# Patient Record
Sex: Female | Born: 1940
Health system: Southern US, Community
[De-identification: ages and names within clinical notes are randomized; demographics above are authoritative.]

## PROBLEM LIST (undated history)

## (undated) DIAGNOSIS — E7800 Pure hypercholesterolemia, unspecified: Secondary | ICD-10-CM

## (undated) DIAGNOSIS — H9190 Unspecified hearing loss, unspecified ear: Secondary | ICD-10-CM

## (undated) DIAGNOSIS — J449 Chronic obstructive pulmonary disease, unspecified: Secondary | ICD-10-CM

## (undated) DIAGNOSIS — J302 Other seasonal allergic rhinitis: Secondary | ICD-10-CM

## (undated) DIAGNOSIS — K219 Gastro-esophageal reflux disease without esophagitis: Secondary | ICD-10-CM

## (undated) DIAGNOSIS — M1990 Unspecified osteoarthritis, unspecified site: Secondary | ICD-10-CM

## (undated) DIAGNOSIS — R0600 Dyspnea, unspecified: Secondary | ICD-10-CM

## (undated) HISTORY — PX: MINOR HEMORRHOIDECTOMY: SHX6238

---

## 2007-06-02 ENCOUNTER — Ambulatory Visit: Payer: Self-pay

## 2007-06-15 ENCOUNTER — Ambulatory Visit: Payer: Self-pay

## 2008-08-23 ENCOUNTER — Ambulatory Visit: Payer: Self-pay

## 2008-09-07 LAB — HM PAP SMEAR: HM Pap smear: NORMAL

## 2009-08-26 ENCOUNTER — Ambulatory Visit: Payer: Self-pay | Admitting: Unknown Physician Specialty

## 2010-09-26 ENCOUNTER — Ambulatory Visit: Payer: Self-pay | Admitting: Unknown Physician Specialty

## 2011-12-16 ENCOUNTER — Ambulatory Visit: Payer: Self-pay | Admitting: Family Medicine

## 2012-07-20 LAB — HM COLONOSCOPY

## 2013-01-19 ENCOUNTER — Ambulatory Visit: Payer: Self-pay | Admitting: Family Medicine

## 2014-02-06 ENCOUNTER — Ambulatory Visit: Payer: Self-pay | Admitting: Family Medicine

## 2014-02-06 LAB — HM MAMMOGRAPHY: HM Mammogram: NORMAL

## 2014-07-15 DIAGNOSIS — H9193 Unspecified hearing loss, bilateral: Secondary | ICD-10-CM | POA: Insufficient documentation

## 2014-07-23 ENCOUNTER — Ambulatory Visit: Payer: Self-pay | Admitting: Family Medicine

## 2014-10-11 DIAGNOSIS — J449 Chronic obstructive pulmonary disease, unspecified: Secondary | ICD-10-CM | POA: Insufficient documentation

## 2014-12-10 DIAGNOSIS — K219 Gastro-esophageal reflux disease without esophagitis: Secondary | ICD-10-CM | POA: Diagnosis not present

## 2014-12-10 DIAGNOSIS — J439 Emphysema, unspecified: Secondary | ICD-10-CM | POA: Diagnosis not present

## 2014-12-10 DIAGNOSIS — J302 Other seasonal allergic rhinitis: Secondary | ICD-10-CM | POA: Diagnosis not present

## 2014-12-10 DIAGNOSIS — E785 Hyperlipidemia, unspecified: Secondary | ICD-10-CM | POA: Diagnosis not present

## 2014-12-10 DIAGNOSIS — Z1239 Encounter for other screening for malignant neoplasm of breast: Secondary | ICD-10-CM | POA: Diagnosis not present

## 2014-12-10 DIAGNOSIS — M1611 Unilateral primary osteoarthritis, right hip: Secondary | ICD-10-CM | POA: Diagnosis not present

## 2014-12-12 ENCOUNTER — Encounter: Payer: Self-pay | Admitting: Internal Medicine

## 2014-12-12 DIAGNOSIS — E782 Mixed hyperlipidemia: Secondary | ICD-10-CM | POA: Insufficient documentation

## 2014-12-12 DIAGNOSIS — M169 Osteoarthritis of hip, unspecified: Secondary | ICD-10-CM | POA: Insufficient documentation

## 2014-12-12 DIAGNOSIS — K219 Gastro-esophageal reflux disease without esophagitis: Secondary | ICD-10-CM | POA: Insufficient documentation

## 2014-12-12 DIAGNOSIS — J302 Other seasonal allergic rhinitis: Secondary | ICD-10-CM | POA: Insufficient documentation

## 2014-12-12 DIAGNOSIS — Z1239 Encounter for other screening for malignant neoplasm of breast: Secondary | ICD-10-CM | POA: Insufficient documentation

## 2015-01-25 ENCOUNTER — Other Ambulatory Visit: Payer: Self-pay | Admitting: Internal Medicine

## 2015-01-25 DIAGNOSIS — Z1231 Encounter for screening mammogram for malignant neoplasm of breast: Secondary | ICD-10-CM

## 2015-01-29 DIAGNOSIS — S39012A Strain of muscle, fascia and tendon of lower back, initial encounter: Secondary | ICD-10-CM | POA: Diagnosis not present

## 2015-01-29 DIAGNOSIS — M7071 Other bursitis of hip, right hip: Secondary | ICD-10-CM | POA: Diagnosis not present

## 2015-02-13 ENCOUNTER — Ambulatory Visit
Admission: RE | Admit: 2015-02-13 | Discharge: 2015-02-13 | Disposition: A | Discharge status: Home / Self Care | Payer: Commercial Managed Care - HMO | Source: Ambulatory Visit | Attending: Internal Medicine | Admitting: Internal Medicine

## 2015-02-13 DIAGNOSIS — Z1231 Encounter for screening mammogram for malignant neoplasm of breast: Secondary | ICD-10-CM | POA: Diagnosis not present

## 2015-03-06 ENCOUNTER — Other Ambulatory Visit: Payer: Self-pay | Admitting: Internal Medicine

## 2015-03-12 ENCOUNTER — Ambulatory Visit (INDEPENDENT_AMBULATORY_CARE_PROVIDER_SITE_OTHER): Payer: Commercial Managed Care - HMO | Admitting: Internal Medicine

## 2015-03-12 ENCOUNTER — Encounter: Payer: Self-pay | Admitting: Internal Medicine

## 2015-03-12 VITALS — BP 150/70 | HR 68 | Ht 62.0 in | Wt 182.4 lb

## 2015-03-12 DIAGNOSIS — J309 Allergic rhinitis, unspecified: Secondary | ICD-10-CM | POA: Diagnosis not present

## 2015-03-12 DIAGNOSIS — J439 Emphysema, unspecified: Secondary | ICD-10-CM | POA: Diagnosis not present

## 2015-03-12 DIAGNOSIS — Z0000 Encounter for general adult medical examination without abnormal findings: Secondary | ICD-10-CM

## 2015-03-12 DIAGNOSIS — Z1239 Encounter for other screening for malignant neoplasm of breast: Secondary | ICD-10-CM | POA: Diagnosis not present

## 2015-03-12 DIAGNOSIS — E785 Hyperlipidemia, unspecified: Secondary | ICD-10-CM

## 2015-03-12 DIAGNOSIS — K219 Gastro-esophageal reflux disease without esophagitis: Secondary | ICD-10-CM | POA: Diagnosis not present

## 2015-03-12 DIAGNOSIS — M1611 Unilateral primary osteoarthritis, right hip: Secondary | ICD-10-CM

## 2015-03-12 LAB — POCT URINALYSIS DIPSTICK
BILIRUBIN UA: NEGATIVE
Blood, UA: NEGATIVE
GLUCOSE UA: NEGATIVE
KETONES UA: NEGATIVE
Leukocytes, UA: NEGATIVE
NITRITE UA: NEGATIVE
Protein, UA: NEGATIVE
Spec Grav, UA: 1.01
Urobilinogen, UA: 0.2
pH, UA: 5

## 2015-03-12 MED ORDER — ALBUTEROL SULFATE HFA 108 (90 BASE) MCG/ACT IN AERS: 2.0000 | INHALATION_SPRAY | Freq: Four times a day (QID) | RESPIRATORY_TRACT | Status: DC | PRN

## 2015-03-12 MED ORDER — FLUTICASONE PROPIONATE 50 MCG/ACT NA SUSP: 2.0000 | Freq: Every day | NASAL | Status: DC

## 2015-03-12 MED ORDER — OMEPRAZOLE 20 MG PO CPDR: 20.0000 mg | DELAYED_RELEASE_CAPSULE | Freq: Every day | ORAL | Status: DC

## 2015-03-12 MED ORDER — FLUTICASONE-SALMETEROL 250-50 MCG/DOSE IN AEPB: 1.0000 | INHALATION_SPRAY | Freq: Two times a day (BID) | RESPIRATORY_TRACT | Status: DC

## 2015-03-12 NOTE — Progress Notes (Signed)
Patient: Monica Campbell, Female    DOB: March 04, 1941, 74 y.o.   MRN: [id number] Visit Date: 03/12/2015  Today's Provider: Halina Maidens, MD   Chief Complaint  Patient presents with  . Medicare Wellness   Subjective:    Annual wellness visit Monica Campbell is a 74 y.o. female who presents today for her Subsequent Annual Wellness Visit. She feels well. She reports exercising by walking every day and sometimes going to the gym. She reports she is sleeping well.   ----------------------------------------------------------- Hip Pain  The incident occurred more than 1 week ago. There was no injury mechanism. The pain is present in the right hip. The quality of the pain is described as aching. The pain is moderate. The pain has been intermittent since onset. Associated symptoms include a loss of motion. The symptoms are aggravated by weight bearing. She has tried NSAIDs for the symptoms.  Shortness of Breath This is a chronic problem. The current episode started more than 1 year ago. The problem occurs daily. The problem has been unchanged. Pertinent negatives include no abdominal pain, chest pain, ear pain, fever, headaches, leg swelling, rash, sore throat or swollen glands. She has tried beta agonist inhalers and steroid inhalers (using advair once a day and albuterol mdi 2-3 times per day) for the symptoms. The treatment provided moderate relief. Her past medical history is significant for COPD.  Gastrophageal Reflux She complains of belching and heartburn. She reports no abdominal pain, no chest pain, no choking, no coughing, no nausea or no sore throat. This is a recurrent problem. The problem occurs occasionally. The problem has been unchanged. The heartburn duration is several minutes. The heartburn is located in the substernum. The heartburn is of moderate intensity. The heartburn does not wake her from sleep. The heartburn does not limit her activity. The heartburn doesn't change with position.  The symptoms are aggravated by certain foods. Pertinent negatives include no fatigue or weight loss. There are no known risk factors. She has tried an antacid for the symptoms. The treatment provided mild relief.  Hyperlipidemia This is a chronic problem. The problem is uncontrolled. She has no history of diabetes. There are no known factors aggravating her hyperlipidemia. Associated symptoms include myalgias (lateral right calf after prolonged walking) and shortness of breath. Pertinent negatives include no chest pain. She is currently on no antihyperlipidemic treatment (myalgia on lovestatin so meds stopped last visit). Compliance problems include medication side effects.     Review of Systems  Constitutional: Negative for fever, weight loss, appetite change, fatigue and unexpected weight change.  HENT: Negative for ear pain, sore throat, trouble swallowing and voice change.   Eyes: Negative for visual disturbance.  Respiratory: Positive for shortness of breath. Negative for cough, choking and chest tightness.   Cardiovascular: Negative for chest pain, palpitations and leg swelling.  Gastrointestinal: Positive for heartburn. Negative for nausea, abdominal pain, diarrhea and blood in stool.  Endocrine: Negative for polydipsia and polyuria.  Genitourinary: Negative for dysuria and hematuria.  Musculoskeletal: Positive for myalgias (lateral right calf after prolonged walking) and arthralgias (right hip pain).  Skin: Negative for color change and rash.  Neurological: Negative for weakness, light-headedness and headaches.  Hematological: Negative for adenopathy.  Psychiatric/Behavioral: Negative for confusion, sleep disturbance and dysphoric mood.    History   Social History  . Marital Status: Divorced    Spouse Name: N/A  . Number of Children: N/A  . Years of Education: N/A   Occupational History  . Not on  file.   Social History Main Topics  . Smoking status: Former Smoker    Types:  Cigarettes    Quit date: 09/07/2000  . Smokeless tobacco: Not on file  . Alcohol Use: 0.6 oz/week    1 Glasses of wine per week  . Drug Use: Not on file  . Sexual Activity: Not on file   Other Topics Concern  . Not on file   Social History Narrative    Patient Active Problem List   Diagnosis Date Noted  . Allergic rhinitis, seasonal 12/12/2014  . Breast screening 12/12/2014  . Gastro-esophageal reflux disease without esophagitis 12/12/2014  . HLD (hyperlipidemia) 12/12/2014  . Degenerative arthritis of hip 12/12/2014  . Chronic obstructive pulmonary emphysema 12/12/2014  . Bilateral hearing loss 07/15/2014    Past Surgical History  Procedure Laterality Date  . Minor hemorrhoidectomy      Her family history includes Heart disease in her brother, mother, and sister.    Previous Medications   ALBUTEROL (PROVENTIL HFA;VENTOLIN HFA) 108 (90 BASE) MCG/ACT INHALER    Inhale 2 puffs into the lungs.   ALBUTEROL (PROVENTIL) (2.5 MG/3ML) 0.083% NEBULIZER SOLUTION    Inhale 3 mLs into the lungs 4 (four) times a week.   ASPIRIN 81 MG CHEWABLE TABLET    Chew 1 tablet by mouth daily.   CALCIUM CARBONATE-VIT D-MIN (CALTRATE 600+D PLUS MINERALS) 600-800 MG-UNIT CHEW    Chew 1 tablet by mouth daily.   DOCUSATE SODIUM (COLACE) 100 MG CAPSULE    Take 1 capsule by mouth daily.   FLUTICASONE (FLONASE) 50 MCG/ACT NASAL SPRAY    Place 2 sprays into the nose daily.   FLUTICASONE-SALMETEROL (ADVAIR) 250-50 MCG/DOSE AEPB    Inhale 1 puff into the lungs 2 (two) times daily.   LOVASTATIN (MEVACOR) 20 MG TABLET    Take 1 tablet by mouth at bedtime.   MULTIPLE VITAMINS PO    Take 1 tablet by mouth daily.   OMEPRAZOLE (PRILOSEC) 20 MG CAPSULE    Take 1 capsule by mouth daily.   TIOTROPIUM (SPIRIVA HANDIHALER) 18 MCG INHALATION CAPSULE    Place 1 capsule into inhaler and inhale daily.    Patient Care Team: Glean Hess, MD as PCP - General (Family Medicine)     Objective:   Vitals: BP 150/70  mmHg  Pulse 68  Ht 5\' 2"  (1.575 m)  Wt 182 lb 6.4 oz (82.736 kg)  BMI 33.35 kg/m2  Physical Exam  Constitutional: She is oriented to person, place, and time. She appears well-developed and well-nourished.  HENT:  Head: Normocephalic.  Right Ear: Tympanic membrane and ear canal normal.  Left Ear: Tympanic membrane and ear canal normal.  Nose: Nose normal. Right sinus exhibits no maxillary sinus tenderness and no frontal sinus tenderness. Left sinus exhibits no maxillary sinus tenderness and no frontal sinus tenderness.  Mouth/Throat: Uvula is midline.  Eyes: Conjunctivae and lids are normal.  Neck: Normal range of motion. Neck supple. No tracheal tenderness and no muscular tenderness present. Carotid bruit is not present. No thyromegaly present.  Cardiovascular: Normal rate, regular rhythm, normal heart sounds and intact distal pulses.   Pulmonary/Chest: Breath sounds normal. She has no decreased breath sounds. She has no wheezes.  Abdominal: Soft. Normal appearance and bowel sounds are normal. There is no hepatosplenomegaly. There is no tenderness.  Genitourinary: No breast swelling, tenderness, discharge or bleeding.  Musculoskeletal:       Right hip: She exhibits tenderness. She exhibits normal range of motion.  Legs: Lymphadenopathy:    She has no cervical adenopathy.    She has no axillary adenopathy.  Neurological: She is alert and oriented to person, place, and time. She has normal strength and normal reflexes. No cranial nerve deficit or sensory deficit.  Skin: Skin is warm, dry and intact.  Psychiatric: She has a normal mood and affect. Her speech is normal. Cognition and memory are normal.    Activities of Daily Living In your present state of health, do you have any difficulty performing the following activities: 03/12/2015  Hearing? Y  Vision? N  Difficulty concentrating or making decisions? N  Walking or climbing stairs? Y  Dressing or bathing? N  Doing errands,  shopping? N    Fall Risk Assessment Fall Risk  03/12/2015  Falls in the past year? No     Patient reports there are not safety devices in place in shower at home. She has smoke detectors at home.  Depression Screen PHQ 2/9 Scores 03/12/2015  PHQ - 2 Score 0    Cognitive Testing - 6-CIT   Correct? Score   What year is it? yes 0 Yes = 0    No = 4  What month is it? yes 0 Yes = 0    No = 3  Remember:     [place], [place], [place]     What time is it? yes 0 Yes = 0    No = 3  Count backwards from 20 to 1 yes 0 Correct = 0    1 error = 2   More than 1 error = 4  Say the months of the year in reverse. yes 0 Correct = 0    1 error = 2   More than 1 error = 4  What address did I ask you to remember? yes 2 Correct = 0  1 error = 2    2 error = 4    3 error = 6    4 error = 8    All wrong = 10       TOTAL SCORE  2/28   Interpretation:  Normal  Normal (0-7) Abnormal (8-28)        Assessment & Plan:     Annual Wellness Visit  Reviewed patient's Family Medical History Reviewed and updated list of patient's medical providers Assessment of cognitive impairment was done Assessed patient's functional ability Established a written schedule for health screening [place] Completed and Reviewed  Exercise Activities and Dietary recommendations Goals    None       There is no immunization history on file for this patient.  Health Maintenance  Topic Date Due  . ZOSTAVAX  05/30/2001  . DEXA SCAN  05/30/2006  . PNA vac Low Risk Adult (1 of 2 - PCV13) 05/30/2006  . INFLUENZA VACCINE  04/08/2015  . MAMMOGRAM  02/12/2017  . COLONOSCOPY  07/20/2022  . TETANUS/TDAP  05/17/2024     Discussed health benefits of physical activity, and encouraged her to engage in regular exercise appropriate for her age and condition.    ------------------------------------------------------------------------------------------------------------ 1. Annual physical exam -  TSH - POCT urinalysis dipstick  2. Pulmonary emphysema, unspecified emphysema type Stable but needing MDI 2-3 times per day Pt instructed to increase Advair to BID - albuterol (PROVENTIL HFA;VENTOLIN HFA) 108 (90 BASE) MCG/ACT inhaler; Inhale 2 puffs into the lungs every 6 (six) hours as needed for wheezing or shortness of breath.  Dispense:  3 each; Refill: 3 - Fluticasone-Salmeterol (ADVAIR) 250-50 MCG/DOSE AEPB; Inhale 1 puff into the lungs 2 (two) times daily.  Dispense: 3 each; Refill: 3  3. Gastro-esophageal reflux disease without esophagitis - omeprazole (PRILOSEC) 20 MG capsule; Take 1 capsule (20 mg total) by mouth daily.  Dispense: 90 capsule; Refill: 3 - Comprehensive metabolic panel - CBC with Differential/Platelet  4. Primary osteoarthritis of right hip Continue advil as needed - this will help calf pain from inflamed varicose veins as well Consider Ortho referral for hip pain if worsening  5. Breast screening Continue annual mammograms  6. HLD (hyperlipidemia) Remain off medication - will advise after labs - Lipid panel  7. Allergic rhinitis, unspecified allergic rhinitis type - fluticasone (FLONASE) 50 MCG/ACT nasal spray; Place 2 sprays into both nostrils daily.  Dispense: 48 g; Refill: [place], MD [facility]  03/12/2015

## 2015-03-12 NOTE — Patient Instructions (Addendum)
Health Maintenance  Topic Date Due  . ZOSTAVAX  Completed  . DEXA SCAN  05/30/2006  . PNA vac Low Risk Adult (1 of 2 - PCV13) Completed  . INFLUENZA VACCINE  04/08/2015  . MAMMOGRAM  02/12/2017  . COLONOSCOPY  07/20/2022  . TETANUS/TDAP  05/17/2024   ** USE ADVAIR TWICE A DAY INSTEAD OF ONCE PER DAY**  Breast Self-Awareness Practicing breast self-awareness may pick up problems early, prevent significant medical complications, and possibly save your life. By practicing breast self-awareness, you can become familiar with how your breasts look and feel and if your breasts are changing. This allows you to notice changes early. It can also offer you some reassurance that your breast health is good. One way to learn what is normal for your breasts and whether your breasts are changing is to do a breast self-exam. If you find a lump or something that was not present in the past, it is best to contact your caregiver right away. Other findings that should be evaluated by your caregiver include nipple discharge, especially if it is bloody; skin changes or reddening; areas where the skin seems to be pulled in (retracted); or new lumps and bumps. Breast pain is seldom associated with cancer (malignancy), but should also be evaluated by a caregiver. HOW TO PERFORM A BREAST SELF-EXAM The best time to examine your breasts is 5-7 days after your menstrual period is over. During menstruation, the breasts are lumpier, and it may be more difficult to pick up changes. If you do not menstruate, have reached menopause, or had your uterus removed (hysterectomy), you should examine your breasts at regular intervals, such as monthly. If you are breastfeeding, examine your breasts after a feeding or after using a breast pump. Breast implants do not decrease the risk for lumps or tumors, so continue to perform breast self-exams as recommended. Talk to your caregiver about how to determine the difference between the implant and  breast tissue. Also, talk about the amount of pressure you should use during the exam. Over time, you will become more familiar with the variations of your breasts and more comfortable with the exam. A breast self-exam requires you to remove all your clothes above the waist. 1. Look at your breasts and nipples. Stand in front of a mirror in a room with good lighting. With your hands on your hips, push your hands firmly downward. Look for a difference in shape, contour, and size from one breast to the other (asymmetry). Asymmetry includes puckers, dips, or bumps. Also, look for skin changes, such as reddened or scaly areas on the breasts. Look for nipple changes, such as discharge, dimpling, repositioning, or redness. 2. Carefully feel your breasts. This is best done either in the shower or tub while using soapy water or when flat on your back. Place the arm (on the side of the breast you are examining) above your head. Use the pads (not the fingertips) of your three middle fingers on your opposite hand to feel your breasts. Start in the underarm area and use  inch (2 cm) overlapping circles to feel your breast. Use 3 different levels of pressure (light, medium, and firm pressure) at each circle before moving to the next circle. The light pressure is needed to feel the tissue closest to the skin. The medium pressure will help to feel breast tissue a little deeper, while the firm pressure is needed to feel the tissue close to the ribs. Continue the overlapping circles, moving downward  over the breast until you feel your ribs below your breast. Then, move one finger-width towards the center of the body. Continue to use the  inch (2 cm) overlapping circles to feel your breast as you move slowly up toward the collar bone (clavicle) near the base of the neck. Continue the up and down exam using all 3 pressures until you reach the middle of the chest. Do this with each breast, carefully feeling for lumps or  changes. 3.  Keep a written record with breast changes or normal findings for each breast. By writing this information down, you do not need to depend only on memory for size, tenderness, or location. Write down where you are in your menstrual cycle, if you are still menstruating. Breast tissue can have some lumps or thick tissue. However, see your caregiver if you find anything that concerns you.  SEEK MEDICAL CARE IF:  You see a change in shape, contour, or size of your breasts or nipples.   You see skin changes, such as reddened or scaly areas on the breasts or nipples.   You have an unusual discharge from your nipples.   You feel a new lump or unusually thick areas.  Document Released: 08/24/2005 Document Revised: 08/10/2012 Document Reviewed: 12/09/2011 [facility] Patient Information 2015 [place], [place]. This information is not intended to replace advice given to you by your health care provider. Make sure you discuss any questions you have with your health care provider.

## 2015-03-13 ENCOUNTER — Other Ambulatory Visit: Payer: Self-pay | Admitting: Internal Medicine

## 2015-03-13 LAB — COMPREHENSIVE METABOLIC PANEL
A/G RATIO: 1.4 (ref 1.1–2.5)
ALT: 10 IU/L (ref 0–32)
AST: 17 IU/L (ref 0–40)
Albumin: 4 g/dL (ref 3.5–4.8)
Alkaline Phosphatase: 63 IU/L (ref 39–117)
BUN/Creatinine Ratio: 15 (ref 11–26)
BUN: 11 mg/dL (ref 8–27)
Bilirubin Total: 0.5 mg/dL (ref 0.0–1.2)
CO2: 27 mmol/L (ref 18–29)
CREATININE: 0.71 mg/dL (ref 0.57–1.00)
Calcium: 9 mg/dL (ref 8.7–10.3)
Chloride: 100 mmol/L (ref 97–108)
GFR calc non Af Amer: 85 mL/min/{1.73_m2} (ref >59)
GFR, EST AFRICAN AMERICAN: 98 mL/min/{1.73_m2} (ref >59)
GLOBULIN, TOTAL: 2.8 g/dL (ref 1.5–4.5)
Glucose: 89 mg/dL (ref 65–99)
Potassium: 4.3 mmol/L (ref 3.5–5.2)
Sodium: 141 mmol/L (ref 134–144)
Total Protein: 6.8 g/dL (ref 6.0–8.5)

## 2015-03-13 LAB — CBC WITH DIFFERENTIAL/PLATELET
BASOS ABS: 0 10*3/uL (ref 0.0–0.2)
Basos: 0 %
EOS (ABSOLUTE): 0.2 10*3/uL (ref 0.0–0.4)
Eos: 2 %
Hematocrit: 40.6 % (ref 34.0–46.6)
Hemoglobin: 13.9 g/dL (ref 11.1–15.9)
IMMATURE GRANULOCYTES: 0 %
Immature Grans (Abs): 0 10*3/uL (ref 0.0–0.1)
Lymphocytes Absolute: 3.9 10*3/uL — ABNORMAL HIGH (ref 0.7–3.1)
Lymphs: 36 %
MCH: 29.6 pg (ref 26.6–33.0)
MCHC: 34.2 g/dL (ref 31.5–35.7)
MCV: 87 fL (ref 79–97)
MONOCYTES: 9 %
Monocytes Absolute: 0.9 10*3/uL (ref 0.1–0.9)
NEUTROS ABS: 5.7 10*3/uL (ref 1.4–7.0)
Neutrophils: 53 %
PLATELETS: 289 10*3/uL (ref 150–379)
RBC: 4.69 x10E6/uL (ref 3.77–5.28)
RDW: 13.5 % (ref 12.3–15.4)
WBC: 10.8 10*3/uL (ref 3.4–10.8)

## 2015-03-13 LAB — LIPID PANEL
CHOLESTEROL TOTAL: 271 mg/dL — AB (ref 100–199)
Chol/HDL Ratio: 4.2 ratio units (ref 0.0–4.4)
HDL: 64 mg/dL (ref >39)
LDL Calculated: 181 mg/dL — ABNORMAL HIGH (ref 0–99)
Triglycerides: 128 mg/dL (ref 0–149)
VLDL CHOLESTEROL CAL: 26 mg/dL (ref 5–40)

## 2015-03-13 LAB — TSH: TSH: 4.06 u[IU]/mL (ref 0.450–4.500)

## 2015-03-13 MED ORDER — ALBUTEROL SULFATE HFA 108 (90 BASE) MCG/ACT IN AERS: 2.0000 | INHALATION_SPRAY | Freq: Four times a day (QID) | RESPIRATORY_TRACT | Status: DC | PRN

## 2015-03-13 MED ORDER — SIMVASTATIN 10 MG PO TABS: 10.0000 mg | ORAL_TABLET | Freq: Every day | ORAL | Status: DC

## 2015-05-29 ENCOUNTER — Other Ambulatory Visit: Payer: Self-pay | Admitting: Internal Medicine

## 2015-05-29 DIAGNOSIS — M1611 Unilateral primary osteoarthritis, right hip: Secondary | ICD-10-CM

## 2015-06-04 DIAGNOSIS — M5431 Sciatica, right side: Secondary | ICD-10-CM | POA: Diagnosis not present

## 2015-06-04 DIAGNOSIS — M545 Low back pain: Secondary | ICD-10-CM | POA: Diagnosis not present

## 2015-06-04 DIAGNOSIS — M7071 Other bursitis of hip, right hip: Secondary | ICD-10-CM | POA: Diagnosis not present

## 2015-07-09 ENCOUNTER — Encounter: Payer: Self-pay | Admitting: Internal Medicine

## 2015-07-09 ENCOUNTER — Ambulatory Visit (INDEPENDENT_AMBULATORY_CARE_PROVIDER_SITE_OTHER): Payer: Commercial Managed Care - HMO | Admitting: Internal Medicine

## 2015-07-09 VITALS — BP 110/60 | HR 88 | Temp 98.2°F | Ht 62.0 in | Wt 185.4 lb

## 2015-07-09 DIAGNOSIS — J439 Emphysema, unspecified: Secondary | ICD-10-CM

## 2015-07-09 DIAGNOSIS — J40 Bronchitis, not specified as acute or chronic: Secondary | ICD-10-CM | POA: Diagnosis not present

## 2015-07-09 DIAGNOSIS — L309 Dermatitis, unspecified: Secondary | ICD-10-CM | POA: Diagnosis not present

## 2015-07-09 MED ORDER — CLOBETASOL PROPIONATE 0.05 % EX CREA: 1.0000 "application " | TOPICAL_CREAM | Freq: Two times a day (BID) | CUTANEOUS | Status: DC

## 2015-07-09 MED ORDER — LEVOFLOXACIN 500 MG PO TABS: 500.0000 mg | ORAL_TABLET | Freq: Every day | ORAL | Status: DC

## 2015-07-09 MED ORDER — BENZONATATE 200 MG PO CAPS: 200.0000 mg | ORAL_CAPSULE | Freq: Three times a day (TID) | ORAL | Status: DC | PRN

## 2015-07-09 NOTE — Progress Notes (Signed)
Date:  07/09/2015   Name:  Monica Campbell   DOB:  06-24-1941   MRN:  [id number]   Chief Complaint: Bronchitis  Patient complains of onset of cough with productive phlegm. Symptoms began about 2 weeks ago and her progressive. She has sore throat and chest discomfort from coughing. She may have had some scattered wheezes as well. No fever or chills but she does have fatigue. She continues to use her Advair inhaler as well as Flonase nasal spray. Rash - patient complains of a chronic rash scattered over her back chest and arms. She seemed to separate dermatologist and had a biopsy with no definitive diagnosis. She's been using clobetasol cream for flareups. Currently flared up especially on arms and neck and requests a refill.   Review of Systems  Constitutional: Positive for chills. Negative for fever and fatigue.  HENT: Positive for sore throat. Negative for ear pain, nosebleeds and postnasal drip.   Respiratory: Positive for cough, chest tightness, shortness of breath and wheezing.   Cardiovascular: Negative for chest pain and palpitations.  Gastrointestinal: Positive for abdominal pain. Negative for vomiting and diarrhea.  Skin: Positive for rash. Negative for wound.  Psychiatric/Behavioral: Negative for confusion, sleep disturbance and dysphoric mood.    Patient Active Problem List   Diagnosis Date Noted  . Allergic rhinitis, seasonal 12/12/2014  . Breast screening 12/12/2014  . Gastro-esophageal reflux disease without esophagitis 12/12/2014  . HLD (hyperlipidemia) 12/12/2014  . Degenerative arthritis of hip 12/12/2014  . Chronic obstructive pulmonary emphysema ([facility]) 12/12/2014  . Bilateral hearing loss 07/15/2014    Prior to Admission medications   Medication Sig Start Date End Date Taking? Authorizing Provider  albuterol (PROVENTIL HFA;VENTOLIN HFA) 108 (90 BASE) MCG/ACT inhaler Inhale 2 puffs into the lungs every 6 (six) hours as needed for wheezing or shortness of breath. 03/13/15   Yes Glean Hess, MD  albuterol (PROVENTIL) (2.5 MG/3ML) 0.083% nebulizer solution Inhale 3 mLs into the lungs 4 (four) times a week.   Yes Historical Provider, MD  aspirin 81 MG chewable tablet Chew 1 tablet by mouth daily.   Yes Historical Provider, MD  Calcium Carbonate-Vit D-Min (CALTRATE 600+D PLUS MINERALS) 600-800 MG-UNIT CHEW Chew 1 tablet by mouth daily.   Yes Historical Provider, MD  docusate sodium (COLACE) 100 MG capsule Take 1 capsule by mouth daily.   Yes Historical Provider, MD  fluticasone (FLONASE) 50 MCG/ACT nasal spray Place 2 sprays into both nostrils daily. 03/12/15  Yes Glean Hess, MD  Fluticasone-Salmeterol (ADVAIR) 250-50 MCG/DOSE AEPB Inhale 1 puff into the lungs 2 (two) times daily. 03/12/15  Yes Glean Hess, MD  MULTIPLE VITAMINS PO Take 1 tablet by mouth daily.   Yes Historical Provider, MD  simvastatin (ZOCOR) 10 MG tablet Take 1 tablet (10 mg total) by mouth at bedtime. 03/13/15  Yes Glean Hess, MD  omeprazole (PRILOSEC) 20 MG capsule Take 1 capsule (20 mg total) by mouth daily. Patient not taking: Reported on 07/09/2015 03/12/15   Glean Hess, MD  tiotropium (SPIRIVA HANDIHALER) 18 MCG inhalation capsule Place 1 capsule into inhaler and inhale daily. 11/09/14 11/09/15  Historical Provider, MD    Allergies  Allergen Reactions  . Codeine Rash    Past Surgical History  Procedure Laterality Date  . Minor hemorrhoidectomy      Social History  Substance Use Topics  . Smoking status: Former Smoker    Types: Cigarettes    Quit date: 09/07/2000  . Smokeless tobacco: None  .  Alcohol Use: 0.6 oz/week    1 Glasses of wine per week     Medication list has been reviewed and updated.   Physical Exam  Constitutional: She appears well-developed and well-nourished.  HENT:  Right Ear: Tympanic membrane and ear canal normal.  Left Ear: Tympanic membrane and ear canal normal.  Nose: Nose normal. Right sinus exhibits no maxillary sinus tenderness and  no frontal sinus tenderness. Left sinus exhibits no maxillary sinus tenderness and no frontal sinus tenderness.  Mouth/Throat: Posterior oropharyngeal erythema present.  Neck: Normal range of motion. Neck supple.  Cardiovascular: Normal rate, regular rhythm and normal heart sounds.   Pulmonary/Chest: [place]ort normal. She has decreased breath sounds. She has no wheezes. She has no rhonchi.  Skin:  Scattered erythematous macules over her arms and upper back and neck  Psychiatric: She has a normal mood and affect. Her speech is normal.  Nursing note and vitals reviewed.   BP 110/60 mmHg  Pulse 88  Temp(Src) 98.2 F (36.8 C)  Ht 5\' 2"  (1.575 m)  Wt 185 lb 6.4 oz (84.097 kg)  BMI 33.90 kg/m2  SpO2 95%  Assessment and Plan: 1. Bronchitis - levofloxacin (LEVAQUIN) 500 MG tablet; Take 1 tablet (500 mg total) by mouth daily.  Dispense: 10 tablet; Refill: 0 - benzonatate (TESSALON) 200 MG capsule; Take 1 capsule (200 mg total) by mouth 3 (three) times daily as needed for cough.  Dispense: 30 capsule; Refill: 0  2. Pulmonary emphysema, unspecified emphysema type (HCC) Continue current inhalers - levofloxacin (LEVAQUIN) 500 MG tablet; Take 1 tablet (500 mg total) by mouth daily.  Dispense: 10 tablet; Refill: 0  3. Chronic dermatitis - clobetasol cream (TEMOVATE) 0.05 %; Apply 1 application topically 2 (two) times daily.  Dispense: 30 g; Refill: 0   Halina Maidens, MD [facility]  07/09/2015

## 2015-07-22 ENCOUNTER — Telehealth: Payer: Self-pay

## 2015-07-22 ENCOUNTER — Other Ambulatory Visit: Payer: Self-pay | Admitting: Internal Medicine

## 2015-07-22 DIAGNOSIS — R21 Rash and other nonspecific skin eruption: Secondary | ICD-10-CM

## 2015-07-22 NOTE — Telephone Encounter (Signed)
Patient needs referral to dermatology for rash on back of hands.dr

## 2015-07-24 DIAGNOSIS — L309 Dermatitis, unspecified: Secondary | ICD-10-CM | POA: Diagnosis not present

## 2015-08-05 ENCOUNTER — Encounter: Payer: Self-pay | Admitting: Internal Medicine

## 2015-08-05 ENCOUNTER — Ambulatory Visit (INDEPENDENT_AMBULATORY_CARE_PROVIDER_SITE_OTHER): Payer: Commercial Managed Care - HMO | Admitting: Internal Medicine

## 2015-08-05 VITALS — BP 128/64 | HR 74 | Temp 97.9°F | Ht 62.0 in | Wt 186.4 lb

## 2015-08-05 DIAGNOSIS — J439 Emphysema, unspecified: Secondary | ICD-10-CM

## 2015-08-05 DIAGNOSIS — J40 Bronchitis, not specified as acute or chronic: Secondary | ICD-10-CM | POA: Diagnosis not present

## 2015-08-05 MED ORDER — HYDROCODONE-HOMATROPINE 5-1.5 MG/5ML PO SYRP: 5.0000 mL | ORAL_SOLUTION | Freq: Four times a day (QID) | ORAL | Status: DC | PRN

## 2015-08-05 MED ORDER — AMOXICILLIN-POT CLAVULANATE 875-125 MG PO TABS: 1.0000 | ORAL_TABLET | Freq: Two times a day (BID) | ORAL | Status: DC

## 2015-08-05 NOTE — Progress Notes (Signed)
Date:  08/05/2015   Name:  Monica Campbell   DOB:  1941/02/08   MRN:  [id number]   Chief Complaint: Cough Patient was seen 3 weeks ago with a flare of COPD and bronchitis. She was treated with 10 days of levofloxacin. She continues to complain of cough but did not get Tessalon Perles due to cost. She recently felt better when she finished the 10 days of antibiotic but then gradually began to have worsening symptoms with more fatigue more cough and more sputum production. The sputum and nasal drainage is a cloudy white, not yellow or green.  Review of Systems  Constitutional: Positive for fatigue. Negative for fever, chills and diaphoresis.  HENT: Positive for postnasal drip, rhinorrhea and sore throat. Negative for sinus pressure, trouble swallowing and voice change.   Respiratory: Positive for cough, chest tightness and shortness of breath. Negative for wheezing.   Cardiovascular: Negative for chest pain and palpitations.  Gastrointestinal: Negative for vomiting and diarrhea.  Musculoskeletal: Negative for neck pain and neck stiffness.  Skin: Positive for rash (improving).  Neurological: Negative for dizziness and headaches.    Patient Active Problem List   Diagnosis Date Noted  . Chronic dermatitis 07/09/2015  . Allergic rhinitis, seasonal 12/12/2014  . Breast screening 12/12/2014  . Gastro-esophageal reflux disease without esophagitis 12/12/2014  . HLD (hyperlipidemia) 12/12/2014  . Degenerative arthritis of hip 12/12/2014  . Chronic obstructive pulmonary emphysema ([place]) 12/12/2014  . Bilateral hearing loss 07/15/2014    Prior to Admission medications   Medication Sig Start Date End Date Taking? Authorizing Provider  albuterol (PROVENTIL HFA;VENTOLIN HFA) 108 (90 BASE) MCG/ACT inhaler Inhale 2 puffs into the lungs every 6 (six) hours as needed for wheezing or shortness of breath. 03/13/15  Yes Glean Hess, MD  albuterol (PROVENTIL) (2.5 MG/3ML) 0.083% nebulizer solution Inhale  3 mLs into the lungs 4 (four) times a week.   Yes Historical Provider, MD  aspirin 81 MG chewable tablet Chew 1 tablet by mouth daily.   Yes Historical Provider, MD  Calcium Carbonate-Vit D-Min (CALTRATE 600+D PLUS MINERALS) 600-800 MG-UNIT CHEW Chew 1 tablet by mouth daily.   Yes Historical Provider, MD  clobetasol cream (TEMOVATE) [id number] % Apply 1 application topically 2 (two) times daily. 07/09/15  Yes Glean Hess, MD  docusate sodium (COLACE) 100 MG capsule Take 1 capsule by mouth daily.   Yes Historical Provider, MD  fluticasone (FLONASE) 50 MCG/ACT nasal spray Place 2 sprays into both nostrils daily. 03/12/15  Yes Glean Hess, MD  Fluticasone-Salmeterol (ADVAIR) 250-50 MCG/DOSE AEPB Inhale 1 puff into the lungs 2 (two) times daily. 03/12/15  Yes Glean Hess, MD  MULTIPLE VITAMINS PO Take 1 tablet by mouth daily.   Yes Historical Provider, MD  omeprazole (PRILOSEC) 20 MG capsule Take 1 capsule (20 mg total) by mouth daily. 03/12/15  Yes Glean Hess, MD  simvastatin (ZOCOR) 10 MG tablet Take 1 tablet (10 mg total) by mouth at bedtime. 03/13/15  Yes Glean Hess, MD    Allergies  Allergen Reactions  . Codeine Rash    Past Surgical History  Procedure Laterality Date  . Minor hemorrhoidectomy      Social History  Substance Use Topics  . Smoking status: Former Smoker    Types: Cigarettes    Quit date: 09/07/2000  . Smokeless tobacco: None  . Alcohol Use: 0.6 oz/week    1 Glasses of wine per week    Medication list has been reviewed and updated.  Physical Exam  Constitutional: She is oriented to person, place, and time. She appears well-developed and well-nourished.  Neck: Normal range of motion. Neck supple. No edema present.  Cardiovascular: Normal rate, regular rhythm, normal heart sounds and normal pulses.   Pulmonary/Chest: Effort normal. She has decreased breath sounds. She has no wheezes. She has rhonchi in the right upper field.  Neurological: She is alert  and oriented to person, place, and time.  Skin: Skin is warm and dry. Rash noted.  Psychiatric: She has a normal mood and affect. Her speech is normal.  Nursing note and vitals reviewed.   BP 128/64 mmHg  Pulse 74  Temp(Src) 97.9 F (36.6 C)  Ht 5\' 2"  (1.575 m)  Wt 186 lb 6.4 oz (84.55 kg)  BMI 34.08 kg/m2  SpO2 96%  Assessment and Plan: 1. Bronchitis Incompletely treated by Levaquin - HYDROcodone-homatropine (HYCODAN) 5-1.5 MG/5ML syrup; Take 5 mLs by mouth every 6 (six) hours as needed for cough.  Dispense: 120 mL; Refill: 0 - amoxicillin-clavulanate (AUGMENTIN) 875-125 MG tablet; Take 1 tablet by mouth 2 (two) times daily.  Dispense: 20 tablet; Refill: 0  2. Pulmonary emphysema, unspecified emphysema type ([place]) Continue inhalers and nebulizers Follow-up with Dr. Raul Del if no improvement   Halina Maidens, MD [facility]  08/05/2015

## 2015-08-18 ENCOUNTER — Encounter: Payer: Self-pay | Admitting: Emergency Medicine

## 2015-08-18 ENCOUNTER — Emergency Department: Payer: Commercial Managed Care - HMO

## 2015-08-18 ENCOUNTER — Other Ambulatory Visit: Payer: Self-pay

## 2015-08-18 ENCOUNTER — Emergency Department
Admission: EM | Admit: 2015-08-18 | Discharge: 2015-08-18 | Disposition: A | Discharge status: Home / Self Care | Payer: Commercial Managed Care - HMO | Attending: Emergency Medicine | Admitting: Emergency Medicine

## 2015-08-18 DIAGNOSIS — Z87891 Personal history of nicotine dependence: Secondary | ICD-10-CM | POA: Insufficient documentation

## 2015-08-18 DIAGNOSIS — Z7952 Long term (current) use of systemic steroids: Secondary | ICD-10-CM | POA: Insufficient documentation

## 2015-08-18 DIAGNOSIS — R0789 Other chest pain: Secondary | ICD-10-CM | POA: Diagnosis not present

## 2015-08-18 DIAGNOSIS — R1011 Right upper quadrant pain: Secondary | ICD-10-CM | POA: Insufficient documentation

## 2015-08-18 DIAGNOSIS — Z7982 Long term (current) use of aspirin: Secondary | ICD-10-CM | POA: Insufficient documentation

## 2015-08-18 DIAGNOSIS — Z7951 Long term (current) use of inhaled steroids: Secondary | ICD-10-CM | POA: Insufficient documentation

## 2015-08-18 DIAGNOSIS — R110 Nausea: Secondary | ICD-10-CM | POA: Insufficient documentation

## 2015-08-18 DIAGNOSIS — R109 Unspecified abdominal pain: Secondary | ICD-10-CM

## 2015-08-18 DIAGNOSIS — R1010 Upper abdominal pain, unspecified: Secondary | ICD-10-CM | POA: Diagnosis not present

## 2015-08-18 DIAGNOSIS — Z79899 Other long term (current) drug therapy: Secondary | ICD-10-CM | POA: Insufficient documentation

## 2015-08-18 DIAGNOSIS — Z792 Long term (current) use of antibiotics: Secondary | ICD-10-CM | POA: Diagnosis not present

## 2015-08-18 DIAGNOSIS — R079 Chest pain, unspecified: Secondary | ICD-10-CM | POA: Diagnosis not present

## 2015-08-18 HISTORY — DX: Chronic obstructive pulmonary disease, unspecified: J44.9

## 2015-08-18 LAB — BASIC METABOLIC PANEL
Anion gap: 6 (ref 5–15)
BUN: 12 mg/dL (ref 6–20)
CALCIUM: 8.8 mg/dL — AB (ref 8.9–10.3)
CO2: 27 mmol/L (ref 22–32)
CREATININE: 0.73 mg/dL (ref 0.44–1.00)
Chloride: 104 mmol/L (ref 101–111)
GFR calc Af Amer: >60 mL/min (ref >60)
GFR calc non Af Amer: >60 mL/min (ref >60)
GLUCOSE: 98 mg/dL (ref 65–99)
Potassium: 3.8 mmol/L (ref 3.5–5.1)
Sodium: 137 mmol/L (ref 135–145)

## 2015-08-18 LAB — CBC
HCT: 41.7 % (ref 35.0–47.0)
Hemoglobin: 14.2 g/dL (ref 12.0–16.0)
MCH: 29.6 pg (ref 26.0–34.0)
MCHC: 34.1 g/dL (ref 32.0–36.0)
MCV: 87 fL (ref 80.0–100.0)
PLATELETS: 247 10*3/uL (ref 150–440)
RBC: 4.79 MIL/uL (ref 3.80–5.20)
RDW: 13.2 % (ref 11.5–14.5)
WBC: 10.9 10*3/uL (ref 3.6–11.0)

## 2015-08-18 LAB — HEPATIC FUNCTION PANEL
ALT: 12 U/L — ABNORMAL LOW (ref 14–54)
AST: 17 U/L (ref 15–41)
Albumin: 4 g/dL (ref 3.5–5.0)
Alkaline Phosphatase: 54 U/L (ref 38–126)
BILIRUBIN TOTAL: 0.8 mg/dL (ref 0.3–1.2)
Total Protein: 7.8 g/dL (ref 6.5–8.1)

## 2015-08-18 LAB — TROPONIN I: Troponin I: <0.03 ng/mL (ref <0.031)

## 2015-08-18 LAB — LIPASE, BLOOD: LIPASE: 19 U/L (ref 11–51)

## 2015-08-18 MED ORDER — FAMOTIDINE 20 MG PO TABS
ORAL_TABLET | ORAL | Status: AC
  Administered 2015-08-18: 40 mg via ORAL
  Filled 2015-08-18: qty 2

## 2015-08-18 MED ORDER — ASPIRIN 81 MG PO CHEW
324.0000 mg | CHEWABLE_TABLET | Freq: Once | ORAL | Status: AC
  Administered 2015-08-18: 324 mg via ORAL
  Filled 2015-08-18: qty 4

## 2015-08-18 MED ORDER — ALUM & MAG HYDROXIDE-SIMETH 200-200-20 MG/5ML PO SUSP
ORAL | Status: AC
  Administered 2015-08-18: 30 mL via ORAL
  Filled 2015-08-18: qty 30

## 2015-08-18 MED ORDER — FAMOTIDINE 20 MG PO TABS
40.0000 mg | ORAL_TABLET | Freq: Once | ORAL | Status: AC
  Administered 2015-08-18: 40 mg via ORAL

## 2015-08-18 MED ORDER — ALUM & MAG HYDROXIDE-SIMETH 200-200-20 MG/5ML PO SUSP
30.0000 mL | Freq: Once | ORAL | Status: AC
  Administered 2015-08-18: 30 mL via ORAL

## 2015-08-18 NOTE — ED Provider Notes (Signed)
[facility] Emergency Department Provider Note REMINDER - THIS NOTE IS NOT A FINAL MEDICAL RECORD UNTIL IT IS SIGNED. UNTIL THEN, THE CONTENT BELOW MAY REFLECT INFORMATION FROM A DOCUMENTATION TEMPLATE, NOT THE ACTUAL PATIENT VISIT. ____________________________________________  Time seen: Approximately 2:40 PM  I have reviewed the triage vital signs and the nursing notes.   HISTORY  Chief Complaint Chest Pain    HPI Monica Campbell is a 74 y.o. female previous history of COPD. Also former smoker quit about 15 years ago.  She denies any personal history of cardiac disease.  Patient resents today says for about the last year she had episodes she uses intermittent and severe sharp pain that will come and go, usually lasting no more than 20-30 minutes. It is often seated just below the rib cage, and occasionally feels like a wrapping discomfort or tightness around the chest and into the upper stomach and back.  This morning she awoke from bed at about 2 AM with recurrence of this pain. She presently denies any discomfort, and all of her symptoms resolved about 3 AM. She does reports she's had a previous stress test with Dr. Ubaldo Glassing that she was told was normal a few years ago.  No shortness of breath  Past Medical History  Diagnosis Date  . COPD (chronic obstructive pulmonary disease) [facility])     Patient Active Problem List   Diagnosis Date Noted  . Pulmonary emphysema ([place]) 08/05/2015  . Chronic dermatitis 07/09/2015  . Allergic rhinitis, seasonal 12/12/2014  . Breast screening 12/12/2014  . Gastro-esophageal reflux disease without esophagitis 12/12/2014  . HLD (hyperlipidemia) 12/12/2014  . Degenerative arthritis of hip 12/12/2014  . Bilateral hearing loss 07/15/2014    Past Surgical History  Procedure Laterality Date  . Minor hemorrhoidectomy      Current Outpatient Rx  Name  Route  Sig  Dispense  Refill  . albuterol (PROVENTIL HFA;VENTOLIN HFA)  108 (90 BASE) MCG/ACT inhaler   Inhalation   Inhale 2 puffs into the lungs every 6 (six) hours as needed for wheezing or shortness of breath.   3 Inhaler   3   . albuterol (PROVENTIL) (2.5 MG/3ML) 0.083% nebulizer solution   Inhalation   Inhale 3 mLs into the lungs 4 (four) times a week.         Marland Kitchen amoxicillin-clavulanate (AUGMENTIN) 875-125 MG tablet   Oral   Take 1 tablet by mouth 2 (two) times daily.   20 tablet   0   . aspirin 81 MG chewable tablet   Oral   Chew 1 tablet by mouth daily.         . Calcium Carbonate-Vit D-Min (CALTRATE 600+D PLUS MINERALS) 600-800 MG-UNIT CHEW   Oral   Chew 1 tablet by mouth daily.         . clobetasol cream (TEMOVATE) 0.05 %   Topical   Apply 1 application topically 2 (two) times daily.   30 g   0   . docusate sodium (COLACE) 100 MG capsule   Oral   Take 1 capsule by mouth daily.         . fluticasone (FLONASE) 50 MCG/ACT nasal spray   Each Nare   Place 2 sprays into both nostrils daily.   48 g   3   . Fluticasone-Salmeterol (ADVAIR) 250-50 MCG/DOSE AEPB   Inhalation   Inhale 1 puff into the lungs 2 (two) times daily.   3 each   3   . HYDROcodone-homatropine (HYCODAN)  5-1.5 MG/5ML syrup   Oral   Take 5 mLs by mouth every 6 (six) hours as needed for cough.   120 mL   0   . MULTIPLE VITAMINS PO   Oral   Take 1 tablet by mouth daily.         Marland Kitchen omeprazole (PRILOSEC) 20 MG capsule   Oral   Take 1 capsule (20 mg total) by mouth daily.   90 capsule   3   . simvastatin (ZOCOR) 10 MG tablet   Oral   Take 1 tablet (10 mg total) by mouth at bedtime.   90 tablet   3     Allergies Codeine  Family History  Problem Relation Age of Onset  . Heart disease Mother   . Heart disease Sister   . Heart disease Brother     Social History Social History  Substance Use Topics  . Smoking status: Former Smoker    Types: Cigarettes    Quit date: 09/07/2000  . Smokeless tobacco: Not on file  . Alcohol Use: 0.6  oz/week    1 Glasses of wine per week    Review of Systems Constitutional: No fever/chills Eyes: No visual changes. ENT: No sore throat. Cardiovascular: See history of present illness Respiratory: Denies shortness of breath. Gastrointestinal: No abdominal pain, though occasionally this pain does seem like it's in the right upper abdomen tends to start right around the lower edge of the breast bone. She did have some nausea this morning.  No diarrhea.  No constipation. Genitourinary: Negative for dysuria. Musculoskeletal: Negative for back pain. Skin: Negative for rash. Neurological: Negative for headaches, focal weakness or numbness.  No leg swelling.  10-point ROS otherwise negative.  ____________________________________________   PHYSICAL EXAM:  VITAL SIGNS: ED Triage Vitals  Enc Vitals Group     BP 08/18/15 1150 124/53 mmHg     Pulse Rate 08/18/15 1150 76     Resp 08/18/15 1150 18     Temp 08/18/15 1150 98.2 F (36.8 C)     Temp Source 08/18/15 1150 Oral     SpO2 08/18/15 1150 95 %     Weight 08/18/15 1150 178 lb (80.74 kg)     Height --      Head Cir --      Peak Flow --      Pain Score --      Pain Loc --      Pain Edu? --      Excl. in [place]? --    Constitutional: Alert and oriented. Well appearing and in no acute distress. Eyes: Conjunctivae are normal. PERRL. EOMI. Head: Atraumatic. Nose: No congestion/rhinnorhea. Mouth/Throat: Mucous membranes are moist.  Oropharynx non-erythematous. Neck: No stridor.   Cardiovascular: Normal rate, regular rhythm. Grossly normal heart sounds.  Good peripheral circulation. Respiratory: Normal respiratory effort.  No retractions. Lungs CTAB. Gastrointestinal: Soft and nontender. Negative Murphy. No distention. No abdominal bruits. No CVA tenderness. Musculoskeletal: No lower extremity tenderness nor edema.  No joint effusions. Neurologic:  Normal speech and language. No gross focal neurologic deficits are appreciated. No gait  instability. Skin:  Skin is warm, dry and intact. No rash noted. Psychiatric: Mood and affect are normal. Speech and behavior are normal.  ____________________________________________   LABS (all labs ordered are listed, but only abnormal results are displayed)  Labs Reviewed  BASIC METABOLIC PANEL - Abnormal; Notable for the following:    Calcium 8.8 (*)    All other components within normal limits  HEPATIC FUNCTION PANEL - Abnormal; Notable for the following:    ALT 12 (*)    Bilirubin, Direct <0.1 (*)    All other components within normal limits  CBC  TROPONIN I  TROPONIN I  LIPASE, BLOOD   ____________________________________________  EKG  Reviewed and interpreted by me at noon Ventricular rate 75 QRS 82 QTc 4:30 Normal sinus rhythm, single PVC. Completely normal T waves without any ischemic abnormality. ____________________________________________  [id number]  DG Chest 2 View (Final result) Result time: 08/18/15 13:51:45   Final result by Rad Results In Interface (08/18/15 13:51:45)   Narrative:   CLINICAL DATA: Intermittent chest pain for 1 year  EXAM: CHEST 2 VIEW  COMPARISON: None.  FINDINGS: The heart size and mediastinal contours are within normal limits. Both lungs are clear. The visualized skeletal structures are unremarkable.  IMPRESSION: No active cardiopulmonary disease.   US Abdomen Limited RUQ (Final result) Result time: 08/18/15 15:54:48   Final result by Rad Results In Interface (08/18/15 15:54:48)   Narrative:   CLINICAL DATA: Acute upper abdominal pain.  EXAM: US ABDOMEN LIMITED - RIGHT UPPER QUADRANT  COMPARISON: None.  FINDINGS: Gallbladder:  No gallstones or wall thickening visualized. No sonographic Murphy sign noted.  Common bile duct:  Diameter: 5 mm which is within normal limits.  Liver:  No focal lesion identified. Increased echogenicity of hepatic parenchyma is noted suggesting fatty  infiltration.  IMPRESSION: Fatty infiltration of the liver. No other abnormality seen in the right upper quadrant of the abdomen.   Electronically Signed By: Marijo Conception, M.D. On: 08/18/2015 15:54       ____________________________________________   PROCEDURES  Procedure(s) performed: None  Critical Care performed: No  ____________________________________________   INITIAL IMPRESSION / ASSESSMENT AND PLAN / ED COURSE  Pertinent labs & imaging results that were available during my care of the patient were reviewed by me and considered in my medical decision making (see chart for details).  Patient presents for evaluation of a sharp chest pain which she is expressing off and on for over a year. Today she had an episode that was severe last about 30 minutes early this morning. Her symptoms are very atypical of cardiac disease, her EKG is normal and troponin is negative. Her heart score is 3, she does have 2 risk factors and her age screen and 25. Low suspicion based on the clinical history given.  Patient does also report a component of this pain as starting in the upper abdomen radiating up into the chest at times, will obtain ultrasound to evaluate for the possibility of cholelithiasis or biliary colic.  ----------------------------------------- 3:15 PM on 08/18/2015 -----------------------------------------  Repeat EKG obtained for chest pain observation, normal sinus rhythm Heart rate 70 QRS 90 QTc 440 Normal T waves. Normal sinus rhythm, normal EKG  ----------------------------------------- 4:35 PM on 08/18/2015 -----------------------------------------  Patient remains stable, no distress. Ultrasound no evidence of cholelithiasis. Patient asymptomatic, discussed case and care with Dr. Josefa Half of cardiology who recommends outpatient follow-up. Patient has had symptoms off and on for a year, nothing to indicate an acute coronary syndrome at this time. Her  symptoms very atypical, sharp, and very intermittent in nature. Plan to discharge patient home, careful return precautions and close follow-up early this week with cardiology and advised. Patient agreeable. ____________________________________________   FINAL CLINICAL IMPRESSION(S) / ED DIAGNOSES  Final diagnoses:  Abdominal pain  Atypical chest pain      Delman Kitten, MD 08/18/15 [phone]

## 2015-08-18 NOTE — ED Notes (Signed)
Patient transported to Ultrasound 

## 2015-08-18 NOTE — Discharge Instructions (Signed)
You have been seen in the Emergency Department (ED) today for chest pain.  As we have discussed today’s test results are normal, but you may require further testing. ° °Please follow up with the recommended doctor as instructed above in these documents regarding today’s emergent visit and your recent symptoms to discuss further management.  Continue to take your regular medications. If you are not doing so already, please also take a daily baby aspirin (81 mg), at least until you follow up with your doctor. ° °Return to the Emergency Department (ED) if you experience any further chest pain/pressure/tightness, difficulty breathing, or sudden sweating, or other symptoms that concern you. ° ° °Chest Pain Observation °It is often hard to give a specific diagnosis for the cause of chest pain. Among other possibilities your symptoms might be caused by inadequate oxygen delivery to your heart (angina). Angina that is not treated or evaluated can lead to a heart attack (myocardial infarction) or death. °Blood tests, electrocardiograms, and X-rays may have been done to help determine a possible cause of your chest pain. After evaluation and observation, your health care provider has determined that it is unlikely your pain was caused by an unstable condition that requires hospitalization. However, a full evaluation of your pain may need to be completed, with additional diagnostic testing as directed. It is very important to keep your follow-up appointments. Not keeping your follow-up appointments could result in permanent heart damage, disability, or death. If there is any problem keeping your follow-up appointments, you must call your health care provider. °HOME CARE INSTRUCTIONS  °Due to the slight chance that your pain could be angina, it is important to follow your health care provider's treatment plan and also maintain a healthy lifestyle: °· Maintain or work toward achieving a healthy weight. °· Stay physically active  and exercise regularly. °· Decrease your salt intake. °· Eat a balanced, healthy diet. Talk to a dietitian to learn about heart-healthy foods. °· Increase your fiber intake by including whole grains, vegetables, fruits, and nuts in your diet. °· Avoid situations that cause stress, anger, or depression. °· Take medicines as advised by your health care provider. Report any side effects to your health care provider. Do not stop medicines or adjust the dosages on your own. °· Quit smoking. Do not use nicotine patches or gum until you check with your health care provider. °· Keep your blood pressure, blood sugar, and cholesterol levels within normal limits. °· Limit alcohol intake to no more than 1 drink per day for women who are not pregnant and 2 drinks per day for men. °· Do not abuse drugs. °SEEK IMMEDIATE MEDICAL CARE IF: °You have severe chest pain or pressure which may include symptoms such as: °· You feel pain or pressure in your arms, neck, jaw, or back. °· You have severe back or abdominal pain, feel sick to your stomach (nauseous), or throw up (vomit). °· You are sweating profusely. °· You are having a fast or irregular heartbeat. °· You feel short of breath while at rest. °· You notice increasing shortness of breath during rest, sleep, or with activity. °· You have chest pain that does not get better after rest or after taking your usual medicine. °· You wake from sleep with chest pain. °· You are unable to sleep because you cannot breathe. °· You develop a frequent cough or you are coughing up blood. °· You feel dizzy, faint, or experience extreme fatigue. °· You develop severe weakness, dizziness, fainting,   or chills. °Any of these symptoms may represent a serious problem that is an emergency. Do not wait to see if the symptoms will go away. Call your local emergency services (911 in the [place] Do not drive yourself to the hospital. °MAKE SURE YOU: °· Understand these instructions. °· Will watch your  condition. °· Will get help right away if you are not doing well or get worse. °  °This information is not intended to replace advice given to you by your health care provider. Make sure you discuss any questions you have with your health care provider. °  °Document Released: 09/26/2010 Document Revised: 08/29/2013 Document Reviewed: 02/23/2013 °Elsevier Interactive Patient Education ©2016 [employer] ° °

## 2015-08-18 NOTE — ED Notes (Signed)
Pt c/o sharp intermittent epigastric pains.  Dr Jacqualine Code notified.

## 2015-08-18 NOTE — ED Notes (Signed)
Pt reports intermittent chest pain for over a year.  Here today because it felt worse around 2:30 am.  Pain is mostly gone now.

## 2015-08-19 ENCOUNTER — Other Ambulatory Visit: Payer: Self-pay | Admitting: Internal Medicine

## 2015-08-19 DIAGNOSIS — R079 Chest pain, unspecified: Secondary | ICD-10-CM

## 2015-08-21 DIAGNOSIS — R0602 Shortness of breath: Secondary | ICD-10-CM | POA: Diagnosis not present

## 2015-08-21 DIAGNOSIS — J439 Emphysema, unspecified: Secondary | ICD-10-CM | POA: Diagnosis not present

## 2015-08-21 DIAGNOSIS — R0789 Other chest pain: Secondary | ICD-10-CM | POA: Diagnosis not present

## 2015-08-21 DIAGNOSIS — E782 Mixed hyperlipidemia: Secondary | ICD-10-CM | POA: Diagnosis not present

## 2015-08-21 DIAGNOSIS — J42 Unspecified chronic bronchitis: Secondary | ICD-10-CM | POA: Diagnosis not present

## 2015-08-26 ENCOUNTER — Telehealth: Payer: Self-pay

## 2015-08-26 NOTE — Telephone Encounter (Signed)
Done/dr 

## 2015-09-06 DIAGNOSIS — R0602 Shortness of breath: Secondary | ICD-10-CM | POA: Diagnosis not present

## 2015-09-06 DIAGNOSIS — R0789 Other chest pain: Secondary | ICD-10-CM | POA: Diagnosis not present

## 2015-09-12 ENCOUNTER — Ambulatory Visit: Payer: Commercial Managed Care - HMO | Admitting: Internal Medicine

## 2015-09-12 DIAGNOSIS — J42 Unspecified chronic bronchitis: Secondary | ICD-10-CM | POA: Diagnosis not present

## 2015-09-12 DIAGNOSIS — E782 Mixed hyperlipidemia: Secondary | ICD-10-CM | POA: Diagnosis not present

## 2015-09-12 DIAGNOSIS — R0789 Other chest pain: Secondary | ICD-10-CM | POA: Diagnosis not present

## 2015-09-12 DIAGNOSIS — J439 Emphysema, unspecified: Secondary | ICD-10-CM | POA: Diagnosis not present

## 2015-10-14 ENCOUNTER — Telehealth: Payer: Self-pay

## 2015-10-14 ENCOUNTER — Other Ambulatory Visit: Payer: Self-pay | Admitting: Internal Medicine

## 2015-10-14 DIAGNOSIS — H9190 Unspecified hearing loss, unspecified ear: Secondary | ICD-10-CM

## 2015-10-14 NOTE — Telephone Encounter (Signed)
Patient called in stating that she would like a referral to [facility]NT with [facility] for a hearing test. She states that you and her have discussed this before.

## 2015-10-22 DIAGNOSIS — R69 Illness, unspecified: Secondary | ICD-10-CM | POA: Diagnosis not present

## 2016-03-05 ENCOUNTER — Other Ambulatory Visit: Payer: Self-pay | Admitting: Internal Medicine

## 2016-03-05 DIAGNOSIS — Z1231 Encounter for screening mammogram for malignant neoplasm of breast: Secondary | ICD-10-CM

## 2016-03-12 ENCOUNTER — Ambulatory Visit (INDEPENDENT_AMBULATORY_CARE_PROVIDER_SITE_OTHER): Payer: Commercial Managed Care - HMO | Admitting: Internal Medicine

## 2016-03-12 ENCOUNTER — Encounter: Payer: Self-pay | Admitting: Internal Medicine

## 2016-03-12 VITALS — BP 118/80 | HR 79 | Resp 16 | Ht 62.0 in | Wt 178.0 lb

## 2016-03-12 DIAGNOSIS — K219 Gastro-esophageal reflux disease without esophagitis: Secondary | ICD-10-CM

## 2016-03-12 DIAGNOSIS — Z0000 Encounter for general adult medical examination without abnormal findings: Secondary | ICD-10-CM

## 2016-03-12 DIAGNOSIS — M8580 Other specified disorders of bone density and structure, unspecified site: Secondary | ICD-10-CM | POA: Diagnosis not present

## 2016-03-12 DIAGNOSIS — E785 Hyperlipidemia, unspecified: Secondary | ICD-10-CM | POA: Diagnosis not present

## 2016-03-12 DIAGNOSIS — J439 Emphysema, unspecified: Secondary | ICD-10-CM

## 2016-03-12 DIAGNOSIS — J309 Allergic rhinitis, unspecified: Secondary | ICD-10-CM

## 2016-03-12 LAB — POCT URINALYSIS DIPSTICK
BILIRUBIN UA: NEGATIVE
GLUCOSE UA: NEGATIVE
Ketones, UA: NEGATIVE
Leukocytes, UA: NEGATIVE
Nitrite, UA: NEGATIVE
Protein, UA: NEGATIVE
RBC UA: NEGATIVE
SPEC GRAV UA: 1.01
pH, UA: 5

## 2016-03-12 MED ORDER — FLUTICASONE-SALMETEROL 250-50 MCG/DOSE IN AEPB: 1.0000 | INHALATION_SPRAY | Freq: Two times a day (BID) | RESPIRATORY_TRACT | Status: DC

## 2016-03-12 MED ORDER — ALBUTEROL SULFATE (2.5 MG/3ML) 0.083% IN NEBU: 3.0000 mL | INHALATION_SOLUTION | RESPIRATORY_TRACT | Status: DC

## 2016-03-12 MED ORDER — OMEPRAZOLE 20 MG PO CPDR
20.0000 mg | DELAYED_RELEASE_CAPSULE | Freq: Every day | ORAL | Status: DC
Start: 2016-03-12 — End: 2018-03-22

## 2016-03-12 MED ORDER — FLUTICASONE PROPIONATE 50 MCG/ACT NA SUSP: 2.0000 | Freq: Every day | NASAL | Status: DC

## 2016-03-12 MED ORDER — SIMVASTATIN 10 MG PO TABS: 10.0000 mg | ORAL_TABLET | Freq: Every day | ORAL | Status: DC

## 2016-03-12 MED ORDER — ALBUTEROL SULFATE HFA 108 (90 BASE) MCG/ACT IN AERS: 2.0000 | INHALATION_SPRAY | Freq: Four times a day (QID) | RESPIRATORY_TRACT | Status: DC | PRN

## 2016-03-12 NOTE — Patient Instructions (Addendum)
Health Maintenance  Topic Date Due  . DEXA SCAN  05/30/2006  . MAMMOGRAM  02/13/2016  . INFLUENZA VACCINE  04/07/2016  . COLONOSCOPY  07/20/2022  . TETANUS/TDAP  05/17/2024  . ZOSTAVAX  Completed  . PNA vac Low Risk Adult  Completed   Breast Self-Awareness Practicing breast self-awareness may pick up problems early, prevent significant medical complications, and possibly save your life. By practicing breast self-awareness, you can become familiar with how your breasts look and feel and if your breasts are changing. This allows you to notice changes early. It can also offer you some reassurance that your breast health is good. One way to learn what is normal for your breasts and whether your breasts are changing is to do a breast self-exam. If you find a lump or something that was not present in the past, it is best to contact your caregiver right away. Other findings that should be evaluated by your caregiver include nipple discharge, especially if it is bloody; skin changes or reddening; areas where the skin seems to be pulled in (retracted); or new lumps and bumps. Breast pain is seldom associated with cancer (malignancy), but should also be evaluated by a caregiver. HOW TO PERFORM A BREAST SELF-EXAM The best time to examine your breasts is 5-7 days after your menstrual period is over. During menstruation, the breasts are lumpier, and it may be more difficult to pick up changes. If you do not menstruate, have reached menopause, or had your uterus removed (hysterectomy), you should examine your breasts at regular intervals, such as monthly. If you are breastfeeding, examine your breasts after a feeding or after using a breast pump. Breast implants do not decrease the risk for lumps or tumors, so continue to perform breast self-exams as recommended. Talk to your caregiver about how to determine the difference between the implant and breast tissue. Also, talk about the amount of pressure you should use  during the exam. Over time, you will become more familiar with the variations of your breasts and more comfortable with the exam. A breast self-exam requires you to remove all your clothes above the waist. 1. Look at your breasts and nipples. Stand in front of a mirror in a room with good lighting. With your hands on your hips, push your hands firmly downward. Look for a difference in shape, contour, and size from one breast to the other (asymmetry). Asymmetry includes puckers, dips, or bumps. Also, look for skin changes, such as reddened or scaly areas on the breasts. Look for nipple changes, such as discharge, dimpling, repositioning, or redness. 2. Carefully feel your breasts. This is best done either in the shower or tub while using soapy water or when flat on your back. Place the arm (on the side of the breast you are examining) above your head. Use the pads (not the fingertips) of your three middle fingers on your opposite hand to feel your breasts. Start in the underarm area and use  inch (2 cm) overlapping circles to feel your breast. Use 3 different levels of pressure (light, medium, and firm pressure) at each circle before moving to the next circle. The light pressure is needed to feel the tissue closest to the skin. The medium pressure will help to feel breast tissue a little deeper, while the firm pressure is needed to feel the tissue close to the ribs. Continue the overlapping circles, moving downward over the breast until you feel your ribs below your breast. Then, move one finger-width towards  the center of the body. Continue to use the  inch (2 cm) overlapping circles to feel your breast as you move slowly up toward the collar bone (clavicle) near the base of the neck. Continue the up and down exam using all 3 pressures until you reach the middle of the chest. Do this with each breast, carefully feeling for lumps or changes. 3.  Keep a written record with breast changes or normal findings for  each breast. By writing this information down, you do not need to depend only on memory for size, tenderness, or location. Write down where you are in your menstrual cycle, if you are still menstruating. Breast tissue can have some lumps or thick tissue. However, see your caregiver if you find anything that concerns you.  SEEK MEDICAL CARE IF:  You see a change in shape, contour, or size of your breasts or nipples.   You see skin changes, such as reddened or scaly areas on the breasts or nipples.   You have an unusual discharge from your nipples.   You feel a new lump or unusually thick areas.    This information is not intended to replace advice given to you by your health care provider. Make sure you discuss any questions you have with your health care provider.   Document Released: 08/24/2005 Document Revised: 08/10/2012 Document Reviewed: 12/09/2011 Elsevier Interactive Patient Education [employer].

## 2016-03-12 NOTE — Progress Notes (Signed)
Patient: Monica Campbell, Female    DOB: [birth year], 75 y.o.   MRN: [id number] Visit Date: 03/12/2016  Today's Provider: Halina Maidens, MD   Chief Complaint  Patient presents with  . Medicare Wellness  . Hyperlipidemia  . Gastroesophageal Reflux   Subjective:    Annual wellness visit Monica Campbell is a 75 y.o. female who presents today for her Subsequent Annual Wellness Visit. She feels well. She reports exercising walking. She reports she is sleeping well.   ----------------------------------------------------------- Hyperlipidemia This is a chronic problem. The current episode started more than 1 year ago. The problem is controlled. Recent lipid tests were reviewed and are normal. Pertinent negatives include no chest pain or shortness of breath. Current antihyperlipidemic treatment includes statins.  Gastroesophageal Reflux She reports no abdominal pain, no chest pain, no coughing or no wheezing. This is a chronic problem. The current episode started more than 1 year ago. The problem occurs rarely. The problem has been unchanged. Pertinent negatives include no fatigue. She has tried a PPI for the symptoms. The treatment provided significant relief.  COPD - stable; no recent exacerbations.  Using Advair daily and albuterol as needed.  Review of Systems  Constitutional: Negative for fever, chills and fatigue.  HENT: Positive for hearing loss. Negative for congestion, tinnitus, trouble swallowing and voice change.   Eyes: Negative for visual disturbance.  Respiratory: Negative for cough, chest tightness, shortness of breath and wheezing.   Cardiovascular: Negative for chest pain, palpitations and leg swelling.  Gastrointestinal: Negative for vomiting, abdominal pain, diarrhea and constipation.  Endocrine: Negative for polydipsia and polyuria.  Genitourinary: Negative for dysuria, frequency, vaginal bleeding, vaginal discharge and genital sores.  Musculoskeletal: Negative for  joint swelling, arthralgias and gait problem.  Skin: Negative for color change and rash.  Neurological: Negative for dizziness, tremors, light-headedness and headaches.  Hematological: Negative for adenopathy. Does not bruise/bleed easily.  Psychiatric/Behavioral: Negative for sleep disturbance and dysphoric mood. The patient is not nervous/anxious.     Social History   Social History  . Marital Status: Divorced    Spouse Name: N/A  . Number of Children: N/A  . Years of Education: N/A   Occupational History  . Not on file.   Social History Main Topics  . Smoking status: Former Smoker    Types: Cigarettes    Quit date: 09/07/2000  . Smokeless tobacco: Not on file  . Alcohol Use: 0.6 oz/week    1 Glasses of wine per week  . Drug Use: Not on file  . Sexual Activity: Not on file   Other Topics Concern  . Not on file   Social History Narrative    Patient Active Problem List   Diagnosis Date Noted  . Pulmonary emphysema ([place]) 08/05/2015  . Chronic dermatitis 07/09/2015  . Allergic rhinitis, seasonal 12/12/2014  . Breast screening 12/12/2014  . Gastro-esophageal reflux disease without esophagitis 12/12/2014  . HLD (hyperlipidemia) 12/12/2014  . Degenerative arthritis of hip 12/12/2014  . Bilateral hearing loss 07/15/2014    Past Surgical History  Procedure Laterality Date  . Minor hemorrhoidectomy      Her family history includes Heart disease in her brother, mother, and sister.    Previous Medications   ALBUTEROL (PROVENTIL HFA;VENTOLIN HFA) 108 (90 BASE) MCG/ACT INHALER    Inhale 2 puffs into the lungs every 6 (six) hours as needed for wheezing or shortness of breath.   ALBUTEROL (PROVENTIL) (2.5 MG/3ML) 0.083% NEBULIZER SOLUTION    Inhale 3 mLs into the  lungs 4 (four) times a week.   ASPIRIN 81 MG CHEWABLE TABLET    Chew 1 tablet by mouth daily.   CALCIUM CARBONATE-VIT D-MIN (CALTRATE 600+D PLUS MINERALS) 600-800 MG-UNIT CHEW    Chew 1 tablet by mouth daily.    CLOBETASOL CREAM (TEMOVATE) 0.05 %    Apply 1 application topically 2 (two) times daily.   DOCUSATE SODIUM (COLACE) 100 MG CAPSULE    Take 1 capsule by mouth daily.   FLUTICASONE (FLONASE) 50 MCG/ACT NASAL SPRAY    Place 2 sprays into both nostrils daily.   FLUTICASONE-SALMETEROL (ADVAIR) 250-50 MCG/DOSE AEPB    Inhale 1 puff into the lungs 2 (two) times daily.   MULTIPLE VITAMINS PO    Take 1 tablet by mouth daily.   OMEPRAZOLE (PRILOSEC) 20 MG CAPSULE    Take 1 capsule (20 mg total) by mouth daily.   SIMVASTATIN (ZOCOR) 10 MG TABLET    Take 1 tablet (10 mg total) by mouth at bedtime.    Patient Care Team: Glean Hess, MD as PCP - General (Family Medicine) Erby Pian, MD as Referring Physician (Pulmonary Disease)     Objective:   Vitals: BP 118/80 mmHg  Pulse 79  Resp 16  Ht 5\' 2"  (1.575 m)  Wt 178 lb (80.74 kg)  BMI 32.55 kg/m2  SpO2 96%  Physical Exam  Constitutional: She is oriented to person, place, and time. She appears well-developed and well-nourished. No distress.  HENT:  Head: Normocephalic and atraumatic.  Right Ear: Tympanic membrane and ear canal normal.  Left Ear: Tympanic membrane and ear canal normal.  Nose: Right sinus exhibits no maxillary sinus tenderness. Left sinus exhibits no maxillary sinus tenderness.  Mouth/Throat: Uvula is midline and oropharynx is clear and moist.  Eyes: Conjunctivae and EOM are normal. Right eye exhibits no discharge. Left eye exhibits no discharge. No scleral icterus.  Neck: Normal range of motion. Carotid bruit is not present. No erythema present. No thyromegaly present.  Cardiovascular: Normal rate, regular rhythm, normal heart sounds and normal pulses.   Pulmonary/Chest: Effort normal. No respiratory distress. She has no wheezes. Right breast exhibits no mass, no nipple discharge, no skin change and no tenderness. Left breast exhibits no mass, no nipple discharge, no skin change and no tenderness.  Abdominal: Soft. Bowel  sounds are normal. There is no hepatosplenomegaly. There is no tenderness. There is no CVA tenderness.  Musculoskeletal: Normal range of motion.  Lymphadenopathy:    She has no cervical adenopathy.    She has no axillary adenopathy.  Neurological: She is alert and oriented to person, place, and time. She has normal reflexes. No cranial nerve deficit or sensory deficit.  Skin: Skin is warm, dry and intact. No rash noted.  Psychiatric: She has a normal mood and affect. Her speech is normal and behavior is normal. Thought content normal.  Nursing note and vitals reviewed.   Activities of Daily Living In your present state of health, do you have any difficulty performing the following activities: 03/12/2016  Hearing? N  Vision? N  Difficulty concentrating or making decisions? N  Walking or climbing stairs? N  Dressing or bathing? N  Doing errands, shopping? N  Preparing Food and eating ? N  Using the Toilet? N  In the past six months, have you accidently leaked urine? N  Do you have problems with loss of bowel control? N  Managing your Medications? N  Managing your Finances? N  Housekeeping or managing your Housekeeping? N  Fall Risk Assessment Fall Risk  03/12/2016 03/12/2015  Falls in the past year? No No      Depression Screen PHQ 2/9 Scores 03/12/2016 03/12/2015  PHQ - 2 Score 0 0    Cognitive Testing - 6-CIT   Correct? Score   What year is it? yes 0 Yes = 0    No = 4  What month is it? yes 0 Yes = 0    No = 3  Remember:     Pia [address], [place], [place]     What time is it? yes 0 Yes = 0    No = 3  Count backwards from 20 to 1 yes 0 Correct = 0    1 error = 2   More than 1 error = 4  Say the months of the year in reverse. yes 0 Correct = 0    1 error = 2   More than 1 error = 4  What address did I ask you to remember? yes 0 Correct = 0  1 error = 2    2 error = 4    3 error = 6    4 error = 8    All wrong = 10       TOTAL SCORE  0/28   Interpretation:  Normal   Normal (0-7) Abnormal (8-28)        Medicare Annual Wellness Visit Summary:  Reviewed patient's Family Medical History Reviewed and updated list of patient's medical providers Assessment of cognitive impairment was done Assessed patient's functional ability Established a written schedule for health screening [place] Completed and Reviewed  Exercise Activities and Dietary recommendations Goals    . Weight < 200 lb (90.719 kg)     Patient has lost 10-15 pounds being very active and would like to continue being active in her life as well as keep eating healthy.        Immunization History  Administered Date(s) Administered  . Influenza-Unspecified 07/24/2015  . Pneumococcal Conjugate-13 01/17/2014  . Pneumococcal Polysaccharide-23 02/20/2007  . Tdap 05/17/2014  . Zoster 02/04/2010    Health Maintenance  Topic Date Due  . MAMMOGRAM  02/13/2016  . INFLUENZA VACCINE  04/07/2016  . COLONOSCOPY  07/20/2022  . TETANUS/TDAP  05/17/2024  . DEXA SCAN  Addressed  . ZOSTAVAX  Completed  . PNA vac Low Risk Adult  Completed     Discussed health benefits of physical activity, and encouraged her to engage in regular exercise appropriate for her age and condition.    ------------------------------------------------------------------------------------------------------------   Assessment & Plan:  1. Medicare annual wellness visit, subsequent Measures satisfied - POCT urinalysis dipstick  2. HLD (hyperlipidemia) On statin therapy - Comprehensive metabolic panel - Lipid panel - TSH - simvastatin (ZOCOR) 10 MG tablet; Take 1 tablet (10 mg total) by mouth at bedtime.  Dispense: 90 tablet; Refill: 3  3. Gastro-esophageal reflux disease without esophagitis Controlled on PPI - CBC with Differential/Platelet - omeprazole (PRILOSEC) 20 MG capsule; Take 1 capsule (20 mg total) by mouth daily.  Dispense: 90 capsule; Refill: 3  4. Osteopenia determined by  x-ray Continue Calcium + Vit D Mammogram scheduled - DG Bone Density; Future  5. Pulmonary emphysema, unspecified emphysema type ([place]) stable - Fluticasone-Salmeterol (ADVAIR) 250-50 MCG/DOSE AEPB; Inhale 1 puff into the lungs 2 (two) times daily.  Dispense: 3 each; Refill: 3 - albuterol (PROVENTIL HFA;VENTOLIN HFA) 108 (90 Base) MCG/ACT inhaler; Inhale 2 puffs into the lungs  every 6 (six) hours as needed for wheezing or shortness of breath.  Dispense: 3 Inhaler; Refill: 3 - albuterol (PROVENTIL) (2.5 MG/3ML) 0.083% nebulizer solution; Inhale 3 mLs into the lungs 4 (four) times a week.  Dispense: 120 mL; Refill: 5  6. Allergic rhinitis, unspecified allergic rhinitis type - fluticasone (FLONASE) 50 MCG/ACT nasal spray; Place 2 sprays into both nostrils daily.  Dispense: 48 g; Refill: [place], MD [facility]  03/12/2016

## 2016-03-13 LAB — COMPREHENSIVE METABOLIC PANEL
ALT: 8 IU/L (ref 0–32)
AST: 17 IU/L (ref 0–40)
Albumin/Globulin Ratio: 1.4 (ref 1.2–2.2)
Albumin: 4.1 g/dL (ref 3.5–4.8)
Alkaline Phosphatase: 62 IU/L (ref 39–117)
BUN/Creatinine Ratio: 13 (ref 12–28)
BUN: 10 mg/dL (ref 8–27)
Bilirubin Total: 0.5 mg/dL (ref 0.0–1.2)
CALCIUM: 9.1 mg/dL (ref 8.7–10.3)
CHLORIDE: 101 mmol/L (ref 96–106)
CO2: 25 mmol/L (ref 18–29)
Creatinine, Ser: 0.76 mg/dL (ref 0.57–1.00)
GFR calc non Af Amer: 78 mL/min/{1.73_m2} (ref >59)
GFR, EST AFRICAN AMERICAN: 89 mL/min/{1.73_m2} (ref >59)
GLUCOSE: 84 mg/dL (ref 65–99)
Globulin, Total: 3 g/dL (ref 1.5–4.5)
POTASSIUM: 4.1 mmol/L (ref 3.5–5.2)
SODIUM: 141 mmol/L (ref 134–144)
TOTAL PROTEIN: 7.1 g/dL (ref 6.0–8.5)

## 2016-03-13 LAB — CBC WITH DIFFERENTIAL/PLATELET
BASOS ABS: 0 10*3/uL (ref 0.0–0.2)
Basos: 0 %
EOS (ABSOLUTE): 0.2 10*3/uL (ref 0.0–0.4)
Eos: 2 %
Hematocrit: 41.9 % (ref 34.0–46.6)
Hemoglobin: 13.8 g/dL (ref 11.1–15.9)
IMMATURE GRANS (ABS): 0 10*3/uL (ref 0.0–0.1)
IMMATURE GRANULOCYTES: 0 %
LYMPHS: 38 %
Lymphocytes Absolute: 3.4 10*3/uL — ABNORMAL HIGH (ref 0.7–3.1)
MCH: 28.9 pg (ref 26.6–33.0)
MCHC: 32.9 g/dL (ref 31.5–35.7)
MCV: 88 fL (ref 79–97)
Monocytes Absolute: 0.7 10*3/uL (ref 0.1–0.9)
Monocytes: 8 %
NEUTROS PCT: 52 %
Neutrophils Absolute: 4.7 10*3/uL (ref 1.4–7.0)
PLATELETS: 278 10*3/uL (ref 150–379)
RBC: 4.78 x10E6/uL (ref 3.77–5.28)
RDW: 13.8 % (ref 12.3–15.4)
WBC: 9.1 10*3/uL (ref 3.4–10.8)

## 2016-03-13 LAB — LIPID PANEL
Chol/HDL Ratio: 4.4 ratio units (ref 0.0–4.4)
Cholesterol, Total: 262 mg/dL — ABNORMAL HIGH (ref 100–199)
HDL: 59 mg/dL (ref >39)
LDL Calculated: 175 mg/dL — ABNORMAL HIGH (ref 0–99)
TRIGLYCERIDES: 139 mg/dL (ref 0–149)
VLDL Cholesterol Cal: 28 mg/dL (ref 5–40)

## 2016-03-13 LAB — TSH: TSH: 3.12 u[IU]/mL (ref 0.450–4.500)

## 2016-03-19 ENCOUNTER — Ambulatory Visit: Payer: Commercial Managed Care - HMO

## 2016-03-23 ENCOUNTER — Ambulatory Visit
Admission: RE | Admit: 2016-03-23 | Discharge: 2016-03-23 | Disposition: A | Discharge status: Home / Self Care | Payer: Commercial Managed Care - HMO | Source: Ambulatory Visit | Attending: Internal Medicine | Admitting: Internal Medicine

## 2016-03-23 DIAGNOSIS — Z780 Asymptomatic menopausal state: Secondary | ICD-10-CM | POA: Insufficient documentation

## 2016-03-23 DIAGNOSIS — Z1231 Encounter for screening mammogram for malignant neoplasm of breast: Secondary | ICD-10-CM | POA: Insufficient documentation

## 2016-03-23 DIAGNOSIS — M8588 Other specified disorders of bone density and structure, other site: Secondary | ICD-10-CM | POA: Diagnosis not present

## 2016-03-23 DIAGNOSIS — M85852 Other specified disorders of bone density and structure, left thigh: Secondary | ICD-10-CM | POA: Insufficient documentation

## 2016-03-23 DIAGNOSIS — M8580 Other specified disorders of bone density and structure, unspecified site: Secondary | ICD-10-CM | POA: Diagnosis present

## 2016-03-24 ENCOUNTER — Encounter: Payer: Self-pay | Admitting: Internal Medicine

## 2016-03-24 DIAGNOSIS — M810 Age-related osteoporosis without current pathological fracture: Secondary | ICD-10-CM | POA: Insufficient documentation

## 2016-11-12 ENCOUNTER — Encounter: Payer: Self-pay | Admitting: *Deleted

## 2016-11-12 ENCOUNTER — Ambulatory Visit
Admission: EM | Admit: 2016-11-12 | Discharge: 2016-11-12 | Disposition: A | Discharge status: Home / Self Care | Payer: Medicare HMO | Attending: Family Medicine | Admitting: Family Medicine

## 2016-11-12 DIAGNOSIS — J029 Acute pharyngitis, unspecified: Secondary | ICD-10-CM

## 2016-11-12 DIAGNOSIS — J449 Chronic obstructive pulmonary disease, unspecified: Secondary | ICD-10-CM

## 2016-11-12 LAB — RAPID STREP SCREEN (MED CTR MEBANE ONLY): STREPTOCOCCUS, GROUP A SCREEN (DIRECT): NEGATIVE

## 2016-11-12 MED ORDER — AMOXICILLIN-POT CLAVULANATE 875-125 MG PO TABS: 1.0000 | ORAL_TABLET | Freq: Two times a day (BID) | ORAL | 0 refills | Status: DC

## 2016-11-12 NOTE — ED Provider Notes (Signed)
CSN: [id number]     Arrival date & time 11/12/16  4010 History   First MD Initiated Contact with Patient 11/12/16 1005     Chief Complaint  Patient presents with  . Sore Throat   (Consider location/radiation/quality/duration/timing/severity/associated sxs/prior Treatment) HPI  This is a 76 year old female who awoke last night with a sore throat. Difficulty swallowing and tenderness on the left side of her neck. She became alarmed when she saw white spots on her tonsil on the left. She says in the past this is usually meant that she had strep throat. He has not had a fever or chills. He tried to see her primary care doctor, Army Melia, today but was unable. She has a history of COPD with an O2 sat today is 95% which is what she thinks is her normal level.        Past Medical History:  Diagnosis Date  . COPD (chronic obstructive pulmonary disease) ([place])    Past Surgical History:  Procedure Laterality Date  . MINOR HEMORRHOIDECTOMY     Family History  Problem Relation Age of Onset  . Heart disease Mother   . Heart disease Sister   . Heart disease Brother   . Breast cancer Neg Hx    Social History  Substance Use Topics  . Smoking status: Former Smoker    Types: Cigarettes    Quit date: 09/07/2000  . Smokeless tobacco: Never Used  . Alcohol use 0.6 oz/week    1 Glasses of wine per week   OB History    No data available     Review of Systems  Constitutional: Positive for appetite change. Negative for activity change, chills, fatigue and fever.  HENT: Positive for postnasal drip, sore throat and trouble swallowing.   Respiratory: Positive for cough, shortness of breath and wheezing. Negative for stridor.   All other systems reviewed and are negative.   Allergies  Codeine  Home Medications   Prior to Admission medications   Medication Sig Start Date End Date Taking? Authorizing Provider  albuterol (PROVENTIL HFA;VENTOLIN HFA) 108 (90 Base) MCG/ACT inhaler Inhale 2 puffs  into the lungs every 6 (six) hours as needed for wheezing or shortness of breath. 03/12/16  Yes Glean Hess, MD  albuterol (PROVENTIL) (2.5 MG/3ML) 0.083% nebulizer solution Inhale 3 mLs into the lungs 4 (four) times a week. 03/12/16  Yes Glean Hess, MD  aspirin 81 MG chewable tablet Chew 1 tablet by mouth daily.   Yes Historical Provider, MD  Calcium Carbonate-Vit D-Min (CALTRATE 600+D PLUS MINERALS) 600-800 MG-UNIT CHEW Chew 1 tablet by mouth daily.   Yes Historical Provider, MD  fluticasone (FLONASE) 50 MCG/ACT nasal spray Place 2 sprays into both nostrils daily. 03/12/16  Yes Glean Hess, MD  Fluticasone-Salmeterol (ADVAIR) 250-50 MCG/DOSE AEPB Inhale 1 puff into the lungs 2 (two) times daily. 03/12/16  Yes Glean Hess, MD  simvastatin (ZOCOR) 10 MG tablet Take 1 tablet (10 mg total) by mouth at bedtime. 03/12/16  Yes Glean Hess, MD  amoxicillin-clavulanate (AUGMENTIN) 875-125 MG tablet Take 1 tablet by mouth every 12 (twelve) hours. 11/12/16   Lorin Picket, PA-C  clobetasol cream (TEMOVATE) 2.72 % Apply 1 application topically 2 (two) times daily. 07/09/15   Glean Hess, MD  docusate sodium (COLACE) 100 MG capsule Take 1 capsule by mouth daily.    Historical Provider, MD  MULTIPLE VITAMINS PO Take 1 tablet by mouth daily.    Historical Provider, MD  omeprazole ([place])  20 MG capsule Take 1 capsule (20 mg total) by mouth daily. 03/12/16   Glean Hess, MD   Meds Ordered and Administered this Visit  Medications - No data to display  BP (!) 147/69 (BP Location: Left Arm)   Pulse 75   Temp 98.8 F (37.1 C) (Oral)   Resp 16   Ht 5\' 2"  (1.575 m)   Wt 179 lb (81.2 kg)   SpO2 95%   BMI 32.74 kg/m  No data found.   Physical Exam  Constitutional: She is oriented to person, place, and time. She appears well-developed and well-nourished. No distress.  HENT:  Head: Normocephalic and atraumatic.  Right Ear: External ear normal.  Left Ear: External ear normal.   Nose: Nose normal.  Mouth/Throat: Oropharyngeal exudate present.  Eyes: EOM are normal. Pupils are equal, round, and reactive to light. Right eye exhibits no discharge. Left eye exhibits no discharge.  Neck: Normal range of motion.  Pulmonary/Chest: Effort normal and breath sounds normal. No stridor. No respiratory distress. She has no wheezes. She has no rales.  Musculoskeletal: Normal range of motion.  Lymphadenopathy:    She has no cervical adenopathy.  Neurological: She is alert and oriented to person, place, and time.  Skin: Skin is warm and dry. She is not diaphoretic.  Psychiatric: She has a normal mood and affect. Her behavior is normal. Judgment and thought content normal.  Nursing note and vitals reviewed.   Urgent Care Course     Procedures (including critical care time)  Labs Review Labs Reviewed  RAPID STREP SCREEN (NOT AT [facility])  CULTURE, GROUP A STREP [facility])    Imaging Review No results found.   Visual Acuity Review  Right Eye Distance:   Left Eye Distance:   Bilateral Distance:    Right Eye Near:   Left Eye Near:    Bilateral Near:         MDM   1. Pharyngitis, unspecified etiology   2. Sore throat   3. Chronic obstructive pulmonary disease, unspecified COPD type [facility])    Discharge Medication List as of 11/12/2016 10:28 AM    START taking these medications   Details  amoxicillin-clavulanate (AUGMENTIN) 875-125 MG tablet Take 1 tablet by mouth every 12 (twelve) hours., Starting Thu 11/12/2016, Normal      Plan: 1. Test/x-ray results and diagnosis reviewed with patient 2. rx as per orders; risks, benefits, potential side effects reviewed with patient 3. Recommend supportive treatment with Rest and fluids. Continue with her Flonase nasal spray. Will empirically start her on Augmentin due to physical exam findings and her history of how she presents with strep. Pharyngeal swabs will be available in 48 hours for confirmation. He should follow-up with  Dr. Army Melia if she continues to have problems 4. F/u prn if symptoms worsen or don't improve     Lorin Picket, PA-C 11/12/16 1037

## 2016-11-12 NOTE — ED Triage Notes (Signed)
Awoke during night last night with sore throat. States difficulty swallowing and tenderness to left neck. Denies fever or other symptoms.

## 2016-11-15 LAB — CULTURE, GROUP A STREP (THRC)

## 2016-11-25 ENCOUNTER — Telehealth: Payer: Self-pay

## 2016-11-25 NOTE — Telephone Encounter (Signed)
Pt called stating she was given Augmentin- Clav from E.D. And it causes her to itch all over. Told per Dr. Army Melia to stop medication. Pt states feel much better.

## 2016-12-02 ENCOUNTER — Encounter: Payer: Self-pay | Admitting: Internal Medicine

## 2016-12-02 ENCOUNTER — Ambulatory Visit (INDEPENDENT_AMBULATORY_CARE_PROVIDER_SITE_OTHER): Payer: Medicare HMO | Admitting: Internal Medicine

## 2016-12-02 VITALS — BP 118/78 | HR 64 | Ht 62.0 in | Wt 182.0 lb

## 2016-12-02 DIAGNOSIS — T50905A Adverse effect of unspecified drugs, medicaments and biological substances, initial encounter: Secondary | ICD-10-CM

## 2016-12-02 DIAGNOSIS — L298 Other pruritus: Secondary | ICD-10-CM | POA: Diagnosis not present

## 2016-12-02 MED ORDER — PREDNISONE 10 MG PO TABS: ORAL_TABLET | ORAL | 0 refills | Status: DC

## 2016-12-02 NOTE — Progress Notes (Signed)
Date:  12/02/2016   Name:  Monica Campbell   DOB:  Oct 28, 1940   MRN:  [id number]   Chief Complaint: Pruritis (Pt started taking augmentin for strep throat. Began itching and stopped taking it after taking for 6 days. Last dose was on 11/18/2016. Pt is still itching. Spoke to pharmacy and was recommened diphenhydramine and its not helping.) HPI Started itching without rash after starting Augmentin.  She stopped medication 14 days ago but it still itiching.  She tried Benedryl with no change.  There is still no rash and the itching is mostly in her "hot spots" - elbows, axilla, under breasts. Her sore throat is resolved.  She showed me a picture of her left tonsil at the time - it appears to be several tonsil stones.   Review of Systems  Constitutional: Negative for chills, fatigue and fever.  HENT: Positive for congestion and postnasal drip. Negative for sore throat.   Respiratory: Positive for wheezing. Negative for chest tightness and shortness of breath.   Cardiovascular: Negative for chest pain.  Skin: Negative for color change, rash and wound.    Patient Active Problem List   Diagnosis Date Noted  . Osteopenia determined by x-ray 03/24/2016  . Pulmonary emphysema ([place]) 08/05/2015  . Chronic dermatitis 07/09/2015  . Allergic rhinitis, seasonal 12/12/2014  . Breast screening 12/12/2014  . Gastro-esophageal reflux disease without esophagitis 12/12/2014  . HLD (hyperlipidemia) 12/12/2014  . Degenerative arthritis of hip 12/12/2014  . Bilateral hearing loss 07/15/2014    Prior to Admission medications   Medication Sig Start Date End Date Taking? Authorizing Provider  albuterol (PROVENTIL HFA;VENTOLIN HFA) 108 (90 Base) MCG/ACT inhaler Inhale 2 puffs into the lungs every 6 (six) hours as needed for wheezing or shortness of breath. 03/12/16  Yes Glean Hess, MD  albuterol (PROVENTIL) (2.5 MG/3ML) 0.083% nebulizer solution Inhale 3 mLs into the lungs 4 (four) times a week.  03/12/16  Yes Glean Hess, MD  aspirin 81 MG chewable tablet Chew 1 tablet by mouth daily.   Yes Historical Provider, MD  Calcium Carbonate-Vit D-Min (CALTRATE 600+D PLUS MINERALS) 600-800 MG-UNIT CHEW Chew 1 tablet by mouth daily.   Yes Historical Provider, MD  clobetasol cream (TEMOVATE) 3.90 % Apply 1 application topically 2 (two) times daily. 07/09/15  Yes Glean Hess, MD  docusate sodium (COLACE) 100 MG capsule Take 1 capsule by mouth daily.   Yes Historical Provider, MD  fluticasone (FLONASE) 50 MCG/ACT nasal spray Place 2 sprays into both nostrils daily. 03/12/16  Yes Glean Hess, MD  Fluticasone-Salmeterol (ADVAIR) 250-50 MCG/DOSE AEPB Inhale 1 puff into the lungs 2 (two) times daily. 03/12/16  Yes Glean Hess, MD  MULTIPLE VITAMINS PO Take 1 tablet by mouth daily.   Yes Historical Provider, MD  omeprazole (PRILOSEC) 20 MG capsule Take 1 capsule (20 mg total) by mouth daily. 03/12/16  Yes Glean Hess, MD  simvastatin (ZOCOR) 10 MG tablet Take 1 tablet (10 mg total) by mouth at bedtime. 03/12/16  Yes Glean Hess, MD    Allergies  Allergen Reactions  . Augmentin [Amoxicillin-Pot Clavulanate] Itching    Itching all over.  . Codeine Rash    Past Surgical History:  Procedure Laterality Date  . MINOR HEMORRHOIDECTOMY      Social History  Substance Use Topics  . Smoking status: Former Smoker    Types: Cigarettes    Quit date: 09/07/2000  . Smokeless tobacco: Never Used  . Alcohol use  0.6 oz/week    1 Glasses of wine per week     Medication list has been reviewed and updated.   Physical Exam  Constitutional: She is oriented to person, place, and time. She appears well-developed. No distress.  HENT:  Head: Normocephalic and atraumatic.  Mouth/Throat: No oropharyngeal exudate, posterior oropharyngeal edema or posterior oropharyngeal erythema.  Cardiovascular: Normal rate, regular rhythm and normal heart sounds.   Pulmonary/Chest: Effort normal. No  respiratory distress. She has wheezes (few expiratory wheezes) in the right lower field.  Musculoskeletal: Normal range of motion.  Neurological: She is alert and oriented to person, place, and time.  Skin: Skin is warm, dry and intact. No rash noted.  Psychiatric: She has a normal mood and affect. Her behavior is normal. Thought content normal.  Nursing note and vitals reviewed.   BP 118/78 (BP Location: Right Arm, Patient Position: Sitting, Cuff Size: Normal)   Pulse 64   Ht 5\' 2"  (1.575 m)   Wt 182 lb (82.6 kg)   BMI 33.29 kg/m   Assessment and Plan: 1. Itching due to drug Prednisone taper No additional antibiotics needed at this time   Meds ordered this encounter  Medications  . predniSONE (DELTASONE) 10 MG tablet    Sig: Take 6 on day 1, 5 on day 2, 4 on day 3, 3 on day 4, 2 on day 5 and 1 on day 1 then stop.    Dispense:  21 tablet    Refill:  0    Halina Maidens, MD [facility]  12/02/2016

## 2016-12-23 DIAGNOSIS — J019 Acute sinusitis, unspecified: Secondary | ICD-10-CM | POA: Diagnosis not present

## 2016-12-23 DIAGNOSIS — J209 Acute bronchitis, unspecified: Secondary | ICD-10-CM | POA: Diagnosis not present

## 2017-02-03 ENCOUNTER — Encounter: Payer: Self-pay | Admitting: Physician Assistant

## 2017-02-03 ENCOUNTER — Ambulatory Visit (INDEPENDENT_AMBULATORY_CARE_PROVIDER_SITE_OTHER): Payer: Medicare HMO | Admitting: Physician Assistant

## 2017-02-03 VITALS — BP 108/64 | HR 74 | Ht 62.0 in | Wt 180.0 lb

## 2017-02-03 DIAGNOSIS — W57XXXA Bitten or stung by nonvenomous insect and other nonvenomous arthropods, initial encounter: Secondary | ICD-10-CM

## 2017-02-03 DIAGNOSIS — L03211 Cellulitis of face: Secondary | ICD-10-CM

## 2017-02-03 MED ORDER — DOXYCYCLINE MONOHYDRATE 100 MG PO CAPS: 100.0000 mg | ORAL_CAPSULE | Freq: Two times a day (BID) | ORAL | 0 refills | Status: AC

## 2017-02-03 NOTE — Progress Notes (Signed)
Subjective:    Patient ID: Monica Campbell, female    DOB: 1941-06-22, 76 y.o.   MRN: [id number]  Monica Campbell is a 76 y.o. female presenting on 02/03/2017 for Insect Bite (Burnt forehead with curling iron 2 days ago. Woke up yesterday and forehead was swollen down to eyebrows. Pt thinks she has been bitten by a spider in yard while doing yard work. She ran into spiderweb on porch.)   HPI   Monica Campbell is a 76 y/o woman presenting today with possible bug bite on her forehead for the two days. She says she was working in the yard where she has two bushes that spiders normally build webs between. She walked through these bushes and felt something crawling on her head. Never saw a spider but then had a rash on her forehead. She woke up yesterday and her forehead had begun to swell. No fevers, chills, pain, vision changes, headaches, problems breathing. The rash is itchy.  Social History  Substance Use Topics  . Smoking status: Former Smoker    Types: Cigarettes    Quit date: 09/07/2000  . Smokeless tobacco: Never Used  . Alcohol use 0.6 oz/week    1 Glasses of wine per week    Review of Systems Per HPI unless specifically indicated above     Objective:    BP 108/64 (BP Location: Right Arm, Patient Position: Sitting, Cuff Size: Normal)   Pulse 74   Ht 5\' 2"  (1.575 m)   Wt 180 lb (81.6 kg)   SpO2 96%   BMI 32.92 kg/m   Wt Readings from Last 3 Encounters:  02/03/17 180 lb (81.6 kg)  12/02/16 182 lb (82.6 kg)  11/12/16 179 lb (81.2 kg)    Physical Exam  Constitutional: She is oriented to person, place, and time. She appears well-developed and well-nourished.  HENT:  Head: Normocephalic and atraumatic.  Eyes: Conjunctivae are normal. Pupils are equal, round, and reactive to light.  Neck: Neck supple.  Cardiovascular: Normal rate.   Pulmonary/Chest: Effort normal.  Lymphadenopathy:    She has no cervical adenopathy.  Neurological: She is alert and oriented to person,  place, and time.  Skin: Skin is warm and dry. Rash noted. There is erythema.  Erythema on central area of forehead near hair line. Some erythematous papules and patches. Some visible and palpable swelling extending down towards brow bone. Not extending into eyes. No pus or weeping.   Psychiatric: She has a normal mood and affect. Her behavior is normal.   Results for orders placed or performed during the hospital encounter of 11/12/16  Rapid strep screen  Result Value Ref Range   Streptococcus, Group A Screen (Direct) NEGATIVE NEGATIVE  Culture, group A strep  Result Value Ref Range   Specimen Description THROAT    Special Requests NONE Reflexed from [id number]    Culture      NO GROUP A STREP (S.PYOGENES) ISOLATED Performed at [facility] [address], [place], [place] [address]    Report Status 11/15/2016 FINAL       Assessment & Plan:   1. Cellulitis of face  Treat as below, will cover for ticks as well. Counseled on signs/symptoms to seek emergency care.  - doxycycline (MONODOX) 100 MG capsule; Take 1 capsule (100 mg total) by mouth 2 (two) times daily.  Dispense: 14 capsule; Refill: 0  2. Insect bite, initial encounter  - doxycycline (MONODOX) 100 MG capsule; Take 1 capsule (100 mg total)  by mouth 2 (two) times daily.  Dispense: 14 capsule; Refill: 0  Return if symptoms worsen or fail to improve.   [facility] 02/03/2017, 8:51 AM

## 2017-02-03 NOTE — Patient Instructions (Signed)
Cellulitis, Adult Cellulitis is a skin infection. The infected area is usually red and sore. This condition occurs most often in the arms and lower legs. It is very important to get treated for this condition. Follow these instructions at home:  Take over-the-counter and prescription medicines only as told by your doctor.  If you were prescribed an antibiotic medicine, take it as told by your doctor. Do not stop taking the antibiotic even if you start to feel better.  Drink enough fluid to keep your pee (urine) clear or pale yellow.  Do not touch or rub the infected area.  Raise (elevate) the infected area above the level of your heart while you are sitting or lying down.  Place warm or cold wet cloths (warm or cold compresses) on the infected area. Do this as told by your doctor.  Keep all follow-up visits as told by your doctor. This is important. These visits let your doctor make sure your infection is not getting worse. Contact a doctor if:  You have a fever.  Your symptoms do not get better after 1-2 days of treatment.  Your bone or joint under the infected area starts to hurt after the skin has healed.  Your infection comes back. This can happen in the same area or another area.  You have a swollen bump in the infected area.  You have new symptoms.  You feel ill and also have muscle aches and pains. Get help right away if:  Your symptoms get worse.  You feel very sleepy.  You throw up (vomit) or have watery poop (diarrhea) for a long time.  There are red streaks coming from the infected area.  Your red area gets larger.  Your red area turns darker. This information is not intended to replace advice given to you by your health care provider. Make sure you discuss any questions you have with your health care provider. Document Released: 02/10/2008 Document Revised: 01/30/2016 Document Reviewed: 07/03/2015 Elsevier Interactive Patient Education  2017 Elsevier  Inc.  

## 2017-02-23 ENCOUNTER — Telehealth: Payer: Self-pay

## 2017-02-23 ENCOUNTER — Other Ambulatory Visit: Payer: Self-pay | Admitting: Internal Medicine

## 2017-02-23 MED ORDER — FLUCONAZOLE 100 MG PO TABS: 100.0000 mg | ORAL_TABLET | Freq: Every day | ORAL | 0 refills | Status: DC

## 2017-02-23 NOTE — Telephone Encounter (Signed)
Pt came in for bug bite and was given doxycycline. Antibiotic has gave pt yeast infection. Wants to know if we can call in diflucan to pharmacy to resolve. Please advise.

## 2017-02-23 NOTE — Telephone Encounter (Signed)
Patient informed of medication sent to pharmacy. Told if symptoms do not get better after taking medication she will need to be seen for OV.

## 2017-02-23 NOTE — Telephone Encounter (Signed)
Single dose of diflucan sent to pharmacy.

## 2017-02-24 ENCOUNTER — Other Ambulatory Visit: Payer: Self-pay | Admitting: Internal Medicine

## 2017-02-24 DIAGNOSIS — Z1231 Encounter for screening mammogram for malignant neoplasm of breast: Secondary | ICD-10-CM

## 2017-03-15 ENCOUNTER — Ambulatory Visit (INDEPENDENT_AMBULATORY_CARE_PROVIDER_SITE_OTHER): Payer: Medicare HMO | Admitting: Internal Medicine

## 2017-03-15 ENCOUNTER — Encounter: Payer: Self-pay | Admitting: Internal Medicine

## 2017-03-15 VITALS — BP 132/74 | HR 66 | Ht 62.0 in | Wt 181.0 lb

## 2017-03-15 DIAGNOSIS — K219 Gastro-esophageal reflux disease without esophagitis: Secondary | ICD-10-CM

## 2017-03-15 DIAGNOSIS — Z0000 Encounter for general adult medical examination without abnormal findings: Secondary | ICD-10-CM | POA: Diagnosis not present

## 2017-03-15 DIAGNOSIS — J439 Emphysema, unspecified: Secondary | ICD-10-CM

## 2017-03-15 DIAGNOSIS — R7989 Other specified abnormal findings of blood chemistry: Secondary | ICD-10-CM | POA: Diagnosis not present

## 2017-03-15 DIAGNOSIS — R946 Abnormal results of thyroid function studies: Secondary | ICD-10-CM | POA: Diagnosis not present

## 2017-03-15 DIAGNOSIS — E782 Mixed hyperlipidemia: Secondary | ICD-10-CM

## 2017-03-15 DIAGNOSIS — N3000 Acute cystitis without hematuria: Secondary | ICD-10-CM

## 2017-03-15 DIAGNOSIS — M1611 Unilateral primary osteoarthritis, right hip: Secondary | ICD-10-CM

## 2017-03-15 LAB — POCT URINALYSIS DIPSTICK
BILIRUBIN UA: NEGATIVE
GLUCOSE UA: NEGATIVE
Ketones, UA: NEGATIVE
NITRITE UA: NEGATIVE
Protein, UA: NEGATIVE
RBC UA: NEGATIVE
SPEC GRAV UA: 1.01 (ref 1.010–1.025)
Urobilinogen, UA: 0.2 E.U./dL
pH, UA: 5 (ref 5.0–8.0)

## 2017-03-15 MED ORDER — CIPROFLOXACIN HCL 250 MG PO TABS: 250.0000 mg | ORAL_TABLET | Freq: Two times a day (BID) | ORAL | 0 refills | Status: DC

## 2017-03-15 MED ORDER — ALBUTEROL SULFATE (2.5 MG/3ML) 0.083% IN NEBU: 3.0000 mL | INHALATION_SOLUTION | Freq: Four times a day (QID) | RESPIRATORY_TRACT | 5 refills | Status: DC | PRN

## 2017-03-15 MED ORDER — FLUTICASONE PROPIONATE 50 MCG/ACT NA SUSP: 2.0000 | Freq: Every day | NASAL | 3 refills | Status: DC

## 2017-03-15 MED ORDER — FLUTICASONE-SALMETEROL 250-50 MCG/DOSE IN AEPB: 1.0000 | INHALATION_SPRAY | Freq: Two times a day (BID) | RESPIRATORY_TRACT | 3 refills | Status: DC

## 2017-03-15 MED ORDER — SIMVASTATIN 10 MG PO TABS: 10.0000 mg | ORAL_TABLET | Freq: Every day | ORAL | 3 refills | Status: DC

## 2017-03-15 NOTE — Patient Instructions (Addendum)
Health Maintenance  Topic Date Due  . MAMMOGRAM  03/23/2017  . INFLUENZA VACCINE  04/07/2017  . COLONOSCOPY  07/20/2022  . TETANUS/TDAP  05/17/2024  . DEXA SCAN  Addressed  . PNA vac Low Risk Adult  Completed   Try "Skin, hair and nails" vitamin/mineral supplement

## 2017-03-15 NOTE — Progress Notes (Signed)
Patient: Monica Campbell, Female    DOB: November 17, 1940, 76 y.o.   MRN: [id number] Visit Date: 03/15/2017  Today's Provider: Halina Maidens, MD   Chief Complaint  Patient presents with  . Medicare Wellness    Breast Exam.- mammo coming up on the 18th  . Hyperlipidemia  . Gastroesophageal Reflux   Subjective:    Annual wellness visit Monica Campbell is a 76 y.o. female who presents today for her Subsequent Annual Wellness Visit. She feels well. She reports exercising walking every other day and gardening. She reports she is sleeping well. Mammogram is scheduled.  ----------------------------------------------------------- Hyperlipidemia  This is a chronic problem. Pertinent negatives include no chest pain or shortness of breath. Current antihyperlipidemic treatment includes statins. The current treatment provides significant improvement of lipids.  Gastroesophageal Reflux  She complains of wheezing. She reports no abdominal pain, no chest pain or no coughing. This is a chronic problem. The problem occurs rarely. Pertinent negatives include no fatigue. She has tried a PPI and an antacid (uses omeprazole as needed - mostly takes TUMS) for the symptoms. The treatment provided significant relief.  She had mild hip pain but it does not limit her ADLS.  She takes no regular medication for this problem.     COPD - stable mild shortness of breath.  She continues on Advair daily and uses albuterol as needed.  She has not seen her pulmonologist in some time.  Review of Systems  Constitutional: Negative for chills, fatigue and fever.  HENT: Negative for congestion, hearing loss, tinnitus, trouble swallowing and voice change.   Eyes: Negative for visual disturbance.  Respiratory: Positive for wheezing. Negative for cough, chest tightness and shortness of breath.   Cardiovascular: Negative for chest pain, palpitations and leg swelling.  Gastrointestinal: Negative for abdominal pain, constipation,  diarrhea and vomiting.  Endocrine: Negative for polydipsia and polyuria.  Genitourinary: Positive for urgency. Negative for dysuria, frequency, genital sores, vaginal bleeding and vaginal discharge.  Musculoskeletal: Negative for arthralgias, gait problem and joint swelling.  Skin: Negative for color change and rash.  Allergic/Immunologic: Positive for environmental allergies.  Neurological: Negative for dizziness, tremors, light-headedness and headaches.  Hematological: Negative for adenopathy. Does not bruise/bleed easily.  Psychiatric/Behavioral: Negative for dysphoric mood and sleep disturbance. The patient is not nervous/anxious.     Social History   Social History  . Marital status: Divorced    Spouse name: N/A  . Number of children: N/A  . Years of education: N/A   Occupational History  . Not on file.   Social History Main Topics  . Smoking status: Former Smoker    Types: Cigarettes    Quit date: 09/07/2000  . Smokeless tobacco: Never Used  . Alcohol use 0.6 oz/week    1 Glasses of wine per week  . Drug use: Unknown  . Sexual activity: Not on file   Other Topics Concern  . Not on file   Social History Narrative  . No narrative on file    Patient Active Problem List   Diagnosis Date Noted  . Osteopenia determined by x-ray 03/24/2016  . Pulmonary emphysema ([place]) 08/05/2015  . Chronic dermatitis 07/09/2015  . Allergic rhinitis, seasonal 12/12/2014  . Breast screening 12/12/2014  . Gastro-esophageal reflux disease without esophagitis 12/12/2014  . Mixed hyperlipidemia 12/12/2014  . Degenerative arthritis of hip 12/12/2014  . Bilateral hearing loss 07/15/2014    Past Surgical History:  Procedure Laterality Date  . MINOR HEMORRHOIDECTOMY      Her family  history includes Heart disease in her brother, mother, and sister.     Previous Medications   ALBUTEROL (PROVENTIL HFA;VENTOLIN HFA) 108 (90 BASE) MCG/ACT INHALER    Inhale 2 puffs into the lungs every 6  (six) hours as needed for wheezing or shortness of breath.   ALBUTEROL (PROVENTIL) (2.5 MG/3ML) 0.083% NEBULIZER SOLUTION    Inhale 3 mLs into the lungs 4 (four) times a week.   ASPIRIN 81 MG CHEWABLE TABLET    Chew 1 tablet by mouth daily.   CALCIUM CARBONATE-VIT D-MIN (CALTRATE 600+D PLUS MINERALS) 600-800 MG-UNIT CHEW    Chew 1 tablet by mouth daily.   DOCUSATE SODIUM (COLACE) 100 MG CAPSULE    Take 1 capsule by mouth daily.   FLUTICASONE (FLONASE) 50 MCG/ACT NASAL SPRAY    Place 2 sprays into both nostrils daily.   FLUTICASONE-SALMETEROL (ADVAIR) 250-50 MCG/DOSE AEPB    Inhale 1 puff into the lungs 2 (two) times daily.   MULTIPLE VITAMINS PO    Take 1 tablet by mouth daily.   OMEPRAZOLE (PRILOSEC) 20 MG CAPSULE    Take 1 capsule (20 mg total) by mouth daily.   SIMVASTATIN (ZOCOR) 10 MG TABLET    Take 1 tablet (10 mg total) by mouth at bedtime.    Patient Care Team: Glean Hess, MD as PCP - General (Family Medicine) Erby Pian, MD as Referring Physician (Pulmonary Disease)      Objective:   Vitals: BP 132/74   Pulse 66   Ht 5\' 2"  (1.575 m)   Wt 181 lb (82.1 kg)   SpO2 96%   BMI 33.11 kg/m   Physical Exam  Constitutional: She is oriented to person, place, and time. She appears well-developed and well-nourished. No distress.  HENT:  Head: Normocephalic and atraumatic.  Right Ear: Tympanic membrane and ear canal normal.  Left Ear: Tympanic membrane and ear canal normal.  Nose: Right sinus exhibits no maxillary sinus tenderness. Left sinus exhibits no maxillary sinus tenderness.  Mouth/Throat: Uvula is midline and oropharynx is clear and moist.  Eyes: Conjunctivae and EOM are normal. Right eye exhibits no discharge. Left eye exhibits no discharge. No scleral icterus.  Neck: Normal range of motion. Carotid bruit is not present. No erythema present. No thyromegaly present.  Cardiovascular: Normal rate, regular rhythm, normal heart sounds and normal pulses.     Pulmonary/Chest: Effort normal. No respiratory distress. She has no wheezes. Right breast exhibits no mass, no nipple discharge, no skin change and no tenderness. Left breast exhibits no mass, no nipple discharge, no skin change and no tenderness.  Abdominal: Soft. Bowel sounds are normal. There is no hepatosplenomegaly. There is no tenderness. There is no CVA tenderness.  Musculoskeletal: Normal range of motion.  Lymphadenopathy:    She has no cervical adenopathy.    She has no axillary adenopathy.  Neurological: She is alert and oriented to person, place, and time. She has normal reflexes. No cranial nerve deficit or sensory deficit.  Skin: Skin is warm, dry and intact. No rash noted.  Psychiatric: She has a normal mood and affect. Her speech is normal and behavior is normal. Thought content normal.  Nursing note and vitals reviewed.   Activities of Daily Living In your present state of health, do you have any difficulty performing the following activities: 03/15/2017  Hearing? N  Vision? N  Difficulty concentrating or making decisions? N  Walking or climbing stairs? N  Dressing or bathing? N  Doing errands, shopping? N  Preparing Food and eating ? N  Using the Toilet? N  In the past six months, have you accidently leaked urine? N  Do you have problems with loss of bowel control? N  Managing your Medications? N  Managing your Finances? N  Housekeeping or managing your Housekeeping? N  Some recent data might be hidden    Fall Risk Assessment Fall Risk  03/15/2017 03/12/2016 03/12/2015  Falls in the past year? No No No     Depression Screen PHQ 2/9 Scores 03/15/2017 03/12/2016 03/12/2015  PHQ - 2 Score 0 0 0   6CIT Screen 03/15/2017 03/15/2017  What Year? 0 points 0 points  What month? 0 points 0 points  What time? 0 points -  Count back from 20 0 points -  Months in reverse 0 points -  Repeat phrase 2 points -  Total Score 2 -   Urine dipstick shows positive for leukocytes.  Micro  exam: 2+ bacteria, WBCs and epithelial cells.   Medicare Annual Wellness Visit Summary:  Reviewed patient's Family Medical History Reviewed and updated list of patient's medical providers Assessment of cognitive impairment was done Assessed patient's functional ability Established a written schedule for health screening [place] Completed and Reviewed  Exercise Activities and Dietary recommendations Goals    . Weight < 200 lb (90.719 kg)          Patient has lost 10-15 pounds being very active and would like to continue being active in her life as well as keep eating healthy.        Immunization History  Administered Date(s) Administered  . Influenza-Unspecified 07/24/2015  . Pneumococcal Conjugate-13 01/17/2014  . Pneumococcal Polysaccharide-23 02/06/2004, 02/20/2007, 12/16/2011  . Tdap 05/11/2014  . Zoster 02/04/2010    Health Maintenance  Topic Date Due  . MAMMOGRAM  03/23/2017  . INFLUENZA VACCINE  04/07/2017  . COLONOSCOPY  07/20/2022  . TETANUS/TDAP  05/17/2024  . DEXA SCAN  Addressed  . PNA vac Low Risk Adult  Completed    Discussed health benefits of physical activity, and encouraged her to engage in regular exercise appropriate for her age and condition.    ------------------------------------------------------------------------------------------------------------  Assessment & Plan:   1. Medicare annual wellness visit, subsequent Measures satisfied - POCT urinalysis dipstick  2. Gastro-esophageal reflux disease without esophagitis Continue PRN tums - CBC with Differential/Platelet  3. Mixed hyperlipidemia - Comprehensive metabolic panel - Lipid panel - TSH - simvastatin (ZOCOR) 10 MG tablet; Take 1 tablet (10 mg total) by mouth at bedtime.  Dispense: 90 tablet; Refill: 3  4. Primary osteoarthritis of right hip Mild, intermittent sx  5. Pulmonary emphysema, unspecified emphysema type ([place]) Stable Immunizations UTD -  Fluticasone-Salmeterol (ADVAIR) 250-50 MCG/DOSE AEPB; Inhale 1 puff into the lungs 2 (two) times daily.  Dispense: 3 each; Refill: 3 - albuterol (PROVENTIL) (2.5 MG/3ML) 0.083% nebulizer solution; Take 3 mLs by nebulization every 6 (six) hours as needed for wheezing or shortness of breath.  Dispense: 120 mL; Refill: 5  6. Acute cystitis without hematuria - ciprofloxacin (CIPRO) 250 MG tablet; Take 1 tablet (250 mg total) by mouth 2 (two) times daily.  Dispense: 6 tablet; Refill: 0   Meds ordered this encounter  Medications  . simvastatin (ZOCOR) 10 MG tablet    Sig: Take 1 tablet (10 mg total) by mouth at bedtime.    Dispense:  90 tablet    Refill:  3  . Fluticasone-Salmeterol (ADVAIR) 250-50 MCG/DOSE AEPB    Sig: Inhale 1  puff into the lungs 2 (two) times daily.    Dispense:  3 each    Refill:  3  . fluticasone (FLONASE) 50 MCG/ACT nasal spray    Sig: Place 2 sprays into both nostrils daily.    Dispense:  48 g    Refill:  3  . albuterol (PROVENTIL) (2.5 MG/3ML) 0.083% nebulizer solution    Sig: Take 3 mLs by nebulization every 6 (six) hours as needed for wheezing or shortness of breath.    Dispense:  120 mL    Refill:  5  . ciprofloxacin (CIPRO) 250 MG tablet    Sig: Take 1 tablet (250 mg total) by mouth 2 (two) times daily.    Dispense:  6 tablet    Refill:  0    Halina Maidens, MD [facility]  03/15/2017

## 2017-03-16 LAB — COMPREHENSIVE METABOLIC PANEL
ALBUMIN: 4.3 g/dL (ref 3.5–4.8)
ALT: 9 IU/L (ref 0–32)
AST: 14 IU/L (ref 0–40)
Albumin/Globulin Ratio: 1.5 (ref 1.2–2.2)
Alkaline Phosphatase: 64 IU/L (ref 39–117)
BILIRUBIN TOTAL: 0.4 mg/dL (ref 0.0–1.2)
BUN / CREAT RATIO: 15 (ref 12–28)
BUN: 12 mg/dL (ref 8–27)
CHLORIDE: 100 mmol/L (ref 96–106)
CO2: 26 mmol/L (ref 20–29)
Calcium: 8.6 mg/dL — ABNORMAL LOW (ref 8.7–10.3)
Creatinine, Ser: 0.78 mg/dL (ref 0.57–1.00)
GFR calc non Af Amer: 75 mL/min/{1.73_m2} (ref >59)
GFR, EST AFRICAN AMERICAN: 86 mL/min/{1.73_m2} (ref >59)
Globulin, Total: 2.9 g/dL (ref 1.5–4.5)
Glucose: 71 mg/dL (ref 65–99)
Potassium: 4.1 mmol/L (ref 3.5–5.2)
SODIUM: 143 mmol/L (ref 134–144)
TOTAL PROTEIN: 7.2 g/dL (ref 6.0–8.5)

## 2017-03-16 LAB — LIPID PANEL
CHOL/HDL RATIO: 4.5 ratio — AB (ref 0.0–4.4)
Cholesterol, Total: 277 mg/dL — ABNORMAL HIGH (ref 100–199)
HDL: 61 mg/dL (ref >39)
LDL Calculated: 187 mg/dL — ABNORMAL HIGH (ref 0–99)
Triglycerides: 146 mg/dL (ref 0–149)
VLDL Cholesterol Cal: 29 mg/dL (ref 5–40)

## 2017-03-16 LAB — CBC WITH DIFFERENTIAL/PLATELET
BASOS: 1 %
Basophils Absolute: 0 10*3/uL (ref 0.0–0.2)
EOS (ABSOLUTE): 0.2 10*3/uL (ref 0.0–0.4)
Eos: 2 %
HEMATOCRIT: 41.8 % (ref 34.0–46.6)
Hemoglobin: 13.6 g/dL (ref 11.1–15.9)
IMMATURE GRANS (ABS): 0 10*3/uL (ref 0.0–0.1)
Immature Granulocytes: 0 %
LYMPHS: 35 %
Lymphocytes Absolute: 2.8 10*3/uL (ref 0.7–3.1)
MCH: 28.8 pg (ref 26.6–33.0)
MCHC: 32.5 g/dL (ref 31.5–35.7)
MCV: 89 fL (ref 79–97)
MONOS ABS: 0.6 10*3/uL (ref 0.1–0.9)
Monocytes: 7 %
Neutrophils Absolute: 4.4 10*3/uL (ref 1.4–7.0)
Neutrophils: 55 %
PLATELETS: 257 10*3/uL (ref 150–379)
RBC: 4.72 x10E6/uL (ref 3.77–5.28)
RDW: 13.9 % (ref 12.3–15.4)
WBC: 8.1 10*3/uL (ref 3.4–10.8)

## 2017-03-16 LAB — TSH: TSH: 5.22 u[IU]/mL — AB (ref 0.450–4.500)

## 2017-03-16 NOTE — Progress Notes (Signed)
Labs added- awaiting results to call and inform pt.

## 2017-03-19 LAB — T4: T4, Total: 6.8 ug/dL (ref 4.5–12.0)

## 2017-03-19 LAB — SPECIMEN STATUS REPORT

## 2017-03-19 LAB — T3: T3, Total: 116 ng/dL (ref 71–180)

## 2017-03-24 ENCOUNTER — Other Ambulatory Visit: Payer: Self-pay | Admitting: Internal Medicine

## 2017-03-24 ENCOUNTER — Ambulatory Visit
Admission: RE | Admit: 2017-03-24 | Discharge: 2017-03-24 | Disposition: A | Discharge status: Home / Self Care | Payer: Medicare HMO | Source: Ambulatory Visit | Attending: Internal Medicine | Admitting: Internal Medicine

## 2017-03-24 DIAGNOSIS — Z1231 Encounter for screening mammogram for malignant neoplasm of breast: Secondary | ICD-10-CM | POA: Diagnosis not present

## 2017-05-03 ENCOUNTER — Encounter: Payer: Self-pay | Admitting: Internal Medicine

## 2017-05-03 DIAGNOSIS — R7989 Other specified abnormal findings of blood chemistry: Secondary | ICD-10-CM | POA: Insufficient documentation

## 2017-06-15 ENCOUNTER — Other Ambulatory Visit: Payer: Self-pay | Admitting: Internal Medicine

## 2017-06-15 DIAGNOSIS — J439 Emphysema, unspecified: Secondary | ICD-10-CM

## 2017-07-05 ENCOUNTER — Ambulatory Visit
Admission: EM | Admit: 2017-07-05 | Discharge: 2017-07-05 | Disposition: A | Discharge status: Home / Self Care | Payer: Medicare HMO | Attending: Family Medicine | Admitting: Family Medicine

## 2017-07-05 ENCOUNTER — Encounter: Payer: Self-pay | Admitting: Emergency Medicine

## 2017-07-05 DIAGNOSIS — J441 Chronic obstructive pulmonary disease with (acute) exacerbation: Secondary | ICD-10-CM

## 2017-07-05 DIAGNOSIS — J029 Acute pharyngitis, unspecified: Secondary | ICD-10-CM | POA: Diagnosis not present

## 2017-07-05 DIAGNOSIS — R05 Cough: Secondary | ICD-10-CM | POA: Diagnosis not present

## 2017-07-05 MED ORDER — AZITHROMYCIN 250 MG PO TABS: ORAL_TABLET | ORAL | 0 refills | Status: DC

## 2017-07-05 MED ORDER — FLUCONAZOLE 150 MG PO TABS: ORAL_TABLET | ORAL | 0 refills | Status: DC

## 2017-07-05 MED ORDER — PREDNISONE 20 MG PO TABS: ORAL_TABLET | ORAL | 0 refills | Status: DC

## 2017-07-05 NOTE — ED Provider Notes (Signed)
[facility]    CSN: [id number] Arrival date & time: 07/05/17  1231     History   Chief Complaint Chief Complaint  Patient presents with  . flu like symptoms    HPI Monica Campbell is a 76 y.o. female.   HPI  76 year old female presents with cough congestion sore throat headaches or body aches and fever since 06/21/2017. She has a history of COPD and has been taking her routine medications of Advair albuterol nebulizer and inhaler. The patient had 2 pills of doxycycline left over from a spider bite that she had suffered in May. Taken nose but has not noticed much improvement. Temperature today is 98.9 blood pressure 132/64 pulse rate is 93 respirations 16 and O2 sats 99% on room air.        Past Medical History:  Diagnosis Date  . COPD (chronic obstructive pulmonary disease) [facility])     Patient Active Problem List   Diagnosis Date Noted  . Elevated TSH 05/03/2017  . Osteopenia determined by x-ray 03/24/2016  . Pulmonary emphysema ([place]) 08/05/2015  . Chronic dermatitis 07/09/2015  . Allergic rhinitis, seasonal 12/12/2014  . Breast screening 12/12/2014  . Gastro-esophageal reflux disease without esophagitis 12/12/2014  . Mixed hyperlipidemia 12/12/2014  . Degenerative arthritis of hip 12/12/2014  . Bilateral hearing loss 07/15/2014    Past Surgical History:  Procedure Laterality Date  . MINOR HEMORRHOIDECTOMY      OB History    No data available       Home Medications    Prior to Admission medications   Medication Sig Start Date End Date Taking? Authorizing Provider  albuterol (PROVENTIL) (2.5 MG/3ML) 0.083% nebulizer solution Take 3 mLs by nebulization every 6 (six) hours as needed for wheezing or shortness of breath. 03/15/17  Yes Glean Hess, MD  aspirin 81 MG chewable tablet Chew 1 tablet by mouth daily.   Yes [provider]  Calcium Carbonate-Vit D-Min (CALTRATE 600+D PLUS MINERALS) 600-800 MG-UNIT CHEW Chew 1 tablet by mouth  daily.   Yes [provider]  fluticasone (FLONASE) 50 MCG/ACT nasal spray Place 2 sprays into both nostrils daily. 03/15/17  Yes Glean Hess, MD  Fluticasone-Salmeterol (ADVAIR) 250-50 MCG/DOSE AEPB Inhale 1 puff into the lungs 2 (two) times daily. 03/15/17  Yes Glean Hess, MD  simvastatin (ZOCOR) 10 MG tablet Take 1 tablet (10 mg total) by mouth at bedtime. 03/15/17  Yes Glean Hess, MD  VENTOLIN HFA 108 [phone] Base) MCG/ACT inhaler INHALE TWO PUFFS BY MOUTH EVERY 6 HOURS AS NEEDED FOR WHEEZING OR  SHORTNESS  OF  BREATH 06/15/17  Yes Glean Hess, MD  azithromycin (ZITHROMAX Z-PAK) 250 MG tablet Use as per package instructions 07/05/17   Lorin Picket, PA-C  docusate sodium (COLACE) 100 MG capsule Take 1 capsule by mouth daily.    [provider]  fluconazole (DIFLUCAN) 150 MG tablet Take one tab for symptoms of yeast infection. Repeat x 1 in 72 hours. 07/05/17   Lorin Picket, PA-C  omeprazole (PRILOSEC) 20 MG capsule Take 1 capsule (20 mg total) by mouth daily. 03/12/16   Glean Hess, MD  predniSONE (DELTASONE) 20 MG tablet Take 2 tablets (40 mg) daily by mouth 07/05/17   Lorin Picket, PA-C    Family History Family History  Problem Relation Age of Onset  . Heart disease Mother   . Heart disease Sister   . Heart disease Brother   . Breast cancer Neg Hx  Social History Social History  Substance Use Topics  . Smoking status: Former Smoker    Types: Cigarettes    Quit date: 09/07/2000  . Smokeless tobacco: Never Used  . Alcohol use 0.6 oz/week    1 Glasses of wine per week     Allergies   Augmentin [amoxicillin-pot clavulanate] and Codeine   Review of Systems Review of Systems  Constitutional: Positive for activity change, chills, fatigue and fever.  HENT: Positive for congestion and sore throat.   Respiratory: Positive for cough, shortness of breath and wheezing.   All other systems reviewed and are negative.    Physical  Exam Triage Vital Signs ED Triage Vitals [07/05/17 1324]  Enc Vitals Group     BP 132/64     Pulse Rate 93     Resp 16     Temp 98.9 F (37.2 C)     Temp Source Oral     SpO2 99 %     Weight 174 lb (78.9 kg)     Height 5\' 2"  (1.575 m)     Head Circumference      Peak Flow      Pain Score 0     Pain Loc      Pain Edu?      Excl. in [place]?    No data found.   Updated Vital Signs BP 132/64 (BP Location: Left Arm)   Pulse 93   Temp 98.9 F (37.2 C) (Oral)   Resp 16   Ht 5\' 2"  (1.575 m)   Wt 174 lb (78.9 kg)   SpO2 99%   BMI 31.83 kg/m   Visual Acuity Right Eye Distance:   Left Eye Distance:   Bilateral Distance:    Right Eye Near:   Left Eye Near:    Bilateral Near:     Physical Exam  Constitutional: She is oriented to person, place, and time. She appears well-developed and well-nourished. No distress.  HENT:  Head: Normocephalic.  Right Ear: External ear normal.  Left Ear: External ear normal.  Nose: Nose normal.  Mouth/Throat: Oropharynx is clear and moist. No oropharyngeal exudate.  Eyes: Pupils are equal, round, and reactive to light. Right eye exhibits no discharge. Left eye exhibits no discharge.  Neck: Normal range of motion.  Pulmonary/Chest: Effort normal and breath sounds normal.  Musculoskeletal: Normal range of motion.  Neurological: She is alert and oriented to person, place, and time.  Skin: Skin is warm and dry. She is not diaphoretic.  Psychiatric: She has a normal mood and affect. Her behavior is normal. Judgment and thought content normal.  Nursing note and vitals reviewed.    UC Treatments / Results  Labs (all labs ordered are listed, but only abnormal results are displayed) Labs Reviewed - No data to display  EKG  EKG Interpretation None       Radiology No results found.  Procedures Procedures (including critical care time)  Medications Ordered in UC Medications - No data to display   Initial Impression / Assessment and  Plan / UC Course  I have reviewed the triage vital signs and the nursing notes.  Pertinent labs & imaging results that were available during my care of the patient were reviewed by me and considered in my medical decision making (see chart for details).     Plan: 1. Test/x-ray results and diagnosis reviewed with patient 2. rx as per orders; risks, benefits, potential side effects reviewed with patient 3. Recommend supportive treatment with continued  use of her inhalers. She did stop the doxycycline instead we'll place her on azithromycin. In addition I'll give her a short course of prednisone and Diflucan for yeast infection which she has had from the doxycycline and amoxicillin. If she worsens or is not improving she should follow-up with her primary care physician or go to the emergency room. 4. F/u prn if symptoms worsen or don't improve   Final Clinical Impressions(s) / UC Diagnoses   Final diagnoses:  COPD exacerbation ([place])    New Prescriptions Discharge Medication List as of 07/05/2017  2:20 PM    START taking these medications   Details  azithromycin (ZITHROMAX Z-PAK) 250 MG tablet Use as per package instructions, Normal    fluconazole (DIFLUCAN) 150 MG tablet Take one tab for symptoms of yeast infection. Repeat x 1 in 72 hours., Normal    predniSONE (DELTASONE) 20 MG tablet Take 2 tablets (40 mg) daily by mouth, Normal         Controlled Substance Prescriptions [place] Controlled Substance Registry consulted? Not Applicable   Lorin Picket, PA-C 07/05/17 1759

## 2017-07-05 NOTE — ED Triage Notes (Signed)
Patient c/o cough, congestion, sore throat, headaches, generalized body aches and fever since 06/21/17. Patient has started Mucinex yesterday. Patient has been using her routine medicine of Advair, Albuterol neb and inhaler. Patient states she had 2 pills of Doxycycline left over from a spider bite and she has taken those.

## 2017-09-01 ENCOUNTER — Telehealth: Payer: Self-pay

## 2017-09-01 NOTE — Telephone Encounter (Signed)
Patient called stating her insurance company needs her last labs including glucose, cholesterol LDL and HDL, last Bp reading and BMI. I faxed to her insurance the last office visit note with those vital signs included and her last labs.   FAX# given by patient [phone]

## 2017-10-07 DIAGNOSIS — H5213 Myopia, bilateral: Secondary | ICD-10-CM | POA: Diagnosis not present

## 2017-10-07 DIAGNOSIS — H524 Presbyopia: Secondary | ICD-10-CM | POA: Diagnosis not present

## 2017-10-07 DIAGNOSIS — H52209 Unspecified astigmatism, unspecified eye: Secondary | ICD-10-CM | POA: Diagnosis not present

## 2017-10-07 LAB — HM DIABETES EYE EXAM

## 2017-10-08 ENCOUNTER — Telehealth: Payer: Self-pay

## 2017-10-08 ENCOUNTER — Other Ambulatory Visit: Payer: Self-pay | Admitting: Internal Medicine

## 2017-10-08 DIAGNOSIS — H269 Unspecified cataract: Secondary | ICD-10-CM

## 2017-10-08 NOTE — Telephone Encounter (Signed)
Patient stated insurance stated she can use [facility] for this. Will get her eye doctor to send [place] report.

## 2017-10-08 NOTE — Telephone Encounter (Signed)
Patient called stating she seen the eye doctor and was told she needs to have cataracts removed. Called her insurance and they stated they require a referral for her to have this surgery from PCP.  Please Advise.

## 2017-10-08 NOTE — Telephone Encounter (Signed)
Find out who her eye doctor is and I will send a referral.  It would also be helpful if her eye doctor will send me the report saying that she needs the surgery.

## 2017-10-11 ENCOUNTER — Other Ambulatory Visit: Payer: Self-pay

## 2017-11-22 DIAGNOSIS — H2513 Age-related nuclear cataract, bilateral: Secondary | ICD-10-CM | POA: Diagnosis not present

## 2017-11-23 ENCOUNTER — Other Ambulatory Visit: Payer: Self-pay | Admitting: Otolaryngology

## 2017-11-23 ENCOUNTER — Ambulatory Visit
Admission: RE | Admit: 2017-11-23 | Discharge: 2017-11-23 | Disposition: A | Discharge status: Home / Self Care | Payer: Medicare HMO | Source: Ambulatory Visit | Attending: Otolaryngology | Admitting: Otolaryngology

## 2017-11-23 DIAGNOSIS — R0982 Postnasal drip: Secondary | ICD-10-CM | POA: Diagnosis not present

## 2017-11-23 DIAGNOSIS — J301 Allergic rhinitis due to pollen: Secondary | ICD-10-CM | POA: Diagnosis not present

## 2017-11-23 DIAGNOSIS — J342 Deviated nasal septum: Secondary | ICD-10-CM | POA: Insufficient documentation

## 2017-11-23 DIAGNOSIS — K219 Gastro-esophageal reflux disease without esophagitis: Secondary | ICD-10-CM | POA: Diagnosis not present

## 2017-11-23 DIAGNOSIS — J329 Chronic sinusitis, unspecified: Secondary | ICD-10-CM

## 2017-12-17 ENCOUNTER — Encounter: Payer: Self-pay | Admitting: Internal Medicine

## 2017-12-17 ENCOUNTER — Ambulatory Visit (INDEPENDENT_AMBULATORY_CARE_PROVIDER_SITE_OTHER): Payer: Medicare HMO | Admitting: Internal Medicine

## 2017-12-17 VITALS — BP 126/72 | HR 92 | Ht 62.0 in | Wt 180.0 lb

## 2017-12-17 DIAGNOSIS — M79672 Pain in left foot: Secondary | ICD-10-CM

## 2017-12-17 NOTE — Progress Notes (Signed)
Date:  12/17/2017   Name:  Monica Campbell   DOB:  Oct 08, 1940   MRN:  [id number]   Chief Complaint: Foot Pain (2 months ago. When getting on the floor to exercise, she crawls to couch to keep weight off hip. When she put elbow on the couch, it slipped off and foot bent with toes under foot. Had surgery 2010 or aroudn there on left foot previously. Seen [facility]. )  Foot Injury   Incident onset: 41months ago. The incident occurred at home. The injury mechanism was an inversion injury and a twisting injury. The pain is present in the left foot. The quality of the pain is described as aching and burning. The pain is moderate. The pain has been constant since onset. Associated symptoms include an inability to bear weight and tingling. She has tried immobilization, elevation and NSAIDs for the symptoms.     Review of Systems  Constitutional: Negative for chills, fatigue and fever.  Respiratory: Negative for choking, shortness of breath and wheezing.   Cardiovascular: Negative for chest pain and palpitations.  Musculoskeletal: Positive for arthralgias, gait problem and joint swelling.  Neurological: Positive for tingling.    Patient Active Problem List   Diagnosis Date Noted  . Elevated TSH 05/03/2017  . Osteopenia determined by x-ray 03/24/2016  . Pulmonary emphysema ([place]) 08/05/2015  . Chronic dermatitis 07/09/2015  . Allergic rhinitis, seasonal 12/12/2014  . Breast screening 12/12/2014  . Gastro-esophageal reflux disease without esophagitis 12/12/2014  . Mixed hyperlipidemia 12/12/2014  . Degenerative arthritis of hip 12/12/2014  . Bilateral hearing loss 07/15/2014    Prior to Admission medications   Medication Sig Start Date End Date Taking? Authorizing Provider  albuterol (PROVENTIL) (2.5 MG/3ML) 0.083% nebulizer solution Take 3 mLs by nebulization every 6 (six) hours as needed for wheezing or shortness of breath. 03/15/17  Yes Glean Hess, MD    aspirin 81 MG chewable tablet Chew 1 tablet by mouth daily.   Yes [provider]  Calcium Carbonate-Vit D-Min (CALTRATE 600+D PLUS MINERALS) 600-800 MG-UNIT CHEW Chew 1 tablet by mouth daily.   Yes [provider]  docusate sodium (COLACE) 100 MG capsule Take 1 capsule by mouth daily.   Yes [provider]  fluticasone (FLONASE) 50 MCG/ACT nasal spray Place 2 sprays into both nostrils daily. 03/15/17  Yes Glean Hess, MD  Fluticasone-Salmeterol (ADVAIR) 250-50 MCG/DOSE AEPB Inhale 1 puff into the lungs 2 (two) times daily. 03/15/17  Yes Glean Hess, MD  omeprazole (PRILOSEC) 20 MG capsule Take 1 capsule (20 mg total) by mouth daily. 03/12/16  Yes Glean Hess, MD  predniSONE (DELTASONE) 20 MG tablet Take 2 tablets (40 mg) daily by mouth 07/05/17  Yes Lorin Picket, PA-C  simvastatin (ZOCOR) 10 MG tablet Take 1 tablet (10 mg total) by mouth at bedtime. 03/15/17  Yes Glean Hess, MD  VENTOLIN HFA 108 (90 Base) MCG/ACT inhaler INHALE TWO PUFFS BY MOUTH EVERY 6 HOURS AS NEEDED FOR WHEEZING OR  SHORTNESS  OF  BREATH 06/15/17  Yes Glean Hess, MD  vitamin B-12 (CYANOCOBALAMIN) 250 MCG tablet Take by mouth.   Yes [provider]  fluconazole (DIFLUCAN) 150 MG tablet Take one tab for symptoms of yeast infection. Repeat x 1 in 72 hours. Patient not taking: Reported on 12/17/2017 07/05/17   Lorin Picket, PA-C    Allergies  Allergen Reactions  . Augmentin [Amoxicillin-Pot Clavulanate] Itching    Itching all  over.  . Codeine Rash    Past Surgical History:  Procedure Laterality Date  . MINOR HEMORRHOIDECTOMY      Social History   Tobacco Use  . Smoking status: Former Smoker    Types: Cigarettes    Last attempt to quit: 09/07/2000    Years since quitting: 17.2  . Smokeless tobacco: Never Used  Substance Use Topics  . Alcohol use: Yes    Alcohol/week: 0.6 oz    Types: 1 Glasses of wine per week  . Drug use: No     Medication  list has been reviewed and updated.  PHQ 2/9 Scores 03/15/2017 03/12/2016 03/12/2015  PHQ - 2 Score 0 0 0    Physical Exam  Constitutional: She is oriented to person, place, and time. No distress.  HENT:  Head: Normocephalic and atraumatic.  Right Ear: Hearing normal.  Left Ear: Hearing normal.  Nose: Nose normal.  Eyes: Conjunctivae and lids are normal. Right eye exhibits no discharge. Left eye exhibits no discharge. No scleral icterus.  Cardiovascular: Normal rate and normal heart sounds.  Pulses:      Dorsalis pedis pulses are 1+ on the right side, and 1+ on the left side.       Posterior tibial pulses are 1+ on the right side, and 1+ on the left side.  Pulmonary/Chest: Effort normal. No respiratory distress.  Musculoskeletal:       Left ankle: She exhibits decreased range of motion. She exhibits no deformity, no laceration and normal pulse. Tenderness.       Left foot: There is decreased range of motion.  Feet:  Left Foot:  Skin Integrity: Negative for ulcer, skin breakdown, erythema, warmth or dry skin.  Neurological: She is alert and oriented to person, place, and time.  Skin: Skin is warm, dry and intact. No lesion and no rash noted.  Psychiatric: Judgment normal.    BP 126/72   Pulse 92   Ht 5\' 2"  (1.575 m)   Wt 180 lb (81.6 kg)   SpO2 97%   BMI 32.92 kg/m   Assessment and Plan: 1. Foot pain, left S/p twisting injury Wear post surgical boot for support until seen by podiatry - Ambulatory referral to Podiatry   No orders of the defined types were placed in this encounter.   Partially dictated using Editor, commissioning. Any errors are unintentional.  Halina Maidens, MD [facility]  12/17/2017

## 2018-01-05 ENCOUNTER — Encounter: Payer: Self-pay | Admitting: Podiatry

## 2018-01-13 DIAGNOSIS — H2512 Age-related nuclear cataract, left eye: Secondary | ICD-10-CM | POA: Diagnosis not present

## 2018-01-14 ENCOUNTER — Encounter: Payer: Self-pay | Admitting: Podiatry

## 2018-01-14 ENCOUNTER — Ambulatory Visit (INDEPENDENT_AMBULATORY_CARE_PROVIDER_SITE_OTHER): Payer: Medicare HMO

## 2018-01-14 ENCOUNTER — Ambulatory Visit: Payer: Medicare HMO | Admitting: Podiatry

## 2018-01-14 DIAGNOSIS — M659 Synovitis and tenosynovitis, unspecified: Secondary | ICD-10-CM

## 2018-01-14 DIAGNOSIS — M779 Enthesopathy, unspecified: Secondary | ICD-10-CM | POA: Diagnosis not present

## 2018-01-14 MED ORDER — MELOXICAM 15 MG PO TABS: 15.0000 mg | ORAL_TABLET | Freq: Every day | ORAL | 1 refills | Status: AC

## 2018-01-17 NOTE — Progress Notes (Signed)
   Subjective:  77 year old female presenting today as a new patient with a chief complaint of pain to the arch and dorsum of the left foot that began about 5 weeks ago secondary to an injury. She reports associated swelling of the foot and pain to the lateral aspect of the foot as well. She states she rolled the foot at onset of the symptoms. She has not done anything for treatment. Bearing weight increases the pain. Patient is here for further evaluation and treatment.   Past Medical History:  Diagnosis Date  . COPD (chronic obstructive pulmonary disease) ([facility])      Objective / Physical Exam:  General:  The patient is alert and oriented x3 in no acute distress. Dermatology:  Skin is warm, dry and supple bilateral lower extremities. Negative for open lesions or macerations. Vascular:  Palpable pedal pulses bilaterally. No edema or erythema noted. Capillary refill within normal limits. Neurological:  Epicritic and protective threshold grossly intact bilaterally.  Musculoskeletal Exam:  Pain on palpation to the anterior lateral medial aspects of the patient's left ankle. Mild edema noted. Range of motion within normal limits to all pedal and ankle joints bilateral. Muscle strength 5/5 in all groups bilateral.   Radiographic Exam:  Normal osseous mineralization. Joint spaces preserved. No fracture/dislocation/boney destruction.    Assessment: 1. Ankle sprain/synovitis left   Plan of Care:  1. Patient was evaluated. X-Rays reviewed.  2. injection of 0.5 mL Celestone Soluspan injected in the patient's left ankle. 3. Compression anklet dispensed.  4. Weightbearing in CAM boot for 4 weeks. Patient has one at home.  5. Prescription for Meloxicam provided to patient.  6. Return to clinic in 4 weeks.    Edrick Kins, DPM [facility]  Dr. Edrick Kins, [address]                                        [place], [place] [address]                Office  [phone]  Fax [phone]

## 2018-01-24 NOTE — Progress Notes (Signed)
This encounter was created in error - please disregard.

## 2018-01-27 NOTE — Discharge Instructions (Signed)

## 2018-02-02 ENCOUNTER — Ambulatory Visit: Payer: Medicare HMO | Admitting: Anesthesiology

## 2018-02-02 ENCOUNTER — Ambulatory Visit
Admission: RE | Admit: 2018-02-02 | Discharge: 2018-02-02 | Disposition: A | Discharge status: Home / Self Care | Payer: Medicare HMO | Source: Ambulatory Visit | Attending: Ophthalmology | Admitting: Ophthalmology

## 2018-02-02 ENCOUNTER — Encounter
Admission: RE | Disposition: A | Discharge status: Home / Self Care | Payer: Self-pay | Source: Ambulatory Visit | Attending: Ophthalmology

## 2018-02-02 DIAGNOSIS — H2512 Age-related nuclear cataract, left eye: Secondary | ICD-10-CM | POA: Diagnosis not present

## 2018-02-02 DIAGNOSIS — Z7982 Long term (current) use of aspirin: Secondary | ICD-10-CM | POA: Diagnosis not present

## 2018-02-02 DIAGNOSIS — E7800 Pure hypercholesterolemia, unspecified: Secondary | ICD-10-CM | POA: Diagnosis not present

## 2018-02-02 DIAGNOSIS — Z87891 Personal history of nicotine dependence: Secondary | ICD-10-CM | POA: Diagnosis not present

## 2018-02-02 DIAGNOSIS — J449 Chronic obstructive pulmonary disease, unspecified: Secondary | ICD-10-CM | POA: Diagnosis not present

## 2018-02-02 DIAGNOSIS — Z79899 Other long term (current) drug therapy: Secondary | ICD-10-CM | POA: Insufficient documentation

## 2018-02-02 DIAGNOSIS — H25812 Combined forms of age-related cataract, left eye: Secondary | ICD-10-CM | POA: Diagnosis not present

## 2018-02-02 HISTORY — DX: Pure hypercholesterolemia, unspecified: E78.00

## 2018-02-02 HISTORY — DX: Unspecified hearing loss, unspecified ear: H91.90

## 2018-02-02 HISTORY — DX: Unspecified osteoarthritis, unspecified site: M19.90

## 2018-02-02 HISTORY — DX: Other seasonal allergic rhinitis: J30.2

## 2018-02-02 HISTORY — PX: CATARACT EXTRACTION W/PHACO: SHX586

## 2018-02-02 HISTORY — DX: Dyspnea, unspecified: R06.00

## 2018-02-02 HISTORY — DX: Gastro-esophageal reflux disease without esophagitis: K21.9

## 2018-02-02 SURGERY — PHACOEMULSIFICATION, CATARACT, WITH IOL INSERTION
Anesthesia: Monitor Anesthesia Care | Laterality: Left | Wound class: "Clean "

## 2018-02-02 MED ORDER — FENTANYL CITRATE (PF) 100 MCG/2ML IJ SOLN
INTRAMUSCULAR | Status: DC | PRN
  Administered 2018-02-02: 50 ug via INTRAVENOUS

## 2018-02-02 MED ORDER — LIDOCAINE HCL (PF) 2 % IJ SOLN
INTRAOCULAR | Status: DC | PRN
  Administered 2018-02-02: 1 mL via INTRAMUSCULAR

## 2018-02-02 MED ORDER — BSS IO SOLN
INTRAOCULAR | Status: DC | PRN
  Administered 2018-02-02: 76 mL via OPHTHALMIC

## 2018-02-02 MED ORDER — LACTATED RINGERS IV SOLN: INTRAVENOUS | Status: DC

## 2018-02-02 MED ORDER — MOXIFLOXACIN HCL 0.5 % OP SOLN
1.0000 [drp] | OPHTHALMIC | Status: DC | PRN
  Administered 2018-02-02 (×3): 1 [drp] via OPHTHALMIC

## 2018-02-02 MED ORDER — BRIMONIDINE TARTRATE-TIMOLOL 0.2-0.5 % OP SOLN
OPHTHALMIC | Status: DC | PRN
  Administered 2018-02-02: 1 [drp] via OPHTHALMIC

## 2018-02-02 MED ORDER — NA HYALUR & NA CHOND-NA HYALUR 0.4-0.35 ML IO KIT
PACK | INTRAOCULAR | Status: DC | PRN
  Administered 2018-02-02: 1 mL via INTRAOCULAR

## 2018-02-02 MED ORDER — ARMC OPHTHALMIC DILATING DROPS
1.0000 "application " | OPHTHALMIC | Status: DC | PRN
  Administered 2018-02-02 (×3): 1 via OPHTHALMIC

## 2018-02-02 MED ORDER — MIDAZOLAM HCL 2 MG/2ML IJ SOLN
INTRAMUSCULAR | Status: DC | PRN
  Administered 2018-02-02: 1 mg via INTRAVENOUS

## 2018-02-02 MED ORDER — MOXIFLOXACIN HCL 0.5 % OP SOLN
OPHTHALMIC | Status: DC | PRN
  Administered 2018-02-02: 0.2 mL via OPHTHALMIC

## 2018-02-02 SURGICAL SUPPLY — 27 items

## 2018-02-02 NOTE — Anesthesia Postprocedure Evaluation (Signed)
Anesthesia Post Note  Patient: Monica Campbell  Procedure(s) Performed: CATARACT EXTRACTION PHACO AND INTRAOCULAR LENS PLACEMENT ([place]) LEFT  MALYUGIN (Left )  Patient location during evaluation: PACU Anesthesia Type: MAC Level of consciousness: awake and alert Pain management: pain level controlled Vital Signs Assessment: post-procedure vital signs reviewed and stable Respiratory status: spontaneous breathing Cardiovascular status: blood pressure returned to baseline Postop Assessment: no headache Anesthetic complications: no    Jaci Standard, III,  Kinberly Perris D

## 2018-02-02 NOTE — Transfer of Care (Signed)
Immediate Anesthesia Transfer of Care Note  Patient: Monica Campbell  Procedure(s) Performed: CATARACT EXTRACTION PHACO AND INTRAOCULAR LENS PLACEMENT ([place]) LEFT  MALYUGIN (Left )  Patient Location: PACU  Anesthesia Type: MAC  Level of Consciousness: awake, alert  and patient cooperative  Airway and Oxygen Therapy: Patient Spontanous Breathing and Patient connected to supplemental oxygen  Post-op Assessment: Post-op Vital signs reviewed, Patient's Cardiovascular Status Stable, Respiratory Function Stable, Patent Airway and No signs of Nausea or vomiting  Post-op Vital Signs: Reviewed and stable  Complications: No apparent anesthesia complications

## 2018-02-02 NOTE — Op Note (Signed)
OPERATIVE NOTE  Monica Campbell [id number] 02/02/2018   PREOPERATIVE DIAGNOSIS:  Nuclear sclerotic cataract left eye. H25.12   POSTOPERATIVE DIAGNOSIS:    Nuclear sclerotic cataract left eye.     PROCEDURE:  Phacoemusification with posterior chamber intraocular lens placement of the left eye   LENS:   Implant Name Type Inv. Item Serial No. Manufacturer Lot No. LRB No. Used  LENS IOL DIOP 19.5 - [id number] Intraocular Lens LENS IOL DIOP 19.5 [id number]  Left 1        ULTRASOUND TIME: 21  % of 1 minutes 16 seconds, CDE 16.3  SURGEON:  Wyonia Hough, MD   ANESTHESIA:  Topical with tetracaine drops and 2% Xylocaine jelly, augmented with 1% preservative-free intracameral lidocaine.    COMPLICATIONS:  None.   DESCRIPTION OF PROCEDURE:  The patient was identified in the holding room and transported to the operating room and placed in the supine position under the operating microscope.  The left eye was identified as the operative eye and it was prepped and draped in the usual sterile ophthalmic fashion.   A 1 millimeter clear-corneal paracentesis was made at the 1:30 position.  0.5 ml of preservative-free 1% lidocaine was injected into the anterior chamber.  The anterior chamber was filled with Viscoat viscoelastic.  A 2.4 millimeter keratome was used to make a near-clear corneal incision at the 10:30 position.  .  A curvilinear capsulorrhexis was made with a cystotome and capsulorrhexis forceps.  Balanced salt solution was used to hydrodissect and hydrodelineate the nucleus.   Phacoemulsification was then used in stop and chop fashion to remove the lens nucleus and epinucleus.  The remaining cortex was then removed using the irrigation and aspiration handpiece. Provisc was then placed into the capsular bag to distend it for lens placement.  A lens was then injected into the capsular bag.  The remaining viscoelastic was aspirated.   Wounds were hydrated with balanced salt  solution.  The anterior chamber was inflated to a physiologic pressure with balanced salt solution.  No wound leaks were noted. Vigamox 0.2 ml of a 1mg  per ml solution was injected into the anterior chamber for a dose of 0.2 mg of intracameral antibiotic at the completion of the case.   Timolol and Brimonidine drops were applied to the eye.  The patient was taken to the recovery room in stable condition without complications of anesthesia or surgery.  [name] 02/02/2018, 8:34 AM

## 2018-02-02 NOTE — Anesthesia Preprocedure Evaluation (Addendum)
Anesthesia Evaluation  Patient identified by MRN, date of birth, ID band Patient awake    Reviewed: Allergy & Precautions, H&P , NPO status , Patient's Chart, lab work & pertinent test results  Airway Mallampati: II  TM Distance: >3 FB Neck ROM: full    Dental no notable dental hx.    Pulmonary COPD, former smoker,    Pulmonary exam normal        Cardiovascular negative cardio ROS Normal cardiovascular exam     Neuro/Psych negative neurological ROS     GI/Hepatic Neg liver ROS, Medicated,  Endo/Other  negative endocrine ROS  Renal/GU negative Renal ROS  negative genitourinary   Musculoskeletal   Abdominal   Peds  Hematology negative hematology ROS (+)   Anesthesia Other Findings   Reproductive/Obstetrics                            Anesthesia Physical Anesthesia Plan  ASA: II  Anesthesia Plan: MAC   Post-op Pain Management:    Induction:   PONV Risk Score and Plan:   Airway Management Planned:   Additional Equipment:   Intra-op Plan:   Post-operative Plan:   Informed Consent: I have reviewed the patients History and Physical, chart, labs and discussed the procedure including the risks, benefits and alternatives for the proposed anesthesia with the patient or authorized representative who has indicated his/her understanding and acceptance.     Plan Discussed with:   Anesthesia Plan Comments:         Anesthesia Quick Evaluation

## 2018-02-02 NOTE — Anesthesia Procedure Notes (Signed)
Procedure Name: MAC Date/Time: 02/02/2018 8:17 AM Performed by: Lind Guest, CRNA Pre-anesthesia Checklist: Patient identified, Emergency Drugs available, Suction available, Patient being monitored and Timeout performed Patient Re-evaluated:Patient Re-evaluated prior to induction Oxygen Delivery Method: Nasal cannula

## 2018-02-02 NOTE — H&P (Signed)
The History and Physical notes are on paper, have been signed, and are to be scanned. The patient remains stable and unchanged from the H&P.   Previous H&P reviewed, patient examined, and there are no changes.  Andres Escandon 02/02/2018 7:42 AM '

## 2018-02-03 ENCOUNTER — Encounter: Payer: Self-pay | Admitting: Ophthalmology

## 2018-02-11 ENCOUNTER — Ambulatory Visit: Payer: Medicare HMO | Admitting: Podiatry

## 2018-03-14 ENCOUNTER — Other Ambulatory Visit: Payer: Self-pay | Admitting: Internal Medicine

## 2018-03-14 DIAGNOSIS — Z1231 Encounter for screening mammogram for malignant neoplasm of breast: Secondary | ICD-10-CM

## 2018-03-15 ENCOUNTER — Encounter: Payer: Medicare HMO | Admitting: Internal Medicine

## 2018-03-21 ENCOUNTER — Ambulatory Visit: Payer: Medicare HMO

## 2018-03-22 ENCOUNTER — Encounter: Payer: Self-pay | Admitting: Internal Medicine

## 2018-03-22 ENCOUNTER — Ambulatory Visit (INDEPENDENT_AMBULATORY_CARE_PROVIDER_SITE_OTHER): Payer: 59 | Admitting: Internal Medicine

## 2018-03-22 VITALS — BP 124/80 | HR 75 | Ht 62.0 in | Wt 178.6 lb

## 2018-03-22 DIAGNOSIS — R7989 Other specified abnormal findings of blood chemistry: Secondary | ICD-10-CM | POA: Diagnosis not present

## 2018-03-22 DIAGNOSIS — E2839 Other primary ovarian failure: Secondary | ICD-10-CM

## 2018-03-22 DIAGNOSIS — Z0000 Encounter for general adult medical examination without abnormal findings: Secondary | ICD-10-CM

## 2018-03-22 DIAGNOSIS — Z0001 Encounter for general adult medical examination with abnormal findings: Secondary | ICD-10-CM | POA: Diagnosis not present

## 2018-03-22 DIAGNOSIS — K219 Gastro-esophageal reflux disease without esophagitis: Secondary | ICD-10-CM

## 2018-03-22 DIAGNOSIS — J439 Emphysema, unspecified: Secondary | ICD-10-CM

## 2018-03-22 DIAGNOSIS — E782 Mixed hyperlipidemia: Secondary | ICD-10-CM

## 2018-03-22 LAB — POC URINALYSIS WITH MICROSCOPIC (NON AUTO)MANUAL RESULT
Bilirubin, UA: NEGATIVE
Crystals: 0
Epithelial cells, urine per micros: 2
Glucose, UA: NEGATIVE
Ketones, UA: NEGATIVE
Mucus, UA: 0
Nitrite, UA: NEGATIVE
PH UA: 5 (ref 5.0–8.0)
Protein, UA: NEGATIVE
RBC UA: NEGATIVE
RBC: 0 M/uL — AB (ref 4.04–5.48)
Spec Grav, UA: 1.01 (ref 1.010–1.025)
Urobilinogen, UA: 0.2 E.U./dL
WBC CASTS UA: 2

## 2018-03-22 MED ORDER — ALBUTEROL SULFATE HFA 108 (90 BASE) MCG/ACT IN AERS: 2.0000 | INHALATION_SPRAY | Freq: Four times a day (QID) | RESPIRATORY_TRACT | 3 refills | Status: DC | PRN

## 2018-03-22 MED ORDER — FLUTICASONE PROPIONATE 50 MCG/ACT NA SUSP: 2.0000 | Freq: Every day | NASAL | 3 refills | Status: DC

## 2018-03-22 MED ORDER — SIMVASTATIN 10 MG PO TABS: 10.0000 mg | ORAL_TABLET | Freq: Every day | ORAL | 3 refills | Status: DC

## 2018-03-22 MED ORDER — FLUTICASONE-SALMETEROL 250-50 MCG/DOSE IN AEPB: 1.0000 | INHALATION_SPRAY | Freq: Two times a day (BID) | RESPIRATORY_TRACT | 3 refills | Status: DC

## 2018-03-22 NOTE — Progress Notes (Signed)
Date:  03/22/2018   Name:  MEREDETH Campbell   DOB:  04-25-1941   MRN:  [id number]   Chief Complaint: Annual Exam (Breast Exam. ) Monica Campbell is a 77 y.o. female who presents today for her Complete Annual Exam. She feels fairly well. She reports exercising walking. She reports she is sleeping well. Mammogram is scheduled for next week.  She is up to date on all vaccinations. She had last colonoscopy in 2013 and was due for a 5 yr follow up. Her MAW exam is scheduled for tomorrow.  Gastroesophageal Reflux  She complains of abdominal pain and coughing. She reports no chest pain, no heartburn, no sore throat or no wheezing. This is a recurrent problem. Pertinent negatives include no fatigue. She has tried a histamine-2 antagonist for the symptoms. The treatment provided significant relief.  Hyperlipidemia  This is a chronic problem. The problem is controlled. Associated symptoms include shortness of breath. Pertinent negatives include no chest pain. Current antihyperlipidemic treatment includes statins. The current treatment provides significant improvement of lipids.  Constipation  This is a chronic problem. The problem is unchanged. The stool is described as firm. The patient is not on a high fiber diet. She does not exercise regularly. There has not been adequate water intake. Associated symptoms include abdominal pain. Pertinent negatives include no diarrhea, fever or vomiting. She has tried stool softeners for the symptoms. The treatment provided mild relief.  COPD - stable, mild sx.  Not exercising regularly.  Using inhaler only as needed for shortness of breast related to exertion.    Review of Systems  Constitutional: Negative for chills, fatigue and fever.  HENT: Negative for congestion, hearing loss, sore throat, tinnitus, trouble swallowing and voice change.   Eyes: Negative for visual disturbance.  Respiratory: Positive for cough and shortness of breath. Negative for chest  tightness and wheezing.   Cardiovascular: Negative for chest pain, palpitations and leg swelling.  Gastrointestinal: Positive for abdominal pain and constipation. Negative for diarrhea, heartburn and vomiting.  Endocrine: Negative for polydipsia and polyuria.  Genitourinary: Negative for dysuria, frequency, genital sores, vaginal bleeding and vaginal discharge.  Musculoskeletal: Negative for arthralgias, gait problem and joint swelling.  Skin: Positive for rash (fever blisters). Negative for color change.  Neurological: Negative for dizziness, tremors, light-headedness and headaches.  Hematological: Negative for adenopathy. Does not bruise/bleed easily.  Psychiatric/Behavioral: Negative for dysphoric mood and sleep disturbance. The patient is not nervous/anxious.     Patient Active Problem List   Diagnosis Date Noted  . Foot pain, left 12/17/2017  . Elevated TSH 05/03/2017  . Osteopenia determined by x-ray 03/24/2016  . Pulmonary emphysema ([place]) 08/05/2015  . Chronic dermatitis 07/09/2015  . Allergic rhinitis, seasonal 12/12/2014  . Breast screening 12/12/2014  . Gastro-esophageal reflux disease without esophagitis 12/12/2014  . Mixed hyperlipidemia 12/12/2014  . Degenerative arthritis of hip 12/12/2014  . Bilateral hearing loss 07/15/2014    Prior to Admission medications   Medication Sig Start Date End Date Taking? Authorizing Provider  albuterol (PROVENTIL HFA) 108 (90 Base) MCG/ACT inhaler Inhale 2 puffs into the lungs every 6 (six) hours as needed for wheezing or shortness of breath.   Yes [provider]  albuterol (PROVENTIL) (2.5 MG/3ML) 0.083% nebulizer solution Take 3 mLs by nebulization every 6 (six) hours as needed for wheezing or shortness of breath. 03/15/17  Yes Glean Hess, MD  Albuterol Sulfate (PROAIR HFA IN) Inhale into the lungs as needed.   Yes [provider]  aspirin 81 MG chewable tablet Chew 1 tablet by mouth daily.   Yes [provider]  Calcium Carbonate-Vit D-Min (CALTRATE 600+D PLUS MINERALS) 600-800 MG-UNIT CHEW Chew 1 tablet by mouth daily.   Yes [provider]  cetirizine (ZYRTEC) 10 MG tablet Take 10 mg by mouth daily.   Yes [provider]  clobetasol cream (TEMOVATE) 5.57 % Apply 1 application topically as needed.   Yes [provider]  docusate sodium (COLACE) 100 MG capsule Take 1 capsule by mouth daily.   Yes [provider]  fluticasone (FLONASE) 50 MCG/ACT nasal spray Place 2 sprays into both nostrils daily. 03/15/17  Yes Glean Hess, MD  Fluticasone-Salmeterol (ADVAIR) 250-50 MCG/DOSE AEPB Inhale 1 puff into the lungs 2 (two) times daily. 03/15/17  Yes Glean Hess, MD  Multiple Vitamin (MULTIVITAMIN) tablet Take 1 tablet by mouth daily.   Yes [provider]  ranitidine (ZANTAC) 150 MG tablet Take 150 mg by mouth 2 (two) times daily.   Yes [provider]  simvastatin (ZOCOR) 10 MG tablet Take 1 tablet (10 mg total) by mouth at bedtime. Patient taking differently: Take 10 mg by mouth at bedtime.  03/15/17  Yes Glean Hess, MD  VENTOLIN HFA 108 (90 Base) MCG/ACT inhaler INHALE TWO PUFFS BY MOUTH EVERY 6 HOURS AS NEEDED FOR WHEEZING OR  SHORTNESS  OF  BREATH 06/15/17  Yes Glean Hess, MD  vitamin B-12 (CYANOCOBALAMIN) 250 MCG tablet Take by mouth.   Yes [provider]    Allergies  Allergen Reactions  . Augmentin [Amoxicillin-Pot Clavulanate] Itching    Itching all over.  . Codeine Rash    Past Surgical History:  Procedure Laterality Date  . CATARACT EXTRACTION W/PHACO Left 02/02/2018   Procedure: CATARACT EXTRACTION PHACO AND INTRAOCULAR LENS PLACEMENT ([place]) LEFT  MALYUGIN;  Surgeon: Leandrew Koyanagi, MD;  Location: [place];  Service: Ophthalmology;  Laterality: Left;  . MINOR HEMORRHOIDECTOMY      Social History   Tobacco Use  . Smoking status: Former Smoker    Types: Cigarettes    Last  attempt to quit: 09/07/2000    Years since quitting: 17.5  . Smokeless tobacco: Never Used  Substance Use Topics  . Alcohol use: Yes    Alcohol/week: 0.6 oz    Types: 1 Glasses of wine per week  . Drug use: No     Medication list has been reviewed and updated.  Current Meds  Medication Sig  . albuterol (PROVENTIL HFA) 108 (90 Base) MCG/ACT inhaler Inhale 2 puffs into the lungs every 6 (six) hours as needed for wheezing or shortness of breath.  Marland Kitchen albuterol (PROVENTIL) (2.5 MG/3ML) 0.083% nebulizer solution Take 3 mLs by nebulization every 6 (six) hours as needed for wheezing or shortness of breath.  . Albuterol Sulfate (PROAIR HFA IN) Inhale into the lungs as needed.  Marland Kitchen aspirin 81 MG chewable tablet Chew 1 tablet by mouth daily.  . Calcium Carbonate-Vit D-Min (CALTRATE 600+D PLUS MINERALS) 600-800 MG-UNIT CHEW Chew 1 tablet by mouth daily.  . cetirizine (ZYRTEC) 10 MG tablet Take 10 mg by mouth daily.  . clobetasol cream (TEMOVATE) 3.22 % Apply 1 application topically as needed.  . docusate sodium (COLACE) 100 MG capsule Take 1 capsule by mouth daily.  . fluticasone (FLONASE) 50 MCG/ACT nasal spray Place 2 sprays into both nostrils daily.  . Fluticasone-Salmeterol (ADVAIR) 250-50 MCG/DOSE AEPB Inhale 1 puff into the lungs 2 (two) times daily.  Marland Kitchen  Multiple Vitamin (MULTIVITAMIN) tablet Take 1 tablet by mouth daily.  . ranitidine (ZANTAC) 150 MG tablet Take 150 mg by mouth 2 (two) times daily.  . simvastatin (ZOCOR) 10 MG tablet Take 1 tablet (10 mg total) by mouth at bedtime. (Patient taking differently: Take 10 mg by mouth at bedtime. )  . VENTOLIN HFA 108 (90 Base) MCG/ACT inhaler INHALE TWO PUFFS BY MOUTH EVERY 6 HOURS AS NEEDED FOR WHEEZING OR  SHORTNESS  OF  BREATH  . vitamin B-12 (CYANOCOBALAMIN) 250 MCG tablet Take by mouth.    PHQ 2/9 Scores 03/22/2018 03/15/2017 03/12/2016 03/12/2015  PHQ - 2 Score 0 0 0 0    Physical Exam  Constitutional: She is oriented to person, place, and time.  She appears well-developed and well-nourished. No distress.  HENT:  Head: Normocephalic and atraumatic.  Right Ear: Tympanic membrane and ear canal normal.  Left Ear: Tympanic membrane and ear canal normal.  Nose: Right sinus exhibits no maxillary sinus tenderness. Left sinus exhibits no maxillary sinus tenderness.  Mouth/Throat: Uvula is midline and oropharynx is clear and moist.  Eyes: Conjunctivae and EOM are normal. Right eye exhibits no discharge. Left eye exhibits no discharge. No scleral icterus.  Neck: Normal range of motion. Carotid bruit is not present. No erythema present. No thyromegaly present.  Cardiovascular: Normal rate, regular rhythm, normal heart sounds and normal pulses.  Pulmonary/Chest: Effort normal. No respiratory distress. She has no wheezes. Right breast exhibits no mass, no nipple discharge, no skin change and no tenderness. Left breast exhibits no mass, no nipple discharge, no skin change and no tenderness.  Abdominal: Soft. Bowel sounds are normal. There is no hepatosplenomegaly. There is tenderness in the right lower quadrant. There is no rigidity, no guarding and no CVA tenderness. No hernia.  Musculoskeletal:       Right hip: She exhibits decreased range of motion.  Lymphadenopathy:    She has no cervical adenopathy.    She has no axillary adenopathy.  Neurological: She is alert and oriented to person, place, and time. She has normal reflexes. No cranial nerve deficit or sensory deficit.  Skin: Skin is warm, dry and intact. No rash noted.  Psychiatric: She has a normal mood and affect. Her speech is normal and behavior is normal. Thought content normal.  Nursing note and vitals reviewed.   BP 124/80   Pulse 75   Ht 5\' 2"  (1.575 m)   Wt 178 lb 9.6 oz (81 kg)   SpO2 95%   BMI 32.67 kg/m   Assessment and Plan: 1. Annual physical exam Add stool softener daily with increased daily water intake Annual Mammogram scheduled - POC urinalysis w microscopic (non  auto)  2. Pulmonary emphysema, unspecified emphysema type (HCC) Continue Advair daily and albuterol PRN - Fluticasone-Salmeterol (ADVAIR) 250-50 MCG/DOSE AEPB; Inhale 1 puff into the lungs 2 (two) times daily.  Dispense: 3 each; Refill: 3 - albuterol (PROAIR HFA) 108 (90 Base) MCG/ACT inhaler; Inhale 2 puffs into the lungs every 6 (six) hours as needed.  Dispense: 54 g; Refill: 3  3. Gastro-esophageal reflux disease without esophagitis Controlled on zantac - CBC with Differential/Platelet  4. Mixed hyperlipidemia On statin therapy - simvastatin (ZOCOR) 10 MG tablet; Take 1 tablet (10 mg total) by mouth at bedtime.  Dispense: 90 tablet; Refill: 3 - Comprehensive metabolic panel - Lipid panel  5. Elevated TSH Check labs - TSH  6. Ovarian failure - DG Bone Density; Future  Unsure about colonoscopy follow up.  No  records in chart or in [facility].  Meds ordered this encounter  Medications  . fluticasone (FLONASE) 50 MCG/ACT nasal spray    Sig: Place 2 sprays into both nostrils daily.    Dispense:  48 g    Refill:  3  . simvastatin (ZOCOR) 10 MG tablet    Sig: Take 1 tablet (10 mg total) by mouth at bedtime.    Dispense:  90 tablet    Refill:  3  . Fluticasone-Salmeterol (ADVAIR) 250-50 MCG/DOSE AEPB    Sig: Inhale 1 puff into the lungs 2 (two) times daily.    Dispense:  3 each    Refill:  3  . albuterol (PROAIR HFA) 108 (90 Base) MCG/ACT inhaler    Sig: Inhale 2 puffs into the lungs every 6 (six) hours as needed.    Dispense:  54 g    Refill:  3    Partially dictated using Editor, commissioning. Any errors are unintentional.  Halina Maidens, MD [facility]  03/22/2018

## 2018-03-22 NOTE — Patient Instructions (Addendum)
Begin stool softener daily with 8 oz of water. Increase daily fluids to 64 oz.  Health Maintenance for Postmenopausal Women Menopause is a normal process in which your reproductive ability comes to an end. This process happens gradually over a span of months to years, usually between the ages of 46 and 55. Menopause is complete when you have missed 12 consecutive menstrual periods. It is important to talk with your health care provider about some of the most common conditions that affect postmenopausal women, such as heart disease, cancer, and bone loss (osteoporosis). Adopting a healthy lifestyle and getting preventive care can help to promote your health and wellness. Those actions can also lower your chances of developing some of these common conditions. What should I know about menopause? During menopause, you may experience a number of symptoms, such as:  Moderate-to-severe hot flashes.  Night sweats.  Decrease in sex drive.  Mood swings.  Headaches.  Tiredness.  Irritability.  Memory problems.  Insomnia.  Choosing to treat or not to treat menopausal changes is an individual decision that you make with your health care provider. What should I know about hormone replacement therapy and supplements? Hormone therapy products are effective for treating symptoms that are associated with menopause, such as hot flashes and night sweats. Hormone replacement carries certain risks, especially as you become older. If you are thinking about using estrogen or estrogen with progestin treatments, discuss the benefits and risks with your health care provider. What should I know about heart disease and stroke? Heart disease, heart attack, and stroke become more likely as you age. This may be due, in part, to the hormonal changes that your body experiences during menopause. These can affect how your body processes dietary fats, triglycerides, and cholesterol. Heart attack and stroke are both  medical emergencies. There are many things that you can do to help prevent heart disease and stroke:  Have your blood pressure checked at least every 1-2 years. High blood pressure causes heart disease and increases the risk of stroke.  If you are 55-35 years old, ask your health care provider if you should take aspirin to prevent a heart attack or a stroke.  Do not use any tobacco products, including cigarettes, chewing tobacco, or electronic cigarettes. If you need help quitting, ask your health care provider.  It is important to eat a healthy diet and maintain a healthy weight. ? Be sure to include plenty of vegetables, fruits, low-fat dairy products, and lean protein. ? Avoid eating foods that are high in solid fats, added sugars, or salt (sodium).  Get regular exercise. This is one of the most important things that you can do for your health. ? Try to exercise for at least 150 minutes each week. The type of exercise that you do should increase your heart rate and make you sweat. This is known as moderate-intensity exercise. ? Try to do strengthening exercises at least twice each week. Do these in addition to the moderate-intensity exercise.  Know your numbers.Ask your health care provider to check your cholesterol and your blood glucose. Continue to have your blood tested as directed by your health care provider.  What should I know about cancer screening? There are several types of cancer. Take the following steps to reduce your risk and to catch any cancer development as early as possible. Breast Cancer  Practice breast self-awareness. ? This means understanding how your breasts normally appear and feel. ? It also means doing regular breast self-exams. Let your  health care provider know about any changes, no matter how small.  If you are 39 or older, have a clinician do a breast exam (clinical breast exam or CBE) every year. Depending on your age, family history, and medical  history, it may be recommended that you also have a yearly breast X-ray (mammogram).  If you have a family history of breast cancer, talk with your health care provider about genetic screening.  If you are at high risk for breast cancer, talk with your health care provider about having an MRI and a mammogram every year.  Breast cancer (BRCA) gene test is recommended for women who have family members with BRCA-related cancers. Results of the assessment will determine the need for genetic counseling and BRCA1 and for BRCA2 testing. BRCA-related cancers include these types: ? Breast. This occurs in males or females. ? Ovarian. ? Tubal. This may also be called fallopian tube cancer. ? Cancer of the abdominal or pelvic lining (peritoneal cancer). ? Prostate. ? Pancreatic.  Cervical, Uterine, and Ovarian Cancer Your health care provider may recommend that you be screened regularly for cancer of the pelvic organs. These include your ovaries, uterus, and vagina. This screening involves a pelvic exam, which includes checking for microscopic changes to the surface of your cervix (Pap test).  For women ages 21-65, health care providers may recommend a pelvic exam and a Pap test every three years. For women ages 5-65, they may recommend the Pap test and pelvic exam, combined with testing for human papilloma virus (HPV), every five years. Some types of HPV increase your risk of cervical cancer. Testing for HPV may also be done on women of any age who have unclear Pap test results.  Other health care providers may not recommend any screening for nonpregnant women who are considered low risk for pelvic cancer and have no symptoms. Ask your health care provider if a screening pelvic exam is right for you.  If you have had past treatment for cervical cancer or a condition that could lead to cancer, you need Pap tests and screening for cancer for at least 20 years after your treatment. If Pap tests have been  discontinued for you, your risk factors (such as having a new sexual partner) need to be reassessed to determine if you should start having screenings again. Some women have medical problems that increase the chance of getting cervical cancer. In these cases, your health care provider may recommend that you have screening and Pap tests more often.  If you have a family history of uterine cancer or ovarian cancer, talk with your health care provider about genetic screening.  If you have vaginal bleeding after reaching menopause, tell your health care provider.  There are currently no reliable tests available to screen for ovarian cancer.  Lung Cancer Lung cancer screening is recommended for adults 65-42 years old who are at high risk for lung cancer because of a history of smoking. A yearly low-dose CT scan of the lungs is recommended if you:  Currently smoke.  Have a history of at least 30 pack-years of smoking and you currently smoke or have quit within the past 15 years. A pack-year is smoking an average of one pack of cigarettes per day for one year.  Yearly screening should:  Continue until it has been 15 years since you quit.  Stop if you develop a health problem that would prevent you from having lung cancer treatment.  Colorectal Cancer  This type of cancer  can be detected and can often be prevented.  Routine colorectal cancer screening usually begins at age 42 and continues through age 31.  If you have risk factors for colon cancer, your health care provider may recommend that you be screened at an earlier age.  If you have a family history of colorectal cancer, talk with your health care provider about genetic screening.  Your health care provider may also recommend using home test kits to check for hidden blood in your stool.  A small camera at the end of a tube can be used to examine your colon directly (sigmoidoscopy or colonoscopy). This is done to check for the earliest  forms of colorectal cancer.  Direct examination of the colon should be repeated every 5-10 years until age 64. However, if early forms of precancerous polyps or small growths are found or if you have a family history or genetic risk for colorectal cancer, you may need to be screened more often.  Skin Cancer  Check your skin from head to toe regularly.  Monitor any moles. Be sure to tell your health care provider: ? About any new moles or changes in moles, especially if there is a change in a mole's shape or color. ? If you have a mole that is larger than the size of a pencil eraser.  If any of your family members has a history of skin cancer, especially at a young age, talk with your health care provider about genetic screening.  Always use sunscreen. Apply sunscreen liberally and repeatedly throughout the day.  Whenever you are outside, protect yourself by wearing long sleeves, pants, a wide-brimmed hat, and sunglasses.  What should I know about osteoporosis? Osteoporosis is a condition in which bone destruction happens more quickly than new bone creation. After menopause, you may be at an increased risk for osteoporosis. To help prevent osteoporosis or the bone fractures that can happen because of osteoporosis, the following is recommended:  If you are 69-21 years old, get at least 1,000 mg of calcium and at least 600 mg of vitamin D per day.  If you are older than age 58 but younger than age 5, get at least 1,200 mg of calcium and at least 600 mg of vitamin D per day.  If you are older than age 25, get at least 1,200 mg of calcium and at least 800 mg of vitamin D per day.  Smoking and excessive alcohol intake increase the risk of osteoporosis. Eat foods that are rich in calcium and vitamin D, and do weight-bearing exercises several times each week as directed by your health care provider. What should I know about how menopause affects my mental health? Depression may occur at any  age, but it is more common as you become older. Common symptoms of depression include:  Low or sad mood.  Changes in sleep patterns.  Changes in appetite or eating patterns.  Feeling an overall lack of motivation or enjoyment of activities that you previously enjoyed.  Frequent crying spells.  Talk with your health care provider if you think that you are experiencing depression. What should I know about immunizations? It is important that you get and maintain your immunizations. These include:  Tetanus, diphtheria, and pertussis (Tdap) booster vaccine.  Influenza every year before the flu season begins.  Pneumonia vaccine.  Shingles vaccine.  Your health care provider may also recommend other immunizations. This information is not intended to replace advice given to you by your health care provider.  Make sure you discuss any questions you have with your health care provider. Document Released: 10/16/2005 Document Revised: 03/13/2016 Document Reviewed: 05/28/2015 Elsevier Interactive Patient Education  2018 [employer].

## 2018-03-23 ENCOUNTER — Ambulatory Visit (INDEPENDENT_AMBULATORY_CARE_PROVIDER_SITE_OTHER): Payer: 59

## 2018-03-23 VITALS — BP 122/64 | HR 75 | Temp 97.8°F | Resp 12 | Ht 62.0 in | Wt 179.2 lb

## 2018-03-23 DIAGNOSIS — Z0000 Encounter for general adult medical examination without abnormal findings: Secondary | ICD-10-CM | POA: Diagnosis not present

## 2018-03-23 LAB — COMPREHENSIVE METABOLIC PANEL
ALBUMIN: 4.4 g/dL (ref 3.5–4.8)
ALT: 8 IU/L (ref 0–32)
AST: 17 IU/L (ref 0–40)
Albumin/Globulin Ratio: 1.6 (ref 1.2–2.2)
Alkaline Phosphatase: 66 IU/L (ref 39–117)
BUN / CREAT RATIO: 16 (ref 12–28)
BUN: 13 mg/dL (ref 8–27)
Bilirubin Total: 0.5 mg/dL (ref 0.0–1.2)
CALCIUM: 9.2 mg/dL (ref 8.7–10.3)
CHLORIDE: 98 mmol/L (ref 96–106)
CO2: 26 mmol/L (ref 20–29)
Creatinine, Ser: 0.79 mg/dL (ref 0.57–1.00)
GFR, EST AFRICAN AMERICAN: 84 mL/min/{1.73_m2} (ref >59)
GFR, EST NON AFRICAN AMERICAN: 73 mL/min/{1.73_m2} (ref >59)
GLOBULIN, TOTAL: 2.7 g/dL (ref 1.5–4.5)
GLUCOSE: 76 mg/dL (ref 65–99)
Potassium: 4 mmol/L (ref 3.5–5.2)
Sodium: 141 mmol/L (ref 134–144)
Total Protein: 7.1 g/dL (ref 6.0–8.5)

## 2018-03-23 LAB — CBC WITH DIFFERENTIAL/PLATELET
BASOS ABS: 0 10*3/uL (ref 0.0–0.2)
Basos: 0 %
EOS (ABSOLUTE): 0.1 10*3/uL (ref 0.0–0.4)
EOS: 2 %
HEMATOCRIT: 41 % (ref 34.0–46.6)
HEMOGLOBIN: 14.1 g/dL (ref 11.1–15.9)
IMMATURE GRANS (ABS): 0 10*3/uL (ref 0.0–0.1)
IMMATURE GRANULOCYTES: 0 %
LYMPHS: 34 %
Lymphocytes Absolute: 3.3 10*3/uL — ABNORMAL HIGH (ref 0.7–3.1)
MCH: 29.4 pg (ref 26.6–33.0)
MCHC: 34.4 g/dL (ref 31.5–35.7)
MCV: 85 fL (ref 79–97)
MONOCYTES: 8 %
Monocytes Absolute: 0.8 10*3/uL (ref 0.1–0.9)
NEUTROS PCT: 56 %
Neutrophils Absolute: 5.3 10*3/uL (ref 1.4–7.0)
Platelets: 289 10*3/uL (ref 150–450)
RBC: 4.8 x10E6/uL (ref 3.77–5.28)
RDW: 14.2 % (ref 12.3–15.4)
WBC: 9.6 10*3/uL (ref 3.4–10.8)

## 2018-03-23 LAB — LIPID PANEL
CHOL/HDL RATIO: 4.7 ratio — AB (ref 0.0–4.4)
Cholesterol, Total: 280 mg/dL — ABNORMAL HIGH (ref 100–199)
HDL: 60 mg/dL (ref >39)
LDL CALC: 187 mg/dL — AB (ref 0–99)
Triglycerides: 165 mg/dL — ABNORMAL HIGH (ref 0–149)
VLDL CHOLESTEROL CAL: 33 mg/dL (ref 5–40)

## 2018-03-23 LAB — TSH: TSH: 3.95 u[IU]/mL (ref 0.450–4.500)

## 2018-03-23 NOTE — Patient Instructions (Addendum)
Monica Campbell , Thank you for taking time to come for your Medicare Wellness Visit. I appreciate your ongoing commitment to your health goals. Please review the following plan we discussed and let me know if I can assist you in the future.   Screening recommendations/referrals: Colorectal Screening: No longer required Mammogram: Please keep your appointment as scheduled Bone Density: You will receive a call from our office regarding your  appointment  Vision and Dental Exams: Recommended annual ophthalmology exams for early detection of glaucoma and other disorders of the eye Recommended annual dental exams for proper oral hygiene  Vaccinations: Influenza vaccine: Up to date Pneumococcal vaccine: Up to date Tdap vaccine: Up to date Shingles vaccine: Please call your insurance company to determine your out of pocket expense for the Shingrix vaccine. You may also receive this vaccine at your local pharmacy or Health Dept.   Advanced directives: Please bring a copy of your POA (Power of Attorney) and/or Living Will to your next appointment.  Goals: Recommend to drink at least 6-8 8oz glasses of water per day.  Next appointment: Please schedule your Annual Wellness Visit with your Nurse Health Advisor in one year.  Preventive Care 22 Years and Older, Female Preventive care refers to lifestyle choices and visits with your health care provider that can promote health and wellness. What does preventive care include?  A yearly physical exam. This is also called an annual well check.  Dental exams once or twice a year.  Routine eye exams. Ask your health care provider how often you should have your eyes checked.  Personal lifestyle choices, including:  Daily care of your teeth and gums.  Regular physical activity.  Eating a healthy diet.  Avoiding tobacco and drug use.  Limiting alcohol use.  Practicing safe sex.  Taking low-dose aspirin every day.  Taking vitamin and mineral  supplements as recommended by your health care provider. What happens during an annual well check? The services and screenings done by your health care provider during your annual well check will depend on your age, overall health, lifestyle risk factors, and family history of disease. Counseling  Your health care provider may ask you questions about your:  Alcohol use.  Tobacco use.  Drug use.  Emotional well-being.  Home and relationship well-being.  Sexual activity.  Eating habits.  History of falls.  Memory and ability to understand (cognition).  Work and work Statistician.  Reproductive health. Screening  You may have the following tests or measurements:  Height, weight, and BMI.  Blood pressure.  Lipid and cholesterol levels. These may be checked every 5 years, or more frequently if you are over 70 years old.  Skin check.  Lung cancer screening. You may have this screening every year starting at age 42 if you have a 30-pack-year history of smoking and currently smoke or have quit within the past 15 years.  Fecal occult blood test (FOBT) of the stool. You may have this test every year starting at age 64.  Flexible sigmoidoscopy or colonoscopy. You may have a sigmoidoscopy every 5 years or a colonoscopy every 10 years starting at age 44.  Hepatitis C blood test.  Hepatitis B blood test.  Sexually transmitted disease (STD) testing.  Diabetes screening. This is done by checking your blood sugar (glucose) after you have not eaten for a while (fasting). You may have this done every 1-3 years.  Bone density scan. This is done to screen for osteoporosis. You may have this done starting  at age 8.  Mammogram. This may be done every 1-2 years. Talk to your health care provider about how often you should have regular mammograms. Talk with your health care provider about your test results, treatment options, and if necessary, the need for more tests. Vaccines  Your  health care provider may recommend certain vaccines, such as:  Influenza vaccine. This is recommended every year.  Tetanus, diphtheria, and acellular pertussis (Tdap, Td) vaccine. You may need a Td booster every 10 years.  Zoster vaccine. You may need this after age 55.  Pneumococcal 13-valent conjugate (PCV13) vaccine. One dose is recommended after age 56.  Pneumococcal polysaccharide (PPSV23) vaccine. One dose is recommended after age 28. Talk to your health care provider about which screenings and vaccines you need and how often you need them. This information is not intended to replace advice given to you by your health care provider. Make sure you discuss any questions you have with your health care provider. Document Released: 09/20/2015 Document Revised: 05/13/2016 Document Reviewed: 06/25/2015 Elsevier Interactive Patient Education  2017 [place] Prevention in the Home Falls can cause injuries. They can happen to people of all ages. There are many things you can do to make your home safe and to help prevent falls. What can I do on the outside of my home?  Regularly fix the edges of walkways and driveways and fix any cracks.  Remove anything that might make you trip as you walk through a door, such as a raised step or threshold.  Trim any bushes or trees on the path to your home.  Use bright outdoor lighting.  Clear any walking paths of anything that might make someone trip, such as rocks or tools.  Regularly check to see if handrails are loose or broken. Make sure that both sides of any steps have handrails.  Any raised decks and porches should have guardrails on the edges.  Have any leaves, snow, or ice cleared regularly.  Use sand or salt on walking paths during winter.  Clean up any spills in your garage right away. This includes oil or grease spills. What can I do in the bathroom?  Use night lights.  Install grab bars by the toilet and in the tub and  shower. Do not use towel bars as grab bars.  Use non-skid mats or decals in the tub or shower.  If you need to sit down in the shower, use a plastic, non-slip stool.  Keep the floor dry. Clean up any water that spills on the floor as soon as it happens.  Remove soap buildup in the tub or shower regularly.  Attach bath mats securely with double-sided non-slip rug tape.  Do not have throw rugs and other things on the floor that can make you trip. What can I do in the bedroom?  Use night lights.  Make sure that you have a light by your bed that is easy to reach.  Do not use any sheets or blankets that are too big for your bed. They should not hang down onto the floor.  Have a firm chair that has side arms. You can use this for support while you get dressed.  Do not have throw rugs and other things on the floor that can make you trip. What can I do in the kitchen?  Clean up any spills right away.  Avoid walking on wet floors.  Keep items that you use a lot in easy-to-reach places.  If  you need to reach something above you, use a strong step stool that has a grab bar.  Keep electrical cords out of the way.  Do not use floor polish or wax that makes floors slippery. If you must use wax, use non-skid floor wax.  Do not have throw rugs and other things on the floor that can make you trip. What can I do with my stairs?  Do not leave any items on the stairs.  Make sure that there are handrails on both sides of the stairs and use them. Fix handrails that are broken or loose. Make sure that handrails are as long as the stairways.  Check any carpeting to make sure that it is firmly attached to the stairs. Fix any carpet that is loose or worn.  Avoid having throw rugs at the top or bottom of the stairs. If you do have throw rugs, attach them to the floor with carpet tape.  Make sure that you have a light switch at the top of the stairs and the bottom of the stairs. If you do not  have them, ask someone to add them for you. What else can I do to help prevent falls?  Wear shoes that:  Do not have high heels.  Have rubber bottoms.  Are comfortable and fit you well.  Are closed at the toe. Do not wear sandals.  If you use a stepladder:  Make sure that it is fully opened. Do not climb a closed stepladder.  Make sure that both sides of the stepladder are locked into place.  Ask someone to hold it for you, if possible.  Clearly mark and make sure that you can see:  Any grab bars or handrails.  First and last steps.  Where the edge of each step is.  Use tools that help you move around (mobility aids) if they are needed. These include:  Canes.  Walkers.  Scooters.  Crutches.  Turn on the lights when you go into a dark area. Replace any light bulbs as soon as they burn out.  Set up your furniture so you have a clear path. Avoid moving your furniture around.  If any of your floors are uneven, fix them.  If there are any pets around you, be aware of where they are.  Review your medicines with your doctor. Some medicines can make you feel dizzy. This can increase your chance of falling. Ask your doctor what other things that you can do to help prevent falls. This information is not intended to replace advice given to you by your health care provider. Make sure you discuss any questions you have with your health care provider. Document Released: 06/20/2009 Document Revised: 01/30/2016 Document Reviewed: 09/28/2014 Elsevier Interactive Patient Education  2017 [employer].

## 2018-03-23 NOTE — Progress Notes (Signed)
Subjective:   Monica Campbell is a 77 y.o. female who presents for Medicare Annual (Subsequent) preventive examination.  Review of Systems:  N/A Cardiac Risk Factors include: advanced age (>110men, >58 women);dyslipidemia;obesity (BMI >30kg/m2);Other (see comment)(pulmonary emphysema)     Objective:     Vitals: BP 122/64 (BP Location: Right Arm, Patient Position: Sitting, Cuff Size: Large)   Pulse 75   Temp 97.8 F (36.6 C) (Oral)   Resp 12   Ht 5\' 2"  (1.575 m)   Wt 179 lb 3.2 oz (81.3 kg)   SpO2 93%   BMI 32.78 kg/m   Body mass index is 32.78 kg/m.  Advanced Directives 03/23/2018 02/02/2018 07/05/2017 03/15/2017 03/15/2017 03/12/2016 03/12/2015  Does Patient Have a Medical Advance Directive? Yes No Yes Yes Yes Yes No  Type of Paramedic of [place];Living will - - Special educational needs teacher of [place];Living will [place] in Chart? No - copy requested - - - No - copy requested No - copy requested -  Would patient like information on creating a medical advance directive? - No - Patient declined - - - - Yes - Scientist, clinical (histocompatibility and immunogenetics) given    Tobacco Social History   Tobacco Use  Smoking Status Former Smoker  . Packs/day: 0.50  . Years: 30.00  . Pack years: 15.00  . Types: Cigarettes  . Last attempt to quit: 09/07/2000  . Years since quitting: 17.5  Smokeless Tobacco Never Used  Tobacco Comment   smoking cessation materials not required     Counseling given: No Comment: smoking cessation materials not required  Clinical Intake:  Pre-visit preparation completed: Yes  Pain : No/denies pain   BMI - recorded: 32.78 Nutritional Status: BMI > 30  Obese Nutritional Risks: None Diabetes: No  How often do you need to have someone help you when you read instructions, pamphlets, or other written materials from your doctor or pharmacy?: 1 - Never  Interpreter Needed?:  No  Information entered by :: AEversole, LPN  Past Medical History:  Diagnosis Date  . COPD (chronic obstructive pulmonary disease) ([place])   . Dyspnea   . GERD (gastroesophageal reflux disease)   . Hard of hearing   . Hypercholesteremia   . Osteoarthritis   . Seasonal allergies    Past Surgical History:  Procedure Laterality Date  . CATARACT EXTRACTION W/PHACO Left 02/02/2018   Procedure: CATARACT EXTRACTION PHACO AND INTRAOCULAR LENS PLACEMENT ([place]) LEFT  MALYUGIN;  Surgeon: Leandrew Koyanagi, MD;  Location: [facility];  Service: Ophthalmology;  Laterality: Left;  . MINOR HEMORRHOIDECTOMY     Family History  Problem Relation Age of Onset  . Heart disease Mother   . Heart disease Sister   . Heart disease Brother   . Cancer Father        colon  . Breast cancer Neg Hx    Social History   Socioeconomic History  . Marital status: Widowed    Spouse name: Not on file  . Number of children: 3  . Years of education: Not on file  . Highest education level: 12th grade  Occupational History  . Occupation: Retired  Scientific laboratory technician  . Financial resource strain: Not hard at all  . Food insecurity:    Worry: Never true    Inability: Never true  . Transportation needs:    Medical: No    Non-medical: No  Tobacco Use  . Smoking status: Former  Smoker    Packs/day: 0.50    Years: 30.00    Pack years: 15.00    Types: Cigarettes    Last attempt to quit: 09/07/2000    Years since quitting: 17.5  . Smokeless tobacco: Never Used  . Tobacco comment: smoking cessation materials not required  Substance and Sexual Activity  . Alcohol use: Yes    Alcohol/week: 0.6 oz    Types: 1 Glasses of wine per week    Comment: social  . Drug use: No  . Sexual activity: Not Currently  Lifestyle  . Physical activity:    Days per week: 0 days    Minutes per session: 0 min  . Stress: Not at all  Relationships  . Social connections:    Talks on phone: Patient refused    Gets together:  Patient refused    Attends religious service: Patient refused    Active member of club or organization: Patient refused    Attends meetings of clubs or organizations: Patient refused    Relationship status: Widowed  Other Topics Concern  . Not on file  Social History Narrative  . Not on file    Outpatient Encounter Medications as of 03/23/2018  Medication Sig  . albuterol (PROAIR HFA) 108 (90 Base) MCG/ACT inhaler Inhale 2 puffs into the lungs every 6 (six) hours as needed.  Marland Kitchen albuterol (PROVENTIL HFA) 108 (90 Base) MCG/ACT inhaler Inhale 2 puffs into the lungs every 6 (six) hours as needed for wheezing or shortness of breath.  Marland Kitchen albuterol (PROVENTIL) (2.5 MG/3ML) 0.083% nebulizer solution Take 3 mLs by nebulization every 6 (six) hours as needed for wheezing or shortness of breath.  Marland Kitchen aspirin 81 MG chewable tablet Chew 1 tablet by mouth daily.  . Calcium Carbonate-Vit D-Min (CALTRATE 600+D PLUS MINERALS) 600-800 MG-UNIT CHEW Chew 1 tablet by mouth daily.  . cetirizine (ZYRTEC) 10 MG tablet Take 10 mg by mouth daily.  . clobetasol cream (TEMOVATE) 7.10 % Apply 1 application topically as needed.  . docusate sodium (COLACE) 100 MG capsule Take 1 capsule by mouth daily.  . fluticasone (FLONASE) 50 MCG/ACT nasal spray Place 2 sprays into both nostrils daily.  . Fluticasone-Salmeterol (ADVAIR) 250-50 MCG/DOSE AEPB Inhale 1 puff into the lungs 2 (two) times daily.  . meloxicam (MOBIC) 15 MG tablet Take 15 mg by mouth daily.  . Multiple Vitamin (MULTIVITAMIN) tablet Take 1 tablet by mouth daily.  . ranitidine (ZANTAC) 150 MG tablet Take 150 mg by mouth 2 (two) times daily.  . simvastatin (ZOCOR) 10 MG tablet Take 1 tablet (10 mg total) by mouth at bedtime.  . VENTOLIN HFA 108 (90 Base) MCG/ACT inhaler INHALE TWO PUFFS BY MOUTH EVERY 6 HOURS AS NEEDED FOR WHEEZING OR  SHORTNESS  OF  BREATH  . vitamin B-12 (CYANOCOBALAMIN) 250 MCG tablet Take by mouth.   No facility-administered encounter  medications on file as of 03/23/2018.     Activities of Daily Living In your present state of health, do you have any difficulty performing the following activities: 03/23/2018 02/02/2018  Hearing? N N  Comment B hearing aids -  Vision? N N  Comment wears eyeglasses; needs R cataract removal -  Difficulty concentrating or making decisions? N N  Walking or climbing stairs? Y N  Comment dyspnea, joint pain -  Dressing or bathing? N N  Doing errands, shopping? N -  Preparing Food and eating ? N -  Comment denies dentures -  Using the Toilet? N -  In  the past six months, have you accidently leaked urine? Y -  Comment urgency -  Do you have problems with loss of bowel control? N -  Managing your Medications? N -  Managing your Finances? N -  Housekeeping or managing your Housekeeping? N -  Some recent data might be hidden    Patient Care Team: Glean Hess, MD as PCP - General (Internal Medicine) Erby Pian, MD as Referring Physician (Pulmonary Disease) Leandrew Koyanagi, MD as Referring Physician (Ophthalmology)    Assessment:   This is a routine wellness examination for [place].  Exercise Activities and Dietary recommendations Current Exercise Habits: The patient does not participate in regular exercise at present, Exercise limited by: orthopedic condition(s);respiratory conditions(s)(foot pain, pulmonary emphysema)  Goals    . DIET - INCREASE WATER INTAKE     Recommend to drink at least 6-8 8oz glasses of water per day.    . Weight < 200 lb (90.719 kg)     Patient has lost 10-15 pounds being very active and would like to continue being active in her life as well as keep eating healthy.        Fall Risk Fall Risk  03/23/2018 03/22/2018 03/15/2017 03/12/2016 03/12/2015  Falls in the past year? Yes Yes No No No  Comment fell when exercising - - - -  Number falls in past yr: 1 1 - - -  Injury with Fall? Yes Yes - - -  Comment sprain - - - -  Risk Factor Category   High Fall Risk High Fall Risk - - -  Comment foot pain - - - -  Risk for fall due to : Impaired vision;Impaired balance/gait;Other (Comment) History of fall(s);Impaired balance/gait - - -  Risk for fall due to: Comment wears eyeglasses; needs R cataract removal; foot pain; pulmonary emphysema - - - -  Follow up Falls evaluation completed;Education provided;Falls prevention discussed Falls evaluation completed - - -   FALL RISK PREVENTION PERTAINING TO HOME: Is your home free of loose throw rugs in walkways, pet beds, electrical cords, etc? Yes Is there adequate lighting in your home to reduce risk of falls?  Yes Are there stairs in or around your home WITH handrails? Yes  ASSISTIVE DEVICES UTILIZED TO PREVENT FALLS: Use of a cane, walker or w/c? No Grab bars in the bathroom? Yes  Shower chair or a place to sit while bathing? No An elevated toilet seat or a handicapped toilet? No  Timed Get Up and Go Performed: Yes. Pt ambulated 10 feet within 20 sec. Gait slow, steady and without the use of an assistive device. No intervention required at this time. Fall risk prevention has been discussed.  Community Resource Referral:  Pt declined my offer to send [employer] Referral to Care Guide for a shower chair or an elevated toilet seat.  Depression Screen PHQ 2/9 Scores 03/23/2018 03/22/2018 03/15/2017 03/12/2016  PHQ - 2 Score 0 0 0 0  PHQ- 9 Score 0 - - -     Cognitive Function     6CIT Screen 03/23/2018 03/15/2017 03/15/2017  What Year? 0 points 0 points 0 points  What month? 0 points 0 points 0 points  What time? 0 points 0 points -  Count back from 20 0 points 0 points -  Months in reverse 2 points 0 points -  Repeat phrase 2 points 2 points -  Total Score 4 2 -    Immunization History  Administered Date(s) Administered  .  Influenza, High Dose Seasonal PF 06/09/2017  . Influenza-Unspecified 05/18/2012, 07/24/2015  . Pneumococcal Conjugate-13 01/17/2014  . Pneumococcal  Polysaccharide-23 02/06/2004, 02/20/2007, 12/16/2011  . Tdap 05/11/2014  . Zoster 02/04/2010    Qualifies for Shingles Vaccine? Yes. Zostavax completed 02/04/10. Due for Shingrix. Education has been provided regarding the importance of this vaccine. Pt has been advised to call insurance company to determine out of pocket expense. Advised may also receive vaccine at local pharmacy or Health Dept. Verbalized acceptance and understanding.  Screening Tests Health Maintenance  Topic Date Due  . MAMMOGRAM  03/24/2018  . INFLUENZA VACCINE  04/07/2018  . TETANUS/TDAP  05/11/2024  . PNA vac Low Risk Adult  Completed  . DEXA SCAN  Addressed    Cancer Screenings: Lung: Low Dose CT Chest recommended if Age 96-80 years, 30 pack-year currently smoking OR have quit w/in 15years. Patient does not qualify. Breast:  Up to date on Mammogram? Yes. Completed 03/24/17. Repeat every year. Scheduled for repeat mammography on 03/28/18   Up to date of Bone Density/Dexa? No. Completed 03/23/16. Repeat every 2 years. Ordered 03/22/18. Not yet scheduled. Message sent to referral coordinator for scheduling purposes. Colorectal: No longer required  Additional Screenings: Hepatitis C Screening: Does not qualify    Plan:  I have personally reviewed and addressed the Medicare Annual Wellness questionnaire and have noted the following in the patient's chart:  A. Medical and social history B. Use of alcohol, tobacco or illicit drugs  C. Current medications and supplements D. Functional ability and status E.  Nutritional status F.  Physical activity G. Advance directives H. List of other physicians I.  Hospitalizations, surgeries, and ER visits in previous 12 months J.  [place] such as hearing and vision if needed, cognitive and depression L. Referrals and appointments  In addition, I have reviewed and discussed with patient certain preventive protocols, quality metrics, and best practice  recommendations. A written personalized care plan for preventive services as well as general preventive health recommendations were provided to patient.  Signed,  Aleatha Borer, LPN Nurse Health Advisor  MD Recommendations: Zostavax completed 02/04/10. Due for Shingrix. Education has been provided regarding the importance of this vaccine. Pt has been advised to call insurance company to determine out of pocket expense. Advised may also receive vaccine at local pharmacy or Health Dept. Verbalized acceptance and understanding.  Mammogram: Completed 03/24/17. Repeat every year. Scheduled for repeat mammography on 03/28/18  Bone Density/Dexa: Completed 03/23/16. Repeat every 2 years. Ordered 03/22/18. Not yet scheduled but pt expressed interest in scheduling Dexa on same day as mammo. Message sent to referral coordinator for scheduling purposes.

## 2018-04-14 ENCOUNTER — Ambulatory Visit
Admission: RE | Admit: 2018-04-14 | Discharge: 2018-04-14 | Disposition: A | Discharge status: Home / Self Care | Payer: Medicare HMO | Source: Ambulatory Visit | Attending: Internal Medicine | Admitting: Internal Medicine

## 2018-04-14 DIAGNOSIS — Z1231 Encounter for screening mammogram for malignant neoplasm of breast: Secondary | ICD-10-CM | POA: Insufficient documentation

## 2018-04-14 DIAGNOSIS — M8588 Other specified disorders of bone density and structure, other site: Secondary | ICD-10-CM | POA: Diagnosis not present

## 2018-04-14 DIAGNOSIS — E2839 Other primary ovarian failure: Secondary | ICD-10-CM | POA: Diagnosis not present

## 2018-04-14 DIAGNOSIS — M810 Age-related osteoporosis without current pathological fracture: Secondary | ICD-10-CM | POA: Diagnosis not present

## 2018-04-14 DIAGNOSIS — Z780 Asymptomatic menopausal state: Secondary | ICD-10-CM | POA: Diagnosis not present

## 2018-04-17 ENCOUNTER — Encounter: Payer: Self-pay | Admitting: Internal Medicine

## 2018-04-21 DIAGNOSIS — H2511 Age-related nuclear cataract, right eye: Secondary | ICD-10-CM | POA: Diagnosis not present

## 2018-04-25 ENCOUNTER — Other Ambulatory Visit: Payer: Self-pay

## 2018-04-25 ENCOUNTER — Encounter: Payer: Self-pay | Admitting: *Deleted

## 2018-04-28 NOTE — Discharge Instructions (Signed)

## 2018-05-03 ENCOUNTER — Ambulatory Visit: Payer: Medicare HMO | Admitting: Anesthesiology

## 2018-05-03 ENCOUNTER — Ambulatory Visit
Admission: RE | Admit: 2018-05-03 | Discharge: 2018-05-03 | Disposition: A | Discharge status: Home / Self Care | Payer: Medicare HMO | Source: Ambulatory Visit | Attending: Ophthalmology | Admitting: Ophthalmology

## 2018-05-03 ENCOUNTER — Encounter
Admission: RE | Disposition: A | Discharge status: Home / Self Care | Payer: Self-pay | Source: Ambulatory Visit | Attending: Ophthalmology

## 2018-05-03 DIAGNOSIS — E7800 Pure hypercholesterolemia, unspecified: Secondary | ICD-10-CM | POA: Insufficient documentation

## 2018-05-03 DIAGNOSIS — H2511 Age-related nuclear cataract, right eye: Secondary | ICD-10-CM | POA: Insufficient documentation

## 2018-05-03 DIAGNOSIS — Z79899 Other long term (current) drug therapy: Secondary | ICD-10-CM | POA: Diagnosis not present

## 2018-05-03 DIAGNOSIS — Z885 Allergy status to narcotic agent status: Secondary | ICD-10-CM | POA: Diagnosis not present

## 2018-05-03 DIAGNOSIS — Z880 Allergy status to penicillin: Secondary | ICD-10-CM | POA: Diagnosis not present

## 2018-05-03 DIAGNOSIS — K219 Gastro-esophageal reflux disease without esophagitis: Secondary | ICD-10-CM | POA: Diagnosis not present

## 2018-05-03 DIAGNOSIS — Z87891 Personal history of nicotine dependence: Secondary | ICD-10-CM | POA: Diagnosis not present

## 2018-05-03 DIAGNOSIS — M1990 Unspecified osteoarthritis, unspecified site: Secondary | ICD-10-CM | POA: Diagnosis not present

## 2018-05-03 DIAGNOSIS — J449 Chronic obstructive pulmonary disease, unspecified: Secondary | ICD-10-CM | POA: Insufficient documentation

## 2018-05-03 DIAGNOSIS — H25811 Combined forms of age-related cataract, right eye: Secondary | ICD-10-CM | POA: Diagnosis not present

## 2018-05-03 HISTORY — PX: CATARACT EXTRACTION W/PHACO: SHX586

## 2018-05-03 SURGERY — PHACOEMULSIFICATION, CATARACT, WITH IOL INSERTION
Anesthesia: Monitor Anesthesia Care | Site: Eye | Laterality: Right | Wound class: "Clean "

## 2018-05-03 MED ORDER — ACETAMINOPHEN 160 MG/5ML PO SOLN: 325.0000 mg | ORAL | Status: DC | PRN

## 2018-05-03 MED ORDER — MIDAZOLAM HCL 2 MG/2ML IJ SOLN
INTRAMUSCULAR | Status: DC | PRN
  Administered 2018-05-03: 2 mg via INTRAVENOUS

## 2018-05-03 MED ORDER — NA HYALUR & NA CHOND-NA HYALUR 0.4-0.35 ML IO KIT
PACK | INTRAOCULAR | Status: DC | PRN
  Administered 2018-05-03: 1 mL via INTRAOCULAR

## 2018-05-03 MED ORDER — FENTANYL CITRATE (PF) 100 MCG/2ML IJ SOLN
INTRAMUSCULAR | Status: DC | PRN
  Administered 2018-05-03: 50 ug via INTRAVENOUS

## 2018-05-03 MED ORDER — CARBACHOL 0.01 % IO SOLN
INTRAOCULAR | Status: DC | PRN
  Administered 2018-05-03: 0.5 mL via INTRAOCULAR

## 2018-05-03 MED ORDER — EPINEPHRINE PF 1 MG/ML IJ SOLN
INTRAOCULAR | Status: DC | PRN
  Administered 2018-05-03: 52 mL via OPHTHALMIC

## 2018-05-03 MED ORDER — LIDOCAINE HCL (PF) 2 % IJ SOLN
INTRAOCULAR | Status: DC | PRN
  Administered 2018-05-03: 1 mL

## 2018-05-03 MED ORDER — ACETAMINOPHEN 325 MG PO TABS: 325.0000 mg | ORAL_TABLET | ORAL | Status: DC | PRN

## 2018-05-03 MED ORDER — BRIMONIDINE TARTRATE-TIMOLOL 0.2-0.5 % OP SOLN
OPHTHALMIC | Status: DC | PRN
  Administered 2018-05-03: 1 [drp] via OPHTHALMIC

## 2018-05-03 MED ORDER — ARMC OPHTHALMIC DILATING DROPS
1.0000 "application " | OPHTHALMIC | Status: DC | PRN
  Administered 2018-05-03 (×3): 1 via OPHTHALMIC

## 2018-05-03 MED ORDER — MOXIFLOXACIN HCL 0.5 % OP SOLN
OPHTHALMIC | Status: DC | PRN
  Administered 2018-05-03: 0.2 mL via OPHTHALMIC

## 2018-05-03 MED ORDER — MOXIFLOXACIN HCL 0.5 % OP SOLN
1.0000 [drp] | OPHTHALMIC | Status: DC | PRN
  Administered 2018-05-03 (×3): 1 [drp] via OPHTHALMIC

## 2018-05-03 SURGICAL SUPPLY — 27 items
CANNULA ANT/CHMB 27G (MISCELLANEOUS) ×1 IMPLANT
CANNULA ANT/CHMB 27GA (MISCELLANEOUS) ×4 IMPLANT
CARTRIDGE ABBOTT (MISCELLANEOUS) IMPLANT
GLOVE SURG LX 7.5 STRW (GLOVE) ×1
GLOVE SURG LX STRL 7.5 STRW (GLOVE) ×1 IMPLANT
GLOVE SURG TRIUMPH 8.0 PF LTX (GLOVE) ×2 IMPLANT
GOWN STRL REUS W/ TWL LRG LVL3 (GOWN DISPOSABLE) ×2 IMPLANT
GOWN STRL REUS W/TWL LRG LVL3 (GOWN DISPOSABLE) ×2
LENS IOL TECNIS ITEC 18.5 (Intraocular Lens) ×1 IMPLANT
MARKER SKIN DUAL TIP RULER LAB (MISCELLANEOUS) ×2 IMPLANT
NDL FILTER BLUNT 18X1 1/2 (NEEDLE) ×1 IMPLANT
NDL RETROBULBAR .5 NSTRL (NEEDLE) IMPLANT
NEEDLE FILTER BLUNT 18X 1/2SAF (NEEDLE) ×2
NEEDLE FILTER BLUNT 18X1 1/2 (NEEDLE) ×2 IMPLANT
PACK CATARACT BRASINGTON (MISCELLANEOUS) ×2 IMPLANT
PACK EYE AFTER SURG (MISCELLANEOUS) ×2 IMPLANT
PACK OPTHALMIC (MISCELLANEOUS) ×2 IMPLANT
RING MALYGIN 7.0 (MISCELLANEOUS) IMPLANT
SUT ETHILON 10-0 CS-B-6CS-B-6 (SUTURE)
SUT VICRYL  9 0 (SUTURE)
SUT VICRYL 9 0 (SUTURE) IMPLANT
SUTURE EHLN 10-0 CS-B-6CS-B-6 (SUTURE) IMPLANT
SYR 3ML LL SCALE MARK (SYRINGE) ×3 IMPLANT
SYR 5ML LL (SYRINGE) ×2 IMPLANT
SYR TB 1ML LUER SLIP (SYRINGE) ×2 IMPLANT
WATER STERILE IRR 500ML POUR (IV SOLUTION) ×2 IMPLANT
WIPE NON LINTING 3.25X3.25 (MISCELLANEOUS) ×2 IMPLANT

## 2018-05-03 NOTE — Transfer of Care (Signed)
Immediate Anesthesia Transfer of Care Note  Patient: Monica Campbell  Procedure(s) Performed: CATARACT EXTRACTION PHACO AND INTRAOCULAR LENS PLACEMENT ([facility]) RIGHT  IVA/TOPICAL (Right Eye)  Patient Location: PACU  Anesthesia Type: MAC  Level of Consciousness: awake, alert  and patient cooperative  Airway and Oxygen Therapy: Patient Spontanous Breathing and Patient connected to supplemental oxygen  Post-op Assessment: Post-op Vital signs reviewed, Patient's Cardiovascular Status Stable, Respiratory Function Stable, Patent Airway and No signs of Nausea or vomiting  Post-op Vital Signs: Reviewed and stable  Complications: No apparent anesthesia complications

## 2018-05-03 NOTE — Anesthesia Procedure Notes (Signed)
Procedure Name: MAC Date/Time: 05/03/2018 8:39 AM Performed by: Janna Arch, CRNA Pre-anesthesia Checklist: Patient identified, Emergency Drugs available, Suction available and Patient being monitored Patient Re-evaluated:Patient Re-evaluated prior to induction Oxygen Delivery Method: Nasal cannula

## 2018-05-03 NOTE — Op Note (Signed)
LOCATION:  [place]   PREOPERATIVE DIAGNOSIS:    Nuclear sclerotic cataract right eye. H25.11   POSTOPERATIVE DIAGNOSIS:  Nuclear sclerotic cataract right eye.     PROCEDURE:  Phacoemusification with posterior chamber intraocular lens placement of the right eye   LENS:   Implant Name Type Inv. Item Serial No. Manufacturer Lot No. LRB No. Used  LENS IOL DIOP 18.5 - [id number] Intraocular Lens LENS IOL DIOP 18.5 [id number] AMO  Right 1        ULTRASOUND TIME: 17 % of 1 minutes, 5 seconds.  CDE 11.0   SURGEON:  Wyonia Hough, MD   ANESTHESIA:  Topical with tetracaine drops and 2% Xylocaine jelly, augmented with 1% preservative-free intracameral lidocaine.    COMPLICATIONS:  None.   DESCRIPTION OF PROCEDURE:  The patient was identified in the holding room and transported to the operating room and placed in the supine position under the operating microscope.  The right eye was identified as the operative eye and it was prepped and draped in the usual sterile ophthalmic fashion.   A 1 millimeter clear-corneal paracentesis was made at the 12:00 position.  0.5 ml of preservative-free 1% lidocaine was injected into the anterior chamber. The anterior chamber was filled with Viscoat viscoelastic.  A 2.4 millimeter keratome was used to make a near-clear corneal incision at the 9:00 position.  A curvilinear capsulorrhexis was made with a cystotome and capsulorrhexis forceps.  Balanced salt solution was used to hydrodissect and hydrodelineate the nucleus.   Phacoemulsification was then used in stop and chop fashion to remove the lens nucleus and epinucleus.  The remaining cortex was then removed using the irrigation and aspiration handpiece. Provisc was then placed into the capsular bag to distend it for lens placement.  A lens was then injected into the capsular bag.  The remaining viscoelastic was aspirated.   Wounds were hydrated with balanced salt solution.  The anterior  chamber was inflated to a physiologic pressure with balanced salt solution.  No wound leaks were noted. Vigamox 0.2 ml of a 1mg  per ml solution was injected into the anterior chamber for a dose of 0.2 mg of intracameral antibiotic at the completion of the case.   Timolol and Brimonidine drops were applied to the eye.  The patient was taken to the recovery room in stable condition without complications of anesthesia or surgery.   Saprina Chuong 05/03/2018, 8:56 AM

## 2018-05-03 NOTE — Anesthesia Postprocedure Evaluation (Signed)
Anesthesia Post Note  Patient: Monica Campbell  Procedure(s) Performed: CATARACT EXTRACTION PHACO AND INTRAOCULAR LENS PLACEMENT (IOC) RIGHT  IVA/TOPICAL (Right Eye)  Patient location during evaluation: PACU Anesthesia Type: MAC Level of consciousness: awake and alert Pain management: pain level controlled Vital Signs Assessment: post-procedure vital signs reviewed and stable Respiratory status: spontaneous breathing, nonlabored ventilation, respiratory function stable and patient connected to nasal cannula oxygen Cardiovascular status: stable and blood pressure returned to baseline Postop Assessment: no apparent nausea or vomiting Anesthetic complications: no    Alisa Graff

## 2018-05-03 NOTE — H&P (Signed)
The History and Physical notes are on paper, have been signed, and are to be scanned. The patient remains stable and unchanged from the H&P.   Previous H&P reviewed, patient examined, and there are no changes.  Lowry Bala 05/03/2018 8:08 AM

## 2018-05-03 NOTE — Anesthesia Preprocedure Evaluation (Signed)
Anesthesia Evaluation  Patient identified by MRN, date of birth, ID band Patient awake    Reviewed: Allergy & Precautions, H&P , NPO status , Patient's Chart, lab work & pertinent test results, reviewed documented beta blocker date and time   Airway Mallampati: II  TM Distance: >3 FB Neck ROM: full    Dental no notable dental hx.    Pulmonary COPD, former smoker,    Pulmonary exam normal breath sounds clear to auscultation       Cardiovascular Exercise Tolerance: Good negative cardio ROS   Rhythm:regular Rate:Normal     Neuro/Psych negative neurological ROS  negative psych ROS   GI/Hepatic Neg liver ROS, GERD  ,  Endo/Other  negative endocrine ROS  Renal/GU negative Renal ROS  negative genitourinary   Musculoskeletal   Abdominal   Peds  Hematology negative hematology ROS (+)   Anesthesia Other Findings   Reproductive/Obstetrics negative OB ROS                             Anesthesia Physical Anesthesia Plan  ASA: II  Anesthesia Plan: MAC   Post-op Pain Management:    Induction:   PONV Risk Score and Plan:   Airway Management Planned:   Additional Equipment:   Intra-op Plan:   Post-operative Plan:   Informed Consent: I have reviewed the patients History and Physical, chart, labs and discussed the procedure including the risks, benefits and alternatives for the proposed anesthesia with the patient or authorized representative who has indicated his/her understanding and acceptance.   Dental Advisory Given  Plan Discussed with: CRNA  Anesthesia Plan Comments:         Anesthesia Quick Evaluation

## 2018-05-04 ENCOUNTER — Encounter: Payer: Self-pay | Admitting: Ophthalmology

## 2018-06-22 DIAGNOSIS — H52209 Unspecified astigmatism, unspecified eye: Secondary | ICD-10-CM | POA: Diagnosis not present

## 2018-06-22 DIAGNOSIS — H524 Presbyopia: Secondary | ICD-10-CM | POA: Diagnosis not present

## 2018-06-22 DIAGNOSIS — H5203 Hypermetropia, bilateral: Secondary | ICD-10-CM | POA: Diagnosis not present

## 2018-08-25 ENCOUNTER — Telehealth: Payer: Self-pay

## 2018-08-25 NOTE — Telephone Encounter (Signed)
LVMTCB to find out why she needs a new machine as we have documented she is already using one. Awaiting call.

## 2018-08-25 NOTE — Telephone Encounter (Signed)
Will call back with more info from insurance and where they cover to send this ( medical supply store). Awaiting call.

## 2018-08-25 NOTE — Telephone Encounter (Signed)
Requesting we do referral to [facility] for Nebulizer machine? I believe we may be able to give a DME RX and she take to Med supply store.

## 2018-08-26 NOTE — Telephone Encounter (Signed)
[facility] sent form over for nebulizer.

## 2018-08-29 DIAGNOSIS — J449 Chronic obstructive pulmonary disease, unspecified: Secondary | ICD-10-CM | POA: Diagnosis not present

## 2018-11-01 ENCOUNTER — Other Ambulatory Visit: Payer: Self-pay

## 2018-11-01 ENCOUNTER — Encounter: Payer: Self-pay | Admitting: Internal Medicine

## 2018-11-01 ENCOUNTER — Ambulatory Visit (INDEPENDENT_AMBULATORY_CARE_PROVIDER_SITE_OTHER): Payer: Medicare HMO | Admitting: Internal Medicine

## 2018-11-01 VITALS — BP 112/78 | HR 68 | Ht 62.0 in | Wt 183.0 lb

## 2018-11-01 DIAGNOSIS — M65311 Trigger thumb, right thumb: Secondary | ICD-10-CM

## 2018-11-01 NOTE — Progress Notes (Signed)
Date:  11/01/2018   Name:  Monica Campbell   DOB:  [birth year]   MRN:  [id number]   Chief Complaint: Hand Pain (Patient hit finger ( Rt THUMB) on chair in her kitchen about 6 weeks ago. Still swollen and unable to bend at knuckle.)  Hand Injury   Incident onset: 5 weeks ago. The incident occurred at home. The injury mechanism was a direct blow. The pain is present in the right hand. The quality of the pain is described as aching. The pain does not radiate. The pain is mild. Pertinent negatives include no chest pain. The symptoms are aggravated by movement.    Review of Systems  Constitutional: Negative for chills, fatigue and fever.  Cardiovascular: Negative for chest pain and palpitations.  Musculoskeletal: Positive for arthralgias.       And inability to flex the right thumb DIP    Patient Active Problem List   Diagnosis Date Noted  . Foot pain, left 12/17/2017  . Elevated TSH 05/03/2017  . Osteoporosis 03/24/2016  . Pulmonary emphysema ([place]) 08/05/2015  . Chronic dermatitis 07/09/2015  . Allergic rhinitis, seasonal 12/12/2014  . Breast screening 12/12/2014  . Gastro-esophageal reflux disease without esophagitis 12/12/2014  . Mixed hyperlipidemia 12/12/2014  . Degenerative arthritis of hip 12/12/2014  . Bilateral hearing loss 07/15/2014    Allergies  Allergen Reactions  . Augmentin [Amoxicillin-Pot Clavulanate] Itching    Itching all over.  . Codeine Rash    Past Surgical History:  Procedure Laterality Date  . CATARACT EXTRACTION W/PHACO Left 02/02/2018   Procedure: CATARACT EXTRACTION PHACO AND INTRAOCULAR LENS PLACEMENT ([place]) LEFT  MALYUGIN;  Surgeon: Leandrew Koyanagi, MD;  Location: [place];  Service: Ophthalmology;  Laterality: Left;  . CATARACT EXTRACTION W/PHACO Right 05/03/2018   Procedure: CATARACT EXTRACTION PHACO AND INTRAOCULAR LENS PLACEMENT ([place]) RIGHT  IVA/TOPICAL;  Surgeon: Leandrew Koyanagi, MD;  Location: [place];   Service: Ophthalmology;  Laterality: Right;  . MINOR HEMORRHOIDECTOMY      Social History   Tobacco Use  . Smoking status: Former Smoker    Packs/day: 0.50    Years: 30.00    Pack years: 15.00    Types: Cigarettes    Last attempt to quit: 09/07/2000    Years since quitting: 18.1  . Smokeless tobacco: Never Used  . Tobacco comment: smoking cessation materials not required  Substance Use Topics  . Alcohol use: Yes    Alcohol/week: 1.0 standard drinks    Types: 1 Glasses of wine per week    Comment: social  . Drug use: No     Medication list has been reviewed and updated.  Current Meds  Medication Sig  . albuterol (PROAIR HFA) 108 (90 Base) MCG/ACT inhaler Inhale 2 puffs into the lungs every 6 (six) hours as needed.  Marland Kitchen albuterol (PROVENTIL HFA) 108 (90 Base) MCG/ACT inhaler Inhale 2 puffs into the lungs every 6 (six) hours as needed for wheezing or shortness of breath.  Marland Kitchen albuterol (PROVENTIL) (2.5 MG/3ML) 0.083% nebulizer solution Take 3 mLs by nebulization every 6 (six) hours as needed for wheezing or shortness of breath.  Marland Kitchen aspirin 81 MG chewable tablet Chew 1 tablet by mouth daily.  . Calcium Carbonate-Vit D-Min (CALTRATE 600+D PLUS MINERALS) 600-800 MG-UNIT CHEW Chew 1 tablet by mouth daily.  . cetirizine (ZYRTEC) 10 MG tablet Take 10 mg by mouth daily.  . clobetasol cream (TEMOVATE) 8.67 % Apply 1 application topically as needed.  . docusate sodium (COLACE) 100 MG  capsule Take 1 capsule by mouth daily.  . fluticasone (FLONASE) 50 MCG/ACT nasal spray Place 2 sprays into both nostrils daily.  . Fluticasone-Salmeterol (ADVAIR) 250-50 MCG/DOSE AEPB Inhale 1 puff into the lungs 2 (two) times daily.  . meloxicam (MOBIC) 15 MG tablet Take 15 mg by mouth daily.  . Multiple Vitamin (MULTIVITAMIN) tablet Take 1 tablet by mouth daily.  Marland Kitchen nystatin (MYCOSTATIN) 100000 UNIT/ML suspension Take 5 mLs by mouth 4 (four) times daily as needed.  Marland Kitchen omeprazole (PRILOSEC) 20 MG capsule Take 20 mg  by mouth daily.  . ranitidine (ZANTAC) 150 MG tablet Take 150 mg by mouth 2 (two) times daily.  . simvastatin (ZOCOR) 10 MG tablet Take 1 tablet (10 mg total) by mouth at bedtime.  . VENTOLIN HFA 108 (90 Base) MCG/ACT inhaler INHALE TWO PUFFS BY MOUTH EVERY 6 HOURS AS NEEDED FOR WHEEZING OR  SHORTNESS  OF  BREATH  . vitamin B-12 (CYANOCOBALAMIN) 250 MCG tablet Take by mouth.    PHQ 2/9 Scores 11/01/2018 03/23/2018 03/22/2018 03/15/2017  PHQ - 2 Score 0 0 0 0  PHQ- 9 Score - 0 - -    Physical Exam Vitals signs and nursing note reviewed.  Constitutional:      General: She is not in acute distress.    Appearance: She is well-developed.  HENT:     Head: Normocephalic and atraumatic.  Neck:     Musculoskeletal: Normal range of motion and neck supple.  Cardiovascular:     Rate and Rhythm: Normal rate and regular rhythm.  Pulmonary:     Effort: Pulmonary effort is normal. No respiratory distress.  Musculoskeletal: Normal range of motion.     Comments: Right thumb PIP swollen, tender Right thumb DIP in extension - pt unable to flex  Skin:    General: Skin is warm and dry.     Findings: No rash.  Neurological:     Mental Status: She is alert and oriented to person, place, and time.  Psychiatric:        Behavior: Behavior normal.        Thought Content: Thought content normal.     BP 112/78   Pulse 68   Ht 5\' 2"  (1.575 m)   Wt 183 lb (83 kg)   BMI 33.47 kg/m   Assessment and Plan: 1. Trigger finger of right thumb Continue supportive care, avoid further injury - Ambulatory referral to Orthopedic Surgery   Partially dictated using Dragon software. Any errors are unintentional.  Halina Maidens, MD [facility]  11/01/2018

## 2018-11-07 DIAGNOSIS — M65311 Trigger thumb, right thumb: Secondary | ICD-10-CM | POA: Diagnosis not present

## 2018-11-22 ENCOUNTER — Telehealth: Payer: Self-pay

## 2018-11-22 NOTE — Telephone Encounter (Signed)
Patient has symptoms of yeast inf after steroids. Patient has asked if she can take Diflucan that she has at home. She has 2 pills and they exp 12/2018. I advised that I will be sure that it is okay with PCP and call her back. She has not started anymore new meds at this time from any other doctor.

## 2018-11-22 NOTE — Telephone Encounter (Signed)
She can take them - I advise to take 1 tablet and if sx persist, take the second tablet 2 days later.

## 2018-11-24 ENCOUNTER — Encounter: Payer: Self-pay | Admitting: Internal Medicine

## 2018-11-24 ENCOUNTER — Ambulatory Visit (INDEPENDENT_AMBULATORY_CARE_PROVIDER_SITE_OTHER): Payer: Medicare HMO | Admitting: Internal Medicine

## 2018-11-24 ENCOUNTER — Other Ambulatory Visit: Payer: Self-pay

## 2018-11-24 VITALS — BP 118/76 | HR 87 | Temp 98.7°F | Ht 62.0 in | Wt 182.0 lb

## 2018-11-24 DIAGNOSIS — N3001 Acute cystitis with hematuria: Secondary | ICD-10-CM | POA: Diagnosis not present

## 2018-11-24 DIAGNOSIS — J439 Emphysema, unspecified: Secondary | ICD-10-CM

## 2018-11-24 LAB — POC URINALYSIS WITH MICROSCOPIC (NON AUTO)MANUAL RESULT
BILIRUBIN UA: NEGATIVE
Crystals: 0
EPITHELIAL CELLS, URINE PER MICROSCOPY: 2
Glucose, UA: NEGATIVE
KETONES UA: NEGATIVE
Mucus, UA: 0
Nitrite, UA: NEGATIVE
Protein, UA: NEGATIVE
RBC: 2 M/uL — AB (ref 4.04–5.48)
Spec Grav, UA: 1.02 (ref 1.010–1.025)
UROBILINOGEN UA: 0.2 U/dL
WBC Casts, UA: 15
pH, UA: 5 (ref 5.0–8.0)

## 2018-11-24 MED ORDER — CEPHALEXIN 500 MG PO CAPS: 500.0000 mg | ORAL_CAPSULE | Freq: Four times a day (QID) | ORAL | 0 refills | Status: AC

## 2018-11-24 NOTE — Patient Instructions (Signed)
Urinary Tract Infection, Adult A urinary tract infection (UTI) is an infection of any part of the urinary tract. The urinary tract includes:  The kidneys.  The ureters.  The bladder.  The urethra. These organs make, store, and get rid of pee (urine) in the body. What are the causes? This is caused by germs (bacteria) in your genital area. These germs grow and cause swelling (inflammation) of your urinary tract. What increases the risk? You are more likely to develop this condition if:  You have a small, thin tube (catheter) to drain pee.  You cannot control when you pee or poop (incontinence).  You are female, and: ? You use these methods to prevent pregnancy: ? A medicine that kills sperm (spermicide). ? A device that blocks sperm (diaphragm). ? You have low levels of a female hormone (estrogen). ? You are pregnant.  You have genes that add to your risk.  You are sexually active.  You take antibiotic medicines.  You have trouble peeing because of: ? A prostate that is bigger than normal, if you are female. ? A blockage in the part of your body that drains pee from the bladder (urethra). ? A kidney stone. ? A nerve condition that affects your bladder (neurogenic bladder). ? Not getting enough to drink. ? Not peeing often enough.  You have other conditions, such as: ? Diabetes. ? A weak disease-fighting system (immune system). ? Sickle cell disease. ? Gout. ? Injury of the spine. What are the signs or symptoms? Symptoms of this condition include:  Needing to pee right away (urgently).  Peeing often.  Peeing small amounts often.  Pain or burning when peeing.  Blood in the pee.  Pee that smells bad or not like normal.  Trouble peeing.  Pee that is cloudy.  Fluid coming from the vagina, if you are female.  Pain in the belly or lower back. Other symptoms include:  Throwing up (vomiting).  No urge to eat.  Feeling mixed up (confused).  Being tired  and grouchy (irritable).  A fever.  Watery poop (diarrhea). How is this treated? This condition may be treated with:  Antibiotic medicine.  Other medicines.  Drinking enough water. Follow these instructions at home:  Medicines  Take over-the-counter and prescription medicines only as told by your doctor.  If you were prescribed an antibiotic medicine, take it as told by your doctor. Do not stop taking it even if you start to feel better. General instructions  Make sure you: ? Pee until your bladder is empty. ? Do not hold pee for a long time. ? Empty your bladder after sex. ? Wipe from front to back after pooping if you are a female. Use each tissue one time when you wipe.  Drink enough fluid to keep your pee pale yellow.  Keep all follow-up visits as told by your doctor. This is important. Contact a doctor if:  You do not get better after 1-2 days.  Your symptoms go away and then come back. Get help right away if:  You have very bad back pain.  You have very bad pain in your lower belly.  You have a fever.  You are sick to your stomach (nauseous).  You are throwing up. Summary  A urinary tract infection (UTI) is an infection of any part of the urinary tract.  This condition is caused by germs in your genital area.  There are many risk factors for a UTI. These include having a small, thin   tube to drain pee and not being able to control when you pee or poop.  Treatment includes antibiotic medicines for germs.  Drink enough fluid to keep your pee pale yellow. This information is not intended to replace advice given to you by your health care provider. Make sure you discuss any questions you have with your health care provider. Document Released: 02/10/2008 Document Revised: 03/03/2018 Document Reviewed: 03/03/2018 Elsevier Interactive Patient Education  2019 Elsevier Inc.  

## 2018-11-24 NOTE — Progress Notes (Signed)
Date:  11/24/2018   Name:  Monica Campbell   DOB:  19-Dec-1940   MRN:  [id number]   Chief Complaint: Urinary Tract Infection (Burning, some itching, and pressure after urinating. )  Urinary Tract Infection   This is a new problem. The current episode started in the past 7 days. The problem occurs every urination. The quality of the pain is described as burning. The pain is mild. There has been no fever. Associated symptoms include frequency and urgency. Pertinent negatives include no chills or hematuria.   COPD - using inhalers as needed with no change in mild chronic cough, shortness of breath and mild wheezing.  Review of Systems  Constitutional: Negative for chills, fatigue and fever.  Respiratory: Positive for cough and shortness of breath. Negative for chest tightness and wheezing.   Cardiovascular: Negative for chest pain, palpitations and leg swelling.  Gastrointestinal: Positive for constipation. Negative for abdominal pain and diarrhea.  Genitourinary: Positive for dysuria, frequency and urgency. Negative for hematuria.  Neurological: Negative for dizziness and headaches.    Patient Active Problem List   Diagnosis Date Noted  . Foot pain, left 12/17/2017  . Elevated TSH 05/03/2017  . Osteoporosis 03/24/2016  . Pulmonary emphysema ([place]) 08/05/2015  . Chronic dermatitis 07/09/2015  . Allergic rhinitis, seasonal 12/12/2014  . Breast screening 12/12/2014  . Gastro-esophageal reflux disease without esophagitis 12/12/2014  . Mixed hyperlipidemia 12/12/2014  . Degenerative arthritis of hip 12/12/2014  . Bilateral hearing loss 07/15/2014    Allergies  Allergen Reactions  . Augmentin [Amoxicillin-Pot Clavulanate] Itching    Itching all over.  . Codeine Rash    Past Surgical History:  Procedure Laterality Date  . CATARACT EXTRACTION W/PHACO Left 02/02/2018   Procedure: CATARACT EXTRACTION PHACO AND INTRAOCULAR LENS PLACEMENT ([place]) LEFT  MALYUGIN;  Surgeon: Leandrew Koyanagi, MD;  Location: [facility];  Service: Ophthalmology;  Laterality: Left;  . CATARACT EXTRACTION W/PHACO Right 05/03/2018   Procedure: CATARACT EXTRACTION PHACO AND INTRAOCULAR LENS PLACEMENT ([place]) RIGHT  IVA/TOPICAL;  Surgeon: Leandrew Koyanagi, MD;  Location: [facility];  Service: Ophthalmology;  Laterality: Right;  . MINOR HEMORRHOIDECTOMY      Social History   Tobacco Use  . Smoking status: Former Smoker    Packs/day: 0.50    Years: 30.00    Pack years: 15.00    Types: Cigarettes    Last attempt to quit: 09/07/2000    Years since quitting: 18.2  . Smokeless tobacco: Never Used  . Tobacco comment: smoking cessation materials not required  Substance Use Topics  . Alcohol use: Yes    Alcohol/week: 1.0 standard drinks    Types: 1 Glasses of wine per week    Comment: social  . Drug use: No     Medication list has been reviewed and updated.  Current Meds  Medication Sig  . albuterol (PROVENTIL HFA) 108 (90 Base) MCG/ACT inhaler Inhale 2 puffs into the lungs every 6 (six) hours as needed for wheezing or shortness of breath.  Marland Kitchen albuterol (PROVENTIL) (2.5 MG/3ML) 0.083% nebulizer solution Take 3 mLs by nebulization every 6 (six) hours as needed for wheezing or shortness of breath.  Marland Kitchen aspirin 81 MG chewable tablet Chew 1 tablet by mouth daily.  . Calcium Carbonate-Vit D-Min (CALTRATE 600+D PLUS MINERALS) 600-800 MG-UNIT CHEW Chew 1 tablet by mouth daily.  . cetirizine (ZYRTEC) 10 MG tablet Take 10 mg by mouth daily.  . clobetasol cream (TEMOVATE) 9.38 % Apply 1 application topically as needed.  Marland Kitchen  docusate sodium (COLACE) 100 MG capsule Take 1 capsule by mouth daily.  . fluticasone (FLONASE) 50 MCG/ACT nasal spray Place 2 sprays into both nostrils daily.  . Fluticasone-Salmeterol (ADVAIR) 250-50 MCG/DOSE AEPB Inhale 1 puff into the lungs 2 (two) times daily.  . meloxicam (MOBIC) 15 MG tablet Take 15 mg by mouth daily.  . Multiple Vitamin (MULTIVITAMIN)  tablet Take 1 tablet by mouth daily.  Marland Kitchen nystatin (MYCOSTATIN) 100000 UNIT/ML suspension Take 5 mLs by mouth 4 (four) times daily as needed.  Marland Kitchen omeprazole (PRILOSEC) 20 MG capsule Take 20 mg by mouth daily.  . ranitidine (ZANTAC) 150 MG tablet Take 150 mg by mouth 2 (two) times daily.  . simvastatin (ZOCOR) 10 MG tablet Take 1 tablet (10 mg total) by mouth at bedtime.  . VENTOLIN HFA 108 (90 Base) MCG/ACT inhaler INHALE TWO PUFFS BY MOUTH EVERY 6 HOURS AS NEEDED FOR WHEEZING OR  SHORTNESS  OF  BREATH  . vitamin B-12 (CYANOCOBALAMIN) 250 MCG tablet Take by mouth.    PHQ 2/9 Scores 11/01/2018 03/23/2018 03/22/2018 03/15/2017  PHQ - 2 Score 0 0 0 0  PHQ- 9 Score - 0 - -    Physical Exam Vitals signs and nursing note reviewed.  Constitutional:      Appearance: Normal appearance. She is well-developed.  Cardiovascular:     Rate and Rhythm: Normal rate and regular rhythm.     Heart sounds: Normal heart sounds.  Pulmonary:     Effort: Pulmonary effort is normal. No respiratory distress.     Breath sounds: Examination of the right-upper field reveals wheezing. Examination of the left-upper field reveals wheezing. Wheezing present.  Abdominal:     General: Bowel sounds are normal.     Palpations: Abdomen is soft. There is no mass.     Tenderness: There is abdominal tenderness in the suprapubic area. There is no right CVA tenderness, left CVA tenderness, guarding or rebound.  Neurological:     Mental Status: She is alert.     Wt Readings from Last 3 Encounters:  11/24/18 182 lb (82.6 kg)  11/01/18 183 lb (83 kg)  05/03/18 180 lb (81.6 kg)    BP 118/76   Pulse 87   Temp 98.7 F (37.1 C) (Oral)   Ht 5\' 2"  (1.575 m)   Wt 182 lb (82.6 kg)   SpO2 94%   BMI 33.29 kg/m   Assessment and Plan: 1. Acute cystitis with hematuria Increase fluids, return if needed - POC urinalysis w microscopic (non auto) - cephALEXin (KEFLEX) 500 MG capsule; Take 1 capsule (500 mg total) by mouth 4 (four)  times daily for 7 days.  Dispense: 28 capsule; Refill: 0  2. Pulmonary emphysema, unspecified emphysema type ([place]) Stable, continue medications   Partially dictated using Editor, commissioning. Any errors are unintentional.  Halina Maidens, MD [facility]  11/24/2018

## 2018-12-01 ENCOUNTER — Other Ambulatory Visit: Payer: Self-pay | Admitting: Internal Medicine

## 2018-12-01 ENCOUNTER — Telehealth: Payer: Self-pay

## 2018-12-01 DIAGNOSIS — N3000 Acute cystitis without hematuria: Secondary | ICD-10-CM

## 2018-12-01 MED ORDER — NITROFURANTOIN MONOHYD MACRO 100 MG PO CAPS: 100.0000 mg | ORAL_CAPSULE | Freq: Two times a day (BID) | ORAL | 0 refills | Status: AC

## 2018-12-01 NOTE — Telephone Encounter (Signed)
Sent in [place] to [facility]. Take for 5 days.

## 2018-12-01 NOTE — Telephone Encounter (Signed)
Patient called saying she was seen for UTI. Was given cephalexin and it is not giving her any relief.   Wants to know if she can try something different for this.  Please Advise.

## 2018-12-02 NOTE — Telephone Encounter (Signed)
Patient informed. Said she had a blistered dry spot on her vagina. It itches sometimes but she has to use a water bottle to spray and clean instead of wipe because it hurts when she wipes. Told her to use some diaper rash cram and dab it on the area for a few days and just be careful around the area (rubbing) so it can heal. She verbalized understanding.

## 2019-01-20 ENCOUNTER — Other Ambulatory Visit: Payer: Self-pay

## 2019-01-20 ENCOUNTER — Encounter: Payer: Self-pay | Admitting: Internal Medicine

## 2019-01-20 ENCOUNTER — Ambulatory Visit (INDEPENDENT_AMBULATORY_CARE_PROVIDER_SITE_OTHER): Payer: Medicare HMO | Admitting: Internal Medicine

## 2019-01-20 VITALS — BP 130/80 | HR 83 | Temp 98.4°F | Ht 62.0 in | Wt 185.0 lb

## 2019-01-20 DIAGNOSIS — L03211 Cellulitis of face: Secondary | ICD-10-CM

## 2019-01-20 MED ORDER — CEPHALEXIN 500 MG PO CAPS: 500.0000 mg | ORAL_CAPSULE | Freq: Four times a day (QID) | ORAL | 0 refills | Status: AC

## 2019-01-20 MED ORDER — PREDNISONE 10 MG PO TABS: 10.0000 mg | ORAL_TABLET | ORAL | 0 refills | Status: AC

## 2019-01-20 NOTE — Progress Notes (Signed)
Date:  01/20/2019   Name:  Monica Campbell   DOB:  10/02/1940   MRN:  [id number]   Chief Complaint: Edema (Patient was working in yard yesterday and swelling started today on face and eyes. No trouble breathing or swallowing. )  Facial swelling - she worked in the yard yesterday.  Did not notice an insect bite or sting but after coming inside, her forehead near the hair line was itching.  She felt a small blister there but no bleeding. Today she has more swelling down around her eyes, continued itching and warmth.  She denies fever, chills, change in vision, trouble swallowing.  Mild chronic COPD unchanged.  She took zyrtec today.  Review of Systems  Constitutional: Negative for chills, fatigue and fever.  HENT: Positive for facial swelling. Negative for hearing loss, trouble swallowing and voice change.   Eyes: Negative for discharge, redness, itching and visual disturbance.  Respiratory: Positive for cough and shortness of breath. Negative for chest tightness and wheezing.   Cardiovascular: Negative for chest pain.  Allergic/Immunologic: Positive for environmental allergies.  Neurological: Negative for dizziness and headaches.    Patient Active Problem List   Diagnosis Date Noted  . Foot pain, left 12/17/2017  . Elevated TSH 05/03/2017  . Osteoporosis 03/24/2016  . Pulmonary emphysema ([place]) 08/05/2015  . Chronic dermatitis 07/09/2015  . Allergic rhinitis, seasonal 12/12/2014  . Breast screening 12/12/2014  . Gastro-esophageal reflux disease without esophagitis 12/12/2014  . Mixed hyperlipidemia 12/12/2014  . Degenerative arthritis of hip 12/12/2014  . Bilateral hearing loss 07/15/2014    Allergies  Allergen Reactions  . Augmentin [Amoxicillin-Pot Clavulanate] Itching    Itching all over.  . Codeine Rash    Past Surgical History:  Procedure Laterality Date  . CATARACT EXTRACTION W/PHACO Left 02/02/2018   Procedure: CATARACT EXTRACTION PHACO AND INTRAOCULAR LENS  PLACEMENT ([place]) LEFT  MALYUGIN;  Surgeon: Leandrew Koyanagi, MD;  Location: [facility];  Service: Ophthalmology;  Laterality: Left;  . CATARACT EXTRACTION W/PHACO Right 05/03/2018   Procedure: CATARACT EXTRACTION PHACO AND INTRAOCULAR LENS PLACEMENT ([place]) RIGHT  IVA/TOPICAL;  Surgeon: Leandrew Koyanagi, MD;  Location: [facility];  Service: Ophthalmology;  Laterality: Right;  . MINOR HEMORRHOIDECTOMY      Social History   Tobacco Use  . Smoking status: Former Smoker    Packs/day: 0.50    Years: 30.00    Pack years: 15.00    Types: Cigarettes    Last attempt to quit: 09/07/2000    Years since quitting: 18.3  . Smokeless tobacco: Never Used  . Tobacco comment: smoking cessation materials not required  Substance Use Topics  . Alcohol use: Yes    Alcohol/week: 1.0 standard drinks    Types: 1 Glasses of wine per week    Comment: social  . Drug use: No     Medication list has been reviewed and updated.  Current Meds  Medication Sig  . albuterol (PROVENTIL HFA) 108 (90 Base) MCG/ACT inhaler Inhale 2 puffs into the lungs every 6 (six) hours as needed for wheezing or shortness of breath.  Marland Kitchen albuterol (PROVENTIL) (2.5 MG/3ML) 0.083% nebulizer solution Take 3 mLs by nebulization every 6 (six) hours as needed for wheezing or shortness of breath.  Marland Kitchen aspirin 81 MG chewable tablet Chew 1 tablet by mouth daily.  . Calcium Carbonate-Vit D-Min (CALTRATE 600+D PLUS MINERALS) 600-800 MG-UNIT CHEW Chew 1 tablet by mouth daily.  . cetirizine (ZYRTEC) 10 MG tablet Take 10 mg by mouth daily.  Marland Kitchen  clobetasol cream (TEMOVATE) 6.57 % Apply 1 application topically as needed.  . docusate sodium (COLACE) 100 MG capsule Take 1 capsule by mouth daily.  . fluticasone (FLONASE) 50 MCG/ACT nasal spray Place 2 sprays into both nostrils daily.  . Fluticasone-Salmeterol (ADVAIR) 250-50 MCG/DOSE AEPB Inhale 1 puff into the lungs 2 (two) times daily.  . meloxicam (MOBIC) 15 MG tablet Take 15 mg by  mouth daily.  . Multiple Vitamin (MULTIVITAMIN) tablet Take 1 tablet by mouth daily.  Marland Kitchen nystatin (MYCOSTATIN) 100000 UNIT/ML suspension Take 5 mLs by mouth 4 (four) times daily as needed.  Marland Kitchen omeprazole (PRILOSEC) 20 MG capsule Take 20 mg by mouth daily.  . ranitidine (ZANTAC) 150 MG tablet Take 150 mg by mouth 2 (two) times daily.  . simvastatin (ZOCOR) 10 MG tablet Take 1 tablet (10 mg total) by mouth at bedtime.  . VENTOLIN HFA 108 (90 Base) MCG/ACT inhaler INHALE TWO PUFFS BY MOUTH EVERY 6 HOURS AS NEEDED FOR WHEEZING OR  SHORTNESS  OF  BREATH  . vitamin B-12 (CYANOCOBALAMIN) 250 MCG tablet Take by mouth.    PHQ 2/9 Scores 01/20/2019 11/01/2018 03/23/2018 03/22/2018  PHQ - 2 Score 0 0 0 0  PHQ- 9 Score - - 0 -    BP Readings from Last 3 Encounters:  01/20/19 130/80  11/24/18 118/76  11/01/18 112/78    Physical Exam Vitals signs and nursing note reviewed.  Constitutional:      General: She is not in acute distress.    Appearance: She is well-developed. She is not ill-appearing.  HENT:     Head: Normocephalic and atraumatic.     Right Ear: Tympanic membrane and ear canal normal.     Left Ear: Tympanic membrane and ear canal normal.     Nose: Nose normal.     Mouth/Throat:     Mouth: Mucous membranes are moist.     Palate: No mass and lesions.     Pharynx: No posterior oropharyngeal erythema or uvula swelling.  Eyes:     Pupils: Pupils are equal, round, and reactive to light.  Neck:     Musculoskeletal: Normal range of motion.  Cardiovascular:     Rate and Rhythm: Normal rate and regular rhythm.  Pulmonary:     Effort: Pulmonary effort is normal. No respiratory distress.     Breath sounds: No wheezing or rhonchi.  Musculoskeletal: Normal range of motion.  Lymphadenopathy:     Cervical: No cervical adenopathy.  Skin:    General: Skin is warm and dry.     Findings: No rash.     Comments: Area of erythema right forehead hairline - no discrete puncture site or bite.  Edema  of forehead extending down around both eyes with moderate periorbital edema.  No bruising or other discoloration  Neurological:     Mental Status: She is alert and oriented to person, place, and time.  Psychiatric:        Behavior: Behavior normal.        Thought Content: Thought content normal.     Wt Readings from Last 3 Encounters:  01/20/19 185 lb (83.9 kg)  11/24/18 182 lb (82.6 kg)  11/01/18 183 lb (83 kg)    BP 130/80   Pulse 83   Temp 98.4 F (36.9 C) (Oral)   Ht 5\' 2"  (1.575 m)   Wt 185 lb (83.9 kg)   SpO2 95%   BMI 33.84 kg/m   Assessment and Plan: 1. Cellulitis of face Secondary to  unknown insult - likely insect bite Continue zyrtec - predniSONE (DELTASONE) 10 MG tablet; Take 1 tablet (10 mg total) by mouth as directed for 6 days. Take 6,5,4,3,2,1 then stop  Dispense: 21 tablet; Refill: 0 - cephALEXin (KEFLEX) 500 MG capsule; Take 1 capsule (500 mg total) by mouth 4 (four) times daily for 10 days.  Dispense: 40 capsule; Refill: 0   Partially dictated using Editor, commissioning. Any errors are unintentional.  Halina Maidens, MD [facility]  01/20/2019

## 2019-02-01 DIAGNOSIS — M65311 Trigger thumb, right thumb: Secondary | ICD-10-CM | POA: Diagnosis not present

## 2019-03-06 ENCOUNTER — Telehealth: Payer: Self-pay

## 2019-03-06 ENCOUNTER — Other Ambulatory Visit: Payer: Self-pay | Admitting: Internal Medicine

## 2019-03-06 DIAGNOSIS — L309 Dermatitis, unspecified: Secondary | ICD-10-CM

## 2019-03-06 MED ORDER — PREDNISONE 10 MG PO TABS: ORAL_TABLET | ORAL | 0 refills | Status: DC

## 2019-03-06 NOTE — Telephone Encounter (Signed)
Called and left VM informing patient of this. Told her to call if she has any questions.

## 2019-03-06 NOTE — Telephone Encounter (Signed)
Sent to [facility]

## 2019-03-06 NOTE — Telephone Encounter (Signed)
Patient called stating she was bit by another bug on her forehead while sitting on the porch Friday, 03/03/2019.  Her forehead is swollen, but are eyes are not swollen this time yet. Wants to know if we can RF her prednisone for this before the swelling gets worse.  Please advise.

## 2019-03-27 ENCOUNTER — Encounter: Payer: 59 | Admitting: Internal Medicine

## 2019-03-28 ENCOUNTER — Ambulatory Visit
Admission: RE | Admit: 2019-03-28 | Discharge: 2019-03-28 | Disposition: A | Discharge status: Home / Self Care | Payer: Medicare HMO | Attending: Internal Medicine | Admitting: Internal Medicine

## 2019-03-28 ENCOUNTER — Ambulatory Visit (INDEPENDENT_AMBULATORY_CARE_PROVIDER_SITE_OTHER): Payer: Medicare HMO | Admitting: Internal Medicine

## 2019-03-28 ENCOUNTER — Ambulatory Visit
Admission: RE | Admit: 2019-03-28 | Discharge: 2019-03-28 | Disposition: A | Discharge status: Home / Self Care | Payer: Medicare HMO | Source: Ambulatory Visit | Attending: Internal Medicine | Admitting: Internal Medicine

## 2019-03-28 ENCOUNTER — Other Ambulatory Visit: Payer: Self-pay | Admitting: Internal Medicine

## 2019-03-28 ENCOUNTER — Other Ambulatory Visit: Admission: RE | Admit: 2019-03-28 | Payer: Medicare HMO | Source: Home / Self Care

## 2019-03-28 ENCOUNTER — Other Ambulatory Visit: Payer: Self-pay

## 2019-03-28 ENCOUNTER — Encounter: Payer: Self-pay | Admitting: Internal Medicine

## 2019-03-28 VITALS — BP 118/68 | HR 80 | Ht 62.0 in | Wt 182.0 lb

## 2019-03-28 DIAGNOSIS — K219 Gastro-esophageal reflux disease without esophagitis: Secondary | ICD-10-CM | POA: Diagnosis not present

## 2019-03-28 DIAGNOSIS — J439 Emphysema, unspecified: Secondary | ICD-10-CM

## 2019-03-28 DIAGNOSIS — M1611 Unilateral primary osteoarthritis, right hip: Secondary | ICD-10-CM | POA: Diagnosis not present

## 2019-03-28 DIAGNOSIS — E782 Mixed hyperlipidemia: Secondary | ICD-10-CM | POA: Diagnosis not present

## 2019-03-28 DIAGNOSIS — Z1231 Encounter for screening mammogram for malignant neoplasm of breast: Secondary | ICD-10-CM | POA: Diagnosis not present

## 2019-03-28 DIAGNOSIS — M810 Age-related osteoporosis without current pathological fracture: Secondary | ICD-10-CM | POA: Diagnosis not present

## 2019-03-28 DIAGNOSIS — Z0000 Encounter for general adult medical examination without abnormal findings: Secondary | ICD-10-CM

## 2019-03-28 LAB — POCT URINALYSIS DIPSTICK
Bilirubin, UA: NEGATIVE
Blood, UA: NEGATIVE
Glucose, UA: NEGATIVE
Ketones, UA: NEGATIVE
Leukocytes, UA: NEGATIVE
Nitrite, UA: NEGATIVE
Protein, UA: NEGATIVE
Spec Grav, UA: 1.015 (ref 1.010–1.025)
Urobilinogen, UA: 0.2 E.U./dL
pH, UA: 6 (ref 5.0–8.0)

## 2019-03-28 MED ORDER — ALBUTEROL SULFATE HFA 108 (90 BASE) MCG/ACT IN AERS: 2.0000 | INHALATION_SPRAY | RESPIRATORY_TRACT | 3 refills | Status: DC | PRN

## 2019-03-28 MED ORDER — SIMVASTATIN 10 MG PO TABS: 10.0000 mg | ORAL_TABLET | Freq: Every day | ORAL | 3 refills | Status: DC

## 2019-03-28 MED ORDER — FLUTICASONE-SALMETEROL 250-50 MCG/DOSE IN AEPB: 1.0000 | INHALATION_SPRAY | Freq: Two times a day (BID) | RESPIRATORY_TRACT | 3 refills | Status: DC

## 2019-03-28 MED ORDER — FLUTICASONE PROPIONATE 50 MCG/ACT NA SUSP: 2.0000 | Freq: Every day | NASAL | 3 refills | Status: DC

## 2019-03-28 NOTE — Progress Notes (Signed)
Date:  03/28/2019   Name:  Monica Campbell   DOB:  1941-08-27   MRN:  [id number]   Chief Complaint: Annual Exam (Breast Exam.) Monica Campbell is a 78 y.o. female who presents today for her Complete Annual Exam. She feels fairly well. She reports exercising some but is bothered by her right hip. She reports she is sleeping fairly well.   Mammogram - scheduled for August immunizations - up to date DEXA - 2019  Hyperlipidemia This is a chronic problem. The problem is controlled. Pertinent negatives include no chest pain or shortness of breath. Current antihyperlipidemic treatment includes statins. The current treatment provides significant improvement of lipids.  Gastroesophageal Reflux She reports no abdominal pain, no chest pain, no coughing or no wheezing. This is a recurrent problem. The problem occurs occasionally. Pertinent negatives include no fatigue. She has tried a histamine-2 antagonist for the symptoms. The treatment provided significant relief.  Hip Pain  There was no injury mechanism. The pain is present in the right hip. The pain is mild. The symptoms are aggravated by weight bearing (and twisting). She has tried acetaminophen and heat for the symptoms. The treatment provided mild relief.  COPD - has minimal sx but using Advair daily and albuterol MDI and nebs PRN.  Immunizations are up to date.  She has seen Pulmonology.  Review of Systems  Constitutional: Negative for chills, fatigue and fever.  HENT: Positive for hearing loss. Negative for congestion, tinnitus, trouble swallowing and voice change.   Eyes: Negative for visual disturbance.  Respiratory: Negative for cough, chest tightness, shortness of breath and wheezing.   Cardiovascular: Negative for chest pain, palpitations and leg swelling.  Gastrointestinal: Negative for abdominal pain, constipation, diarrhea and vomiting.  Endocrine: Negative for polydipsia and polyuria.  Genitourinary: Negative for dysuria,  frequency, genital sores, vaginal bleeding and vaginal discharge.  Musculoskeletal: Positive for gait problem. Negative for joint swelling. Arthralgias: right hip pain.  Skin: Negative for color change and rash.  Neurological: Negative for dizziness, tremors, light-headedness and headaches.  Hematological: Negative for adenopathy. Does not bruise/bleed easily.  Psychiatric/Behavioral: Negative for dysphoric mood and sleep disturbance. The patient is not nervous/anxious.     Patient Active Problem List   Diagnosis Date Noted  . Foot pain, left 12/17/2017  . Elevated TSH 05/03/2017  . Osteoporosis 03/24/2016  . Pulmonary emphysema ([place]) 08/05/2015  . Chronic dermatitis 07/09/2015  . Allergic rhinitis, seasonal 12/12/2014  . Breast screening 12/12/2014  . Gastro-esophageal reflux disease without esophagitis 12/12/2014  . Mixed hyperlipidemia 12/12/2014  . Degenerative arthritis of hip 12/12/2014  . Bilateral hearing loss 07/15/2014    Allergies  Allergen Reactions  . Augmentin [Amoxicillin-Pot Clavulanate] Itching    Itching all over.  . Codeine Rash    Past Surgical History:  Procedure Laterality Date  . CATARACT EXTRACTION W/PHACO Left 02/02/2018   Procedure: CATARACT EXTRACTION PHACO AND INTRAOCULAR LENS PLACEMENT ([place]) LEFT  MALYUGIN;  Surgeon: Leandrew Koyanagi, MD;  Location: [facility];  Service: Ophthalmology;  Laterality: Left;  . CATARACT EXTRACTION W/PHACO Right 05/03/2018   Procedure: CATARACT EXTRACTION PHACO AND INTRAOCULAR LENS PLACEMENT ([place]) RIGHT  IVA/TOPICAL;  Surgeon: Leandrew Koyanagi, MD;  Location: [facility];  Service: Ophthalmology;  Laterality: Right;  . MINOR HEMORRHOIDECTOMY      Social History   Tobacco Use  . Smoking status: Former Smoker    Packs/day: 0.50    Years: 30.00    Pack years: 15.00    Types: Cigarettes  Quit date: 09/07/2000    Years since quitting: 18.5  . Smokeless tobacco: Never Used  . Tobacco comment:  smoking cessation materials not required  Substance Use Topics  . Alcohol use: Yes    Alcohol/week: 1.0 standard drinks    Types: 1 Glasses of wine per week    Comment: social  . Drug use: No     Medication list has been reviewed and updated.  Current Meds  Medication Sig  . albuterol (PROVENTIL HFA) 108 (90 Base) MCG/ACT inhaler Inhale 2 puffs into the lungs every 6 (six) hours as needed for wheezing or shortness of breath.  Marland Kitchen albuterol (PROVENTIL) (2.5 MG/3ML) 0.083% nebulizer solution Take 3 mLs by nebulization every 6 (six) hours as needed for wheezing or shortness of breath.  Marland Kitchen aspirin 81 MG chewable tablet Chew 1 tablet by mouth daily.  . Calcium Carbonate-Vit D-Min (CALTRATE 600+D PLUS MINERALS) 600-800 MG-UNIT CHEW Chew 1 tablet by mouth daily.  . cetirizine (ZYRTEC) 10 MG tablet Take 10 mg by mouth daily.  . clobetasol cream (TEMOVATE) 4.81 % Apply 1 application topically as needed.  . docusate sodium (COLACE) 100 MG capsule Take 1 capsule by mouth daily.  . fluticasone (FLONASE) 50 MCG/ACT nasal spray Place 2 sprays into both nostrils daily.  . Fluticasone-Salmeterol (ADVAIR) 250-50 MCG/DOSE AEPB Inhale 1 puff into the lungs 2 (two) times daily.  . meloxicam (MOBIC) 15 MG tablet Take 15 mg by mouth daily.  . Multiple Vitamin (MULTIVITAMIN) tablet Take 1 tablet by mouth daily.  Marland Kitchen nystatin (MYCOSTATIN) 100000 UNIT/ML suspension Take 5 mLs by mouth 4 (four) times daily as needed.  Marland Kitchen omeprazole (PRILOSEC) 20 MG capsule Take 20 mg by mouth daily.  . simvastatin (ZOCOR) 10 MG tablet Take 1 tablet (10 mg total) by mouth at bedtime.  . VENTOLIN HFA 108 (90 Base) MCG/ACT inhaler INHALE TWO PUFFS BY MOUTH EVERY 6 HOURS AS NEEDED FOR WHEEZING OR  SHORTNESS  OF  BREATH  . vitamin B-12 (CYANOCOBALAMIN) 250 MCG tablet Take by mouth.    PHQ 2/9 Scores 03/28/2019 01/20/2019 11/01/2018 03/23/2018  PHQ - 2 Score 2 0 0 0  PHQ- 9 Score 6 - - 0    BP Readings from Last 3 Encounters:  03/28/19  118/68  01/20/19 130/80  11/24/18 118/76    Physical Exam Vitals signs and nursing note reviewed.  Constitutional:      General: She is not in acute distress.    Appearance: She is well-developed.  HENT:     Head: Normocephalic and atraumatic.     Right Ear: Tympanic membrane and ear canal normal.     Left Ear: Tympanic membrane and ear canal normal.     Nose:     Right Sinus: No maxillary sinus tenderness.     Left Sinus: No maxillary sinus tenderness.  Eyes:     General: No scleral icterus.       Right eye: No discharge.        Left eye: No discharge.     Conjunctiva/sclera: Conjunctivae normal.  Neck:     Musculoskeletal: Normal range of motion. No erythema.     Thyroid: No thyromegaly.     Vascular: No carotid bruit.  Cardiovascular:     Rate and Rhythm: Normal rate and regular rhythm.     Pulses: Normal pulses.     Heart sounds: Normal heart sounds.  Pulmonary:     Effort: Pulmonary effort is normal. No respiratory distress.     Breath  sounds: No wheezing.  Chest:     Breasts:        Right: No mass, nipple discharge, skin change or tenderness.        Left: No mass, nipple discharge, skin change or tenderness.  Abdominal:     General: Bowel sounds are normal.     Palpations: Abdomen is soft.     Tenderness: There is no abdominal tenderness.  Musculoskeletal:     Right hip: She exhibits decreased range of motion and tenderness. She exhibits no swelling and no crepitus.     Left hip: She exhibits decreased range of motion. She exhibits no tenderness, no swelling and no crepitus.     Right lower leg: No edema.     Left lower leg: No edema.  Lymphadenopathy:     Cervical: No cervical adenopathy.  Skin:    General: Skin is warm and dry.     Findings: No rash.  Neurological:     Mental Status: She is alert and oriented to person, place, and time.     Cranial Nerves: No cranial nerve deficit.     Sensory: No sensory deficit.     Deep Tendon Reflexes: Reflexes are  normal and symmetric.  Psychiatric:        Speech: Speech normal.        Behavior: Behavior normal.        Thought Content: Thought content normal.     Wt Readings from Last 3 Encounters:  03/28/19 182 lb (82.6 kg)  01/20/19 185 lb (83.9 kg)  11/24/18 182 lb (82.6 kg)    BP 118/68   Pulse 80   Ht 5\' 2"  (1.575 m)   Wt 182 lb (82.6 kg)   SpO2 94%   BMI 33.29 kg/m   Assessment and Plan: 1. Annual physical exam Continue healthy diet - POCT urinalysis dipstick - TSH  2. Encounter for screening mammogram for breast cancer scheduled  3. Mixed hyperlipidemia Continue statin therapy - Comprehensive metabolic panel - Lipid panel - simvastatin (ZOCOR) 10 MG tablet; Take 1 tablet (10 mg total) by mouth at bedtime.  Dispense: 90 tablet; Refill: 3  4. Gastro-esophageal reflux disease without esophagitis Controlled with Pepcid - CBC with Differential/Platelet  5. Pulmonary emphysema, unspecified emphysema type ([place]) Stable Continue current therapy Immunizations up to date - Fluticasone-Salmeterol (ADVAIR) 250-50 MCG/DOSE AEPB; Inhale 1 puff into the lungs 2 (two) times daily.  Dispense: 3 each; Refill: 3 - albuterol (VENTOLIN HFA) 108 (90 Base) MCG/ACT inhaler; Inhale 2 puffs into the lungs every 4 (four) hours as needed for wheezing or shortness of breath.  Dispense: 54 g; Refill: 3  6. Age-related osteoporosis without current pathological fracture Continue calcium and vitamin D - VITAMIN D 25 Hydroxy (Vit-D Deficiency, Fractures)  7. Primary osteoarthritis of right hip Suspect mild bursitis as well as OA Depending on Xray will refer or continue tylenol and heat - DG Hip Unilat W OR W/O Pelvis 2-3 Views Right; Future   Partially dictated using Editor, commissioning. Any errors are unintentional.  Halina Maidens, MD [facility]  03/28/2019

## 2019-03-29 LAB — LIPID PANEL
Chol/HDL Ratio: 3.8 ratio (ref 0.0–4.4)
Cholesterol, Total: 226 mg/dL — ABNORMAL HIGH (ref 100–199)
HDL: 59 mg/dL (ref >39)
LDL Calculated: 126 mg/dL — ABNORMAL HIGH (ref 0–99)
Triglycerides: 205 mg/dL — ABNORMAL HIGH (ref 0–149)
VLDL Cholesterol Cal: 41 mg/dL — ABNORMAL HIGH (ref 5–40)

## 2019-03-29 LAB — TSH: TSH: 3.77 u[IU]/mL (ref 0.450–4.500)

## 2019-03-29 LAB — COMPREHENSIVE METABOLIC PANEL
ALT: 11 IU/L (ref 0–32)
AST: 17 IU/L (ref 0–40)
Albumin/Globulin Ratio: 1.7 (ref 1.2–2.2)
Albumin: 4.4 g/dL (ref 3.7–4.7)
Alkaline Phosphatase: 59 IU/L (ref 39–117)
BUN/Creatinine Ratio: 13 (ref 12–28)
BUN: 10 mg/dL (ref 8–27)
Bilirubin Total: 0.5 mg/dL (ref 0.0–1.2)
CO2: 23 mmol/L (ref 20–29)
Calcium: 9.6 mg/dL (ref 8.7–10.3)
Chloride: 99 mmol/L (ref 96–106)
Creatinine, Ser: 0.75 mg/dL (ref 0.57–1.00)
GFR calc Af Amer: 89 mL/min/{1.73_m2} (ref >59)
GFR calc non Af Amer: 77 mL/min/{1.73_m2} (ref >59)
Globulin, Total: 2.6 g/dL (ref 1.5–4.5)
Glucose: 91 mg/dL (ref 65–99)
Potassium: 4.3 mmol/L (ref 3.5–5.2)
Sodium: 139 mmol/L (ref 134–144)
Total Protein: 7 g/dL (ref 6.0–8.5)

## 2019-03-29 LAB — CBC WITH DIFFERENTIAL/PLATELET
Basophils Absolute: 0.1 10*3/uL (ref 0.0–0.2)
Basos: 1 %
EOS (ABSOLUTE): 0.2 10*3/uL (ref 0.0–0.4)
Eos: 2 %
Hematocrit: 39.5 % (ref 34.0–46.6)
Hemoglobin: 13.4 g/dL (ref 11.1–15.9)
Immature Grans (Abs): 0 10*3/uL (ref 0.0–0.1)
Immature Granulocytes: 0 %
Lymphocytes Absolute: 4.1 10*3/uL — ABNORMAL HIGH (ref 0.7–3.1)
Lymphs: 38 %
MCH: 28.4 pg (ref 26.6–33.0)
MCHC: 33.9 g/dL (ref 31.5–35.7)
MCV: 84 fL (ref 79–97)
Monocytes Absolute: 0.9 10*3/uL (ref 0.1–0.9)
Monocytes: 9 %
Neutrophils Absolute: 5.5 10*3/uL (ref 1.4–7.0)
Neutrophils: 50 %
Platelets: 317 10*3/uL (ref 150–450)
RBC: 4.72 x10E6/uL (ref 3.77–5.28)
RDW: 12.1 % (ref 11.7–15.4)
WBC: 10.9 10*3/uL — ABNORMAL HIGH (ref 3.4–10.8)

## 2019-03-29 LAB — VITAMIN D 25 HYDROXY (VIT D DEFICIENCY, FRACTURES): Vit D, 25-Hydroxy: 13.3 ng/mL — ABNORMAL LOW (ref 30.0–100.0)

## 2019-04-03 ENCOUNTER — Other Ambulatory Visit: Payer: Self-pay

## 2019-04-03 ENCOUNTER — Telehealth: Payer: Self-pay | Admitting: Internal Medicine

## 2019-04-03 ENCOUNTER — Ambulatory Visit (INDEPENDENT_AMBULATORY_CARE_PROVIDER_SITE_OTHER): Payer: Medicare HMO

## 2019-04-03 VITALS — BP 138/76 | HR 83 | Temp 97.4°F | Resp 16 | Ht 62.0 in | Wt 181.2 lb

## 2019-04-03 DIAGNOSIS — M6530 Trigger finger, unspecified finger: Secondary | ICD-10-CM | POA: Insufficient documentation

## 2019-04-03 DIAGNOSIS — Z0000 Encounter for general adult medical examination without abnormal findings: Secondary | ICD-10-CM | POA: Diagnosis not present

## 2019-04-03 DIAGNOSIS — R200 Anesthesia of skin: Secondary | ICD-10-CM | POA: Diagnosis not present

## 2019-04-03 DIAGNOSIS — M65311 Trigger thumb, right thumb: Secondary | ICD-10-CM | POA: Diagnosis not present

## 2019-04-03 NOTE — Telephone Encounter (Signed)
Called and left detailed message regarding what Dr. Army Melia said. See above msg.

## 2019-04-03 NOTE — Progress Notes (Signed)
Subjective:   Monica Campbell is a 78 y.o. female who presents for Medicare Annual (Subsequent) preventive examination.  Review of Systems:   Cardiac Risk Factors include: advanced age (>95men, >57 women);dyslipidemia;obesity (BMI >30kg/m2)     Objective:     Vitals: BP 138/76 (BP Location: Right Arm, Patient Position: Sitting, Cuff Size: Normal)    Pulse 83    Temp (!) 97.4 F (36.3 C) (Oral)    Resp 16    Ht 5\' 2"  (1.575 m)    Wt 181 lb 3.2 oz (82.2 kg)    SpO2 95%    BMI 33.14 kg/m   Body mass index is 33.14 kg/m.  Advanced Directives 04/03/2019 05/03/2018 03/23/2018 02/02/2018 07/05/2017 03/15/2017 03/15/2017  Does Patient Have a Medical Advance Directive? Yes No Yes No Yes Yes Yes  Type of Paramedic of [place];Living will - [place];Living will - - Special educational needs teacher of [place];Living will  Does patient want to make changes to medical advance directive? No - Patient declined - - - - - -  Copy of [place] in Chart? No - copy requested - No - copy requested - - - No - copy requested  Would patient like information on creating a medical advance directive? - No - Patient declined - No - Patient declined - - -    Tobacco Social History   Tobacco Use  Smoking Status Former Smoker   Packs/day: 0.50   Years: 30.00   Pack years: 15.00   Types: Cigarettes   Quit date: 09/07/2000   Years since quitting: 18.5  Smokeless Tobacco Never Used  Tobacco Comment   smoking cessation materials not required     Counseling given: Not Answered Comment: smoking cessation materials not required   Clinical Intake:  Pre-visit preparation completed: Yes  Pain : 0-10 Pain Score: 8  Pain Type: Chronic pain Pain Location: Hand Pain Orientation: Right Pain Descriptors / Indicators: Aching, Numbness, Tingling Pain Onset: More than a month ago Pain Frequency: Constant     Nutritional Status: BMI  > 30  Obese Nutritional Risks: None Diabetes: No  How often do you need to have someone help you when you read instructions, pamphlets, or other written materials from your doctor or pharmacy?: 1 - Never     Information entered by :: Clemetine Marker LPN  Past Medical History:  Diagnosis Date   COPD (chronic obstructive pulmonary disease) ([place])    Dyspnea    GERD (gastroesophageal reflux disease)    Hard of hearing    Hypercholesteremia    Osteoarthritis    Seasonal allergies    Past Surgical History:  Procedure Laterality Date   CATARACT EXTRACTION W/PHACO Left 02/02/2018   Procedure: CATARACT EXTRACTION PHACO AND INTRAOCULAR LENS PLACEMENT ([place]) LEFT  MALYUGIN;  Surgeon: Leandrew Koyanagi, MD;  Location: [facility];  Service: Ophthalmology;  Laterality: Left;   CATARACT EXTRACTION W/PHACO Right 05/03/2018   Procedure: CATARACT EXTRACTION PHACO AND INTRAOCULAR LENS PLACEMENT ([place]) RIGHT  IVA/TOPICAL;  Surgeon: Leandrew Koyanagi, MD;  Location: [facility];  Service: Ophthalmology;  Laterality: Right;   MINOR HEMORRHOIDECTOMY     Family History  Problem Relation Age of Onset   Heart disease Mother    Heart disease Sister    Heart disease Brother    Cancer Father        colon   Breast cancer Neg Hx    Social History   Socioeconomic History  Marital status: Widowed    Spouse name: Not on file   Number of children: 3   Years of education: Not on file   Highest education level: 12th grade  Occupational History   Occupation: Retired  Scientist, product/process development strain: Not hard at [employer] insecurity    Worry: Never true    Inability: Never true   Transportation needs    Medical: No    Non-medical: No  Tobacco Use   Smoking status: Former Smoker    Packs/day: 0.50    Years: 30.00    Pack years: 15.00    Types: Cigarettes    Quit date: 09/07/2000    Years since quitting: 18.5   Smokeless tobacco: Never Used    Tobacco comment: smoking cessation materials not required  Substance and Sexual Activity   Alcohol use: Yes    Alcohol/week: 1.0 standard drinks    Types: 1 Glasses of wine per week    Comment: social   Drug use: No   Sexual activity: Not Currently  Lifestyle   Physical activity    Days per week: 7 days    Minutes per session: 20 min   Stress: Rather much  Relationships   Social connections    Talks on phone: More than three times a week    Gets together: Twice a week    Attends religious service: Never    Active member of club or organization: No    Attends meetings of clubs or organizations: Never    Relationship status: Widowed  Other Topics Concern   Not on file  Social History Narrative   Not on file    Outpatient Encounter Medications as of 04/03/2019  Medication Sig   albuterol (PROVENTIL) (2.5 MG/3ML) 0.083% nebulizer solution Take 3 mLs by nebulization every 6 (six) hours as needed for wheezing or shortness of breath.   albuterol (VENTOLIN HFA) 108 (90 Base) MCG/ACT inhaler Inhale 2 puffs into the lungs every 4 (four) hours as needed for wheezing or shortness of breath.   aspirin 81 MG chewable tablet Chew 1 tablet by mouth daily.   Calcium Carbonate-Vit D-Min (CALTRATE 600+D PLUS MINERALS) 600-800 MG-UNIT CHEW Chew 1 tablet by mouth daily.   cetirizine (ZYRTEC) 10 MG tablet Take 10 mg by mouth daily.   Cholecalciferol (VITAMIN D) 125 MCG (5000 UT) CAPS Take 1 capsule by mouth daily.   docusate sodium (COLACE) 100 MG capsule Take 1 capsule by mouth daily.   famotidine (PEPCID) 20 MG tablet Take 20 mg by mouth daily.   fluticasone (FLONASE) 50 MCG/ACT nasal spray Place 2 sprays into both nostrils daily.   Fluticasone-Salmeterol (ADVAIR) 250-50 MCG/DOSE AEPB Inhale 1 puff into the lungs 2 (two) times daily.   simvastatin (ZOCOR) 10 MG tablet Take 1 tablet (10 mg total) by mouth at bedtime. (Patient not taking: Reported on 04/03/2019)   vitamin B-12  (CYANOCOBALAMIN) 250 MCG tablet Take by mouth.   [DISCONTINUED] albuterol (PROVENTIL HFA) 108 (90 Base) MCG/ACT inhaler Inhale 2 puffs into the lungs every 6 (six) hours as needed for wheezing or shortness of breath.   [DISCONTINUED] clobetasol cream (TEMOVATE) 7.03 % Apply 1 application topically as needed.   [DISCONTINUED] nystatin (MYCOSTATIN) 100000 UNIT/ML suspension Take 5 mLs by mouth 4 (four) times daily as needed.   No facility-administered encounter medications on file as of 04/03/2019.     Activities of Daily Living In your present state of health, do you have any difficulty performing  the following activities: 04/03/2019 05/03/2018  Hearing? Y N  Comment wears hearing aids -  Vision? N N  Comment wears glasses -  Difficulty concentrating or making decisions? N N  Walking or climbing stairs? N N  Dressing or bathing? N N  Doing errands, shopping? N -  Preparing Food and eating ? N -  Using the Toilet? N -  In the past six months, have you accidently leaked urine? Y -  Do you have problems with loss of bowel control? N -  Managing your Medications? N -  Managing your Finances? N -  Housekeeping or managing your Housekeeping? N -  Some recent data might be hidden    Patient Care Team: Glean Hess, MD as PCP - General (Internal Medicine) Erby Pian, MD as Referring Physician (Pulmonary Disease) Leandrew Koyanagi, MD as Referring Physician (Ophthalmology) Dr. Renee Pain (Orthopedic Surgery)    Assessment:   This is a routine wellness examination for [place].  Exercise Activities and Dietary recommendations Current Exercise Habits: Home exercise routine, Type of exercise: walking(exercise bike, stair stepper), Time (Minutes): 20, Frequency (Times/Week): 7, Weekly Exercise (Minutes/Week): 140, Intensity: Mild, Exercise limited by: respiratory conditions(s)  Goals     DIET - INCREASE WATER INTAKE     Recommend to drink at least 6-8 8oz glasses of water per  day.     Weight < 200 lb (90.719 kg)     Patient has lost 10-15 pounds being very active and would like to continue being active in her life as well as keep eating healthy.        Fall Risk Fall Risk  04/03/2019 03/28/2019 01/20/2019 11/01/2018 03/23/2018  Falls in the past year? 0 1 0 0 Yes  Comment - - - - fell when exercising  Number falls in past yr: 0 0 0 0 1  Injury with Fall? 0 0 0 0 Yes  Comment - - - - sprain  Risk Factor Category  - - - - High Fall Risk  Comment - - - - foot pain  Risk for fall due to : - History of fall(s);Impaired balance/gait - History of fall(s) Impaired vision;Impaired balance/gait;Other (Comment)  Risk for fall due to: Comment - - - - wears eyeglasses; needs R cataract removal; foot pain; pulmonary emphysema  Follow up Falls prevention discussed Falls evaluation completed Falls evaluation completed Falls evaluation completed Falls evaluation completed;Education provided;Falls prevention discussed   FALL RISK PREVENTION PERTAINING TO THE HOME:  Any stairs in or around the home? Yes  If so, do they handrails? Yes   Home free of loose throw rugs in walkways, pet beds, electrical cords, etc? Yes  Adequate lighting in your home to reduce risk of falls? Yes   ASSISTIVE DEVICES UTILIZED TO PREVENT FALLS:  Life alert? No  Use of a cane, walker or w/c? No  Grab bars in the bathroom? Yes  Shower chair or bench in shower? No  Elevated toilet seat or a handicapped toilet? No   DME ORDERS:  DME order needed?  No   TIMED UP AND GO:  Was the test performed? Yes .  Length of time to ambulate 10 feet: 6 sec.   GAIT:  Appearance of gait: Gait stead-fast and without the use of an assistive device.   Education: Fall risk prevention has been discussed.  Intervention(s) required? No   Depression Screen PHQ 2/9 Scores 04/03/2019 03/28/2019 01/20/2019 11/01/2018  PHQ - 2 Score 1 2 0 0  PHQ- 9 Score  5 6 - -     Cognitive Function     6CIT Screen 04/03/2019  03/23/2018 03/15/2017 03/15/2017  What Year? 0 points 0 points 0 points 0 points  What month? 0 points 0 points 0 points 0 points  What time? 0 points 0 points 0 points -  Count back from 20 0 points 0 points 0 points -  Months in reverse 0 points 2 points 0 points -  Repeat phrase 0 points 2 points 2 points -  Total Score 0 4 2 -    Immunization History  Administered Date(s) Administered   Influenza, High Dose Seasonal PF 06/09/2017, 05/12/2018   Influenza-Unspecified 05/18/2012, 07/24/2015   Pneumococcal Conjugate-13 01/17/2014   Pneumococcal Polysaccharide-23 02/06/2004, 02/20/2007, 12/16/2011   Tdap 05/11/2014   Zoster 02/04/2010     Qualifies for Shingles Vaccine? Yes  Zostavax completed 2011. Due for Shingrix. Education has been provided regarding the importance of this vaccine. Pt has been advised to call insurance company to determine out of pocket expense. Advised may also receive vaccine at local pharmacy or Health Dept. Verbalized acceptance and understanding.  Tdap: Up to date  Flu Vaccine: Up to date  Pneumococcal Vaccine: Up to date   Screening Tests Health Maintenance  Topic Date Due   INFLUENZA VACCINE  04/08/2019   MAMMOGRAM  04/15/2019   TETANUS/TDAP  05/11/2024   PNA vac Low Risk Adult  Completed   DEXA SCAN  Addressed    Cancer Screenings:  Colorectal Screening: Completed 07/20/12. Repeat every 5 years; Plans to discuss with Dr. Army Melia if repeat needed at this time due to hyperplastic polyps.   Mammogram: Completed 04/14/18. Repeat every year; scheduled for 04/18/19  Bone Density: Completed 04/14/18. Results reflect OSTEOPOROSIS. Repeat every 2 years.   Lung Cancer Screening: (Low Dose CT Chest recommended if Age 43-80 years, 30 pack-year currently smoking OR have quit w/in 15years.) does not qualify.   Additional Screening:  Hepatitis C Screening: no longer required  Vision Screening: Recommended annual ophthalmology exams for early  detection of glaucoma and other disorders of the eye. Is the patient up to date with their annual eye exam?  Yes  Who is the provider or what is the name of the office in which the pt attends annual eye exams? [facility]  Dental Screening: Recommended annual dental exams for proper oral hygiene  Community Resource Referral:  CRR required this visit?  No       Plan:      I have personally reviewed and addressed the Medicare Annual Wellness questionnaire and have noted the following in the patients chart:  A. Medical and social history B. Use of alcohol, tobacco or illicit drugs  C. Current medications and supplements D. Functional ability and status E.  Nutritional status F.  Physical activity G. Advance directives H. List of other physicians I.  Hospitalizations, surgeries, and ER visits in previous 12 months J.  [place] such as hearing and vision if needed, cognitive and depression L. Referrals and appointments   In addition, I have reviewed and discussed with patient certain preventive protocols, quality metrics, and best practice recommendations. A written personalized care plan for preventive services as well as general preventive health recommendations were provided to patient.   Signed,  Clemetine Marker, LPN Nurse Health Advisor   Nurse Notes: pt had physical last week. States she has not been taking simvastatin but plans to restart due to slightly elevated LDL. She also wants to know if  she needs to take Vitamin B12 or co-enzyme Q10. Advised I would have Dr. Army Melia notify her if she needed it. Per recent lab work pt had low vitamin D and was advised to take Vit D 2000 IU but she states she started taking Vit D 5000 IU and wants to make sure that is okay. Pt c/o pain in right hand and has appt this afternoon with Dr. Nelva Bush at [facility] for her hand.

## 2019-04-03 NOTE — Telephone Encounter (Signed)
Pt had MAW today.  She asks if she needs to take B12 or Co Q-10.  I do not routinely recommend those supplements as long as she is eating a healthy, balanced diet. Her vitamin D was very low and I recommended 2000 IU daily.  She got 5000 IU and wants to be sure this is okay.  She can take 5000 IU daily - no problem.

## 2019-04-03 NOTE — Patient Instructions (Addendum)
[name] , Thank you for taking time to come for your Medicare Wellness Visit. I appreciate your ongoing commitment to your health goals. Please review the following plan we discussed and let me know if I can assist you in the future.   Screening recommendations/referrals: Colonoscopy: done 07/20/12 Mammogram: done 04/14/18. Scheduled for 04/18/19. Bone Density: done 04/14/18 Recommended yearly ophthalmology/optometry visit for glaucoma screening and checkup Recommended yearly dental visit for hygiene and checkup  Vaccinations: Influenza vaccine: done 05/12/18 Pneumococcal vaccine: done 01/17/14 Tdap vaccine: done 05/17/14 Shingles vaccine: Shingrix discussed. Please contact your pharmacy for coverage information.   Advanced directives: Please bring a copy of your health care power of attorney and living will to the office at your convenience.  Conditions/risks identified: Recommend drinking 6-8 glasses of water per day   Next appointment: Please follow up in one year for your Medicare Annual Wellness visit.     Preventive Care 6 Years and Older, Female Preventive care refers to lifestyle choices and visits with your health care provider that can promote health and wellness. What does preventive care include?  A yearly physical exam. This is also called an annual well check.  Dental exams once or twice a year.  Routine eye exams. Ask your health care provider how often you should have your eyes checked.  Personal lifestyle choices, including:  Daily care of your teeth and gums.  Regular physical activity.  Eating a healthy diet.  Avoiding tobacco and drug use.  Limiting alcohol use.  Practicing safe sex.  Taking low-dose aspirin every day.  Taking vitamin and mineral supplements as recommended by your health care provider. What happens during an annual well check? The services and screenings done by your health care provider during your annual well check will depend on  your age, overall health, lifestyle risk factors, and family history of disease. Counseling  Your health care provider may ask you questions about your:  Alcohol use.  Tobacco use.  Drug use.  Emotional well-being.  Home and relationship well-being.  Sexual activity.  Eating habits.  History of falls.  Memory and ability to understand (cognition).  Work and work Statistician.  Reproductive health. Screening  You may have the following tests or measurements:  Height, weight, and BMI.  Blood pressure.  Lipid and cholesterol levels. These may be checked every 5 years, or more frequently if you are over 71 years old.  Skin check.  Lung cancer screening. You may have this screening every year starting at age 25 if you have a 30-pack-year history of smoking and currently smoke or have quit within the past 15 years.  Fecal occult blood test (FOBT) of the stool. You may have this test every year starting at age 60.  Flexible sigmoidoscopy or colonoscopy. You may have a sigmoidoscopy every 5 years or a colonoscopy every 10 years starting at age 77.  Hepatitis C blood test.  Hepatitis B blood test.  Sexually transmitted disease (STD) testing.  Diabetes screening. This is done by checking your blood sugar (glucose) after you have not eaten for a while (fasting). You may have this done every 1-3 years.  Bone density scan. This is done to screen for osteoporosis. You may have this done starting at age 46.  Mammogram. This may be done every 1-2 years. Talk to your health care provider about how often you should have regular mammograms. Talk with your health care provider about your test results, treatment options, and if necessary, the need for more  tests. Vaccines  Your health care provider may recommend certain vaccines, such as:  Influenza vaccine. This is recommended every year.  Tetanus, diphtheria, and acellular pertussis (Tdap, Td) vaccine. You may need a Td booster  every 10 years.  Zoster vaccine. You may need this after age 18.  Pneumococcal 13-valent conjugate (PCV13) vaccine. One dose is recommended after age 1.  Pneumococcal polysaccharide (PPSV23) vaccine. One dose is recommended after age 14. Talk to your health care provider about which screenings and vaccines you need and how often you need them. This information is not intended to replace advice given to you by your health care provider. Make sure you discuss any questions you have with your health care provider. Document Released: 09/20/2015 Document Revised: 05/13/2016 Document Reviewed: 06/25/2015 Elsevier Interactive Patient Education  2017 [place] Prevention in the Home Falls can cause injuries. They can happen to people of all ages. There are many things you can do to make your home safe and to help prevent falls. What can I do on the outside of my home?  Regularly fix the edges of walkways and driveways and fix any cracks.  Remove anything that might make you trip as you walk through a door, such as a raised step or threshold.  Trim any bushes or trees on the path to your home.  Use bright outdoor lighting.  Clear any walking paths of anything that might make someone trip, such as rocks or tools.  Regularly check to see if handrails are loose or broken. Make sure that both sides of any steps have handrails.  Any raised decks and porches should have guardrails on the edges.  Have any leaves, snow, or ice cleared regularly.  Use sand or salt on walking paths during winter.  Clean up any spills in your garage right away. This includes oil or grease spills. What can I do in the bathroom?  Use night lights.  Install grab bars by the toilet and in the tub and shower. Do not use towel bars as grab bars.  Use non-skid mats or decals in the tub or shower.  If you need to sit down in the shower, use a plastic, non-slip stool.  Keep the floor dry. Clean up any  water that spills on the floor as soon as it happens.  Remove soap buildup in the tub or shower regularly.  Attach bath mats securely with double-sided non-slip rug tape.  Do not have throw rugs and other things on the floor that can make you trip. What can I do in the bedroom?  Use night lights.  Make sure that you have a light by your bed that is easy to reach.  Do not use any sheets or blankets that are too big for your bed. They should not hang down onto the floor.  Have a firm chair that has side arms. You can use this for support while you get dressed.  Do not have throw rugs and other things on the floor that can make you trip. What can I do in the kitchen?  Clean up any spills right away.  Avoid walking on wet floors.  Keep items that you use a lot in easy-to-reach places.  If you need to reach something above you, use a strong step stool that has a grab bar.  Keep electrical cords out of the way.  Do not use floor polish or wax that makes floors slippery. If you must use wax, use non-skid floor  wax.  Do not have throw rugs and other things on the floor that can make you trip. What can I do with my stairs?  Do not leave any items on the stairs.  Make sure that there are handrails on both sides of the stairs and use them. Fix handrails that are broken or loose. Make sure that handrails are as long as the stairways.  Check any carpeting to make sure that it is firmly attached to the stairs. Fix any carpet that is loose or worn.  Avoid having throw rugs at the top or bottom of the stairs. If you do have throw rugs, attach them to the floor with carpet tape.  Make sure that you have a light switch at the top of the stairs and the bottom of the stairs. If you do not have them, ask someone to add them for you. What else can I do to help prevent falls?  Wear shoes that:  Do not have high heels.  Have rubber bottoms.  Are comfortable and fit you well.  Are closed  at the toe. Do not wear sandals.  If you use a stepladder:  Make sure that it is fully opened. Do not climb a closed stepladder.  Make sure that both sides of the stepladder are locked into place.  Ask someone to hold it for you, if possible.  Clearly mark and make sure that you can see:  Any grab bars or handrails.  First and last steps.  Where the edge of each step is.  Use tools that help you move around (mobility aids) if they are needed. These include:  Canes.  Walkers.  Scooters.  Crutches.  Turn on the lights when you go into a dark area. Replace any light bulbs as soon as they burn out.  Set up your furniture so you have a clear path. Avoid moving your furniture around.  If any of your floors are uneven, fix them.  If there are any pets around you, be aware of where they are.  Review your medicines with your doctor. Some medicines can make you feel dizzy. This can increase your chance of falling. Ask your doctor what other things that you can do to help prevent falls. This information is not intended to replace advice given to you by your health care provider. Make sure you discuss any questions you have with your health care provider. Document Released: 06/20/2009 Document Revised: 01/30/2016 Document Reviewed: 09/28/2014 Elsevier Interactive Patient Education  2017 [employer].

## 2019-04-05 ENCOUNTER — Telehealth: Payer: Self-pay

## 2019-04-05 NOTE — Telephone Encounter (Signed)
Patient called discussing symptoms of hand numbness and pain. Aching in legs and hands.  Tuesday night after her appointment with [place] she said all of this started.   Seen orthopedic doctor- thinks she has arthritis but not sure. Was given Meloxicam and gabapentin by orthopedic doctor. She said its not working and he told her to quit taking both of them. EMG is being ordered.  Told her to follow her orthopedic doctors instruction and go from there.  She verbalize understanding.

## 2019-04-06 DIAGNOSIS — M180 Bilateral primary osteoarthritis of first carpometacarpal joints: Secondary | ICD-10-CM | POA: Diagnosis not present

## 2019-04-06 DIAGNOSIS — K219 Gastro-esophageal reflux disease without esophagitis: Secondary | ICD-10-CM | POA: Diagnosis not present

## 2019-04-06 DIAGNOSIS — Z87891 Personal history of nicotine dependence: Secondary | ICD-10-CM | POA: Diagnosis not present

## 2019-04-06 DIAGNOSIS — R0600 Dyspnea, unspecified: Secondary | ICD-10-CM | POA: Diagnosis not present

## 2019-04-06 DIAGNOSIS — E785 Hyperlipidemia, unspecified: Secondary | ICD-10-CM | POA: Diagnosis not present

## 2019-04-06 DIAGNOSIS — J449 Chronic obstructive pulmonary disease, unspecified: Secondary | ICD-10-CM | POA: Diagnosis not present

## 2019-04-06 DIAGNOSIS — M79642 Pain in left hand: Secondary | ICD-10-CM | POA: Diagnosis not present

## 2019-04-06 DIAGNOSIS — R9431 Abnormal electrocardiogram [ECG] [EKG]: Secondary | ICD-10-CM | POA: Diagnosis not present

## 2019-04-06 DIAGNOSIS — M7989 Other specified soft tissue disorders: Secondary | ICD-10-CM | POA: Diagnosis not present

## 2019-04-06 DIAGNOSIS — R600 Localized edema: Secondary | ICD-10-CM | POA: Diagnosis not present

## 2019-04-06 DIAGNOSIS — Z791 Long term (current) use of non-steroidal anti-inflammatories (NSAID): Secondary | ICD-10-CM | POA: Diagnosis not present

## 2019-04-06 DIAGNOSIS — M79641 Pain in right hand: Secondary | ICD-10-CM | POA: Diagnosis not present

## 2019-04-06 DIAGNOSIS — M25541 Pain in joints of right hand: Secondary | ICD-10-CM | POA: Diagnosis not present

## 2019-04-06 DIAGNOSIS — M25542 Pain in joints of left hand: Secondary | ICD-10-CM | POA: Diagnosis not present

## 2019-04-06 DIAGNOSIS — R5383 Other fatigue: Secondary | ICD-10-CM | POA: Diagnosis not present

## 2019-04-07 ENCOUNTER — Telehealth: Payer: Self-pay

## 2019-04-07 NOTE — Telephone Encounter (Signed)
Patient called saying her hands are hurting so bad that she is now not sleeping.   Told her Dr. Army Melia is on vacation until 08/10. Told her Dr. Nelva Bush at [facility] is treating this and she needs to call him to discuss this since he has gave her two shots in her hands before and they did not work. She said she wants pain medicine. Told pt we do not prescribe pain medication but to call [facility] and determine next steps before the weekend hits.  She verbalized understanding.

## 2019-04-10 DIAGNOSIS — G5603 Carpal tunnel syndrome, bilateral upper limbs: Secondary | ICD-10-CM | POA: Diagnosis not present

## 2019-04-10 DIAGNOSIS — G5600 Carpal tunnel syndrome, unspecified upper limb: Secondary | ICD-10-CM | POA: Insufficient documentation

## 2019-04-18 ENCOUNTER — Other Ambulatory Visit: Payer: Self-pay

## 2019-04-18 ENCOUNTER — Ambulatory Visit
Admission: RE | Admit: 2019-04-18 | Discharge: 2019-04-18 | Disposition: A | Discharge status: Home / Self Care | Payer: Medicare HMO | Source: Ambulatory Visit | Attending: Internal Medicine | Admitting: Internal Medicine

## 2019-04-18 DIAGNOSIS — Z1231 Encounter for screening mammogram for malignant neoplasm of breast: Secondary | ICD-10-CM

## 2019-04-20 DIAGNOSIS — G5603 Carpal tunnel syndrome, bilateral upper limbs: Secondary | ICD-10-CM | POA: Diagnosis not present

## 2019-04-24 DIAGNOSIS — G5603 Carpal tunnel syndrome, bilateral upper limbs: Secondary | ICD-10-CM | POA: Diagnosis not present

## 2019-05-11 DIAGNOSIS — M65311 Trigger thumb, right thumb: Secondary | ICD-10-CM | POA: Diagnosis not present

## 2019-05-18 ENCOUNTER — Telehealth: Payer: Self-pay

## 2019-05-18 ENCOUNTER — Telehealth: Payer: Self-pay | Admitting: Internal Medicine

## 2019-05-18 NOTE — Telephone Encounter (Signed)
Pt wants to be seen, no strength in arms and hands. Not feeling good lately.

## 2019-05-18 NOTE — Telephone Encounter (Signed)
Patient called saying she is having numbness in both hands. Hard to grip with her hands, and open things on her own.  Told patient she will need to contact her current doctors office for orthopedic surgery to see if they can evaluate this more.  She verbalized understanding and will call [facility] for an appt to discuss.

## 2019-05-18 NOTE — Telephone Encounter (Signed)
Spoke with Monica Campbell. Dr. Army Melia does not work this afternoon and will be on vacation until 05/29/19 after today. Spoke to Monica Campbell ( see telephone msg from today ) but she will contact [facility] since they are currently treating her for her carpel tunnel. Monica Campbell is having no chest pain, sob, or slurred speech.   Thanks.

## 2019-05-20 DIAGNOSIS — Z20828 Contact with and (suspected) exposure to other viral communicable diseases: Secondary | ICD-10-CM | POA: Diagnosis not present

## 2019-05-23 ENCOUNTER — Telehealth: Payer: Self-pay

## 2019-05-23 NOTE — Telephone Encounter (Signed)
Patient called stating her hand pain is getting worse. She wants a referral to a rheumatologist. She said she is also losing weight and feels like her body is shutting down. Her son then started yelling in the background "I have to get her out of bed every morning, she can't walk."   Informed the patient that we cannot place a referral without seeing her. Dr. Army Melia is currently on vacation but will be glad to see her when she gets back. She then seemed upset and said she needed to be seen "right now." Told pt if she needs immediate attention then its best she goes to the ER where urgent testing can be done. Otherwise, she has already been scheduled for this issue 06/07/2019 to see Dr. Army Melia and we can discuss these issues then.  She said she will have to go to the ER even though she does not want to, and she verbalized understanding.

## 2019-06-01 ENCOUNTER — Telehealth: Payer: Self-pay

## 2019-06-01 NOTE — Telephone Encounter (Signed)
Copied from [place] [id number]. Topic: General - Other >> Jun 01, 2019  4:28 PM Keene Breath wrote: Reason for CRM: Patient called to ask if Dr. Nicki Reaper would take her on as a new patient.  She has a friend who also sees Charlene as well.  Patient stated that she had seen her years ago.  Please call patient to let her know if she can become a new patient.  CB# [phone]

## 2019-06-05 NOTE — Telephone Encounter (Signed)
See me

## 2019-06-05 NOTE — Telephone Encounter (Signed)
Advised patient that Dr Nicki Reaper is not taking any new patients at this time. Gave info for Dr Aundra Dubin and Ander Purpura. Pt will call back

## 2019-06-07 ENCOUNTER — Other Ambulatory Visit: Payer: Self-pay

## 2019-06-07 ENCOUNTER — Ambulatory Visit (INDEPENDENT_AMBULATORY_CARE_PROVIDER_SITE_OTHER): Payer: Medicare HMO | Admitting: Internal Medicine

## 2019-06-07 ENCOUNTER — Encounter: Payer: Self-pay | Admitting: Internal Medicine

## 2019-06-07 VITALS — BP 136/78 | HR 81 | Ht 62.0 in | Wt 179.0 lb

## 2019-06-07 DIAGNOSIS — M79641 Pain in right hand: Secondary | ICD-10-CM

## 2019-06-07 DIAGNOSIS — M791 Myalgia, unspecified site: Secondary | ICD-10-CM | POA: Diagnosis not present

## 2019-06-07 DIAGNOSIS — R5383 Other fatigue: Secondary | ICD-10-CM

## 2019-06-07 DIAGNOSIS — M79642 Pain in left hand: Secondary | ICD-10-CM | POA: Diagnosis not present

## 2019-06-07 NOTE — Progress Notes (Signed)
Date:  06/07/2019   Name:  Monica Campbell   DOB:  Aug 20, 1941   MRN:  [id number]   Chief Complaint: Generalized Body Aches (Wants rheumatology labs to see if she can see a rhuematologist for her hand pain. Said her body hurts from her neck and down. Also has a knot in the palm of the left hands and unsure what it is . ) She states that about 6 weeks ago she was without any pain or stiffnes then the next morning woke up and could not move from diffuse pain and stiffness.  She finally got up and started moving around.  Since that time she has similar symptoms every day but not as severe.  She denies specific joint swelling, redness, warmth or injury. She does have tendonitis in her right thumb.  Ortho also thought she had CTS but NCS were normal. She has cramping in both hands with fingers drawing up at times.  She has trouble raising her arms over her head.  She has trouble getting up from a chair due to "stiffness" but once she is up she can ambulate fairly well. She has been given gabapentin, and mobic for hand pain with no benefit.  HPI  Review of Systems  Constitutional: Positive for fatigue. Negative for chills, fever and unexpected weight change.  Respiratory: Negative for cough, chest tightness, shortness of breath and wheezing.   Cardiovascular: Positive for leg swelling. Negative for chest pain and palpitations.  Gastrointestinal: Negative for abdominal pain and constipation.  Musculoskeletal: Positive for arthralgias, gait problem and myalgias. Negative for joint swelling.  Neurological: Negative for dizziness and headaches.    Patient Active Problem List   Diagnosis Date Noted  . Carpal tunnel syndrome 04/10/2019  . Acquired trigger finger 04/03/2019  . Numbness of hand 04/03/2019  . Foot pain, left 12/17/2017  . Elevated TSH 05/03/2017  . Osteoporosis 03/24/2016  . Chronic dermatitis 07/09/2015  . Allergic rhinitis, seasonal 12/12/2014  . Breast screening 12/12/2014  .  Gastro-esophageal reflux disease without esophagitis 12/12/2014  . Mixed hyperlipidemia 12/12/2014  . Degenerative arthritis of hip 12/12/2014  . Chronic obstructive pulmonary disease, unspecified ([place]) 10/11/2014  . Bilateral hearing loss 07/15/2014    Allergies  Allergen Reactions  . Augmentin [Amoxicillin-Pot Clavulanate] Itching    Itching all over.  . Codeine Rash    Past Surgical History:  Procedure Laterality Date  . CATARACT EXTRACTION W/PHACO Left 02/02/2018   Procedure: CATARACT EXTRACTION PHACO AND INTRAOCULAR LENS PLACEMENT ([place]) LEFT  MALYUGIN;  Surgeon: Leandrew Koyanagi, MD;  Location: [facility];  Service: Ophthalmology;  Laterality: Left;  . CATARACT EXTRACTION W/PHACO Right 05/03/2018   Procedure: CATARACT EXTRACTION PHACO AND INTRAOCULAR LENS PLACEMENT ([place]) RIGHT  IVA/TOPICAL;  Surgeon: Leandrew Koyanagi, MD;  Location: [facility];  Service: Ophthalmology;  Laterality: Right;  . MINOR HEMORRHOIDECTOMY      Social History   Tobacco Use  . Smoking status: Former Smoker    Packs/day: 0.50    Years: 30.00    Pack years: 15.00    Types: Cigarettes    Quit date: 09/07/2000    Years since quitting: 18.7  . Smokeless tobacco: Never Used  . Tobacco comment: smoking cessation materials not required  Substance Use Topics  . Alcohol use: Yes    Alcohol/week: 1.0 standard drinks    Types: 1 Glasses of wine per week    Comment: social  . Drug use: No     Medication list has been reviewed  and updated.  Current Meds  Medication Sig  . acetaminophen (TYLENOL) 650 MG CR tablet Take 650 mg by mouth every 8 (eight) hours as needed for pain.  Marland Kitchen albuterol (PROVENTIL) (2.5 MG/3ML) 0.083% nebulizer solution Take 3 mLs by nebulization every 6 (six) hours as needed for wheezing or shortness of breath.  Marland Kitchen albuterol (VENTOLIN HFA) 108 (90 Base) MCG/ACT inhaler Inhale 2 puffs into the lungs every 4 (four) hours as needed for wheezing or shortness of  breath.  Marland Kitchen aspirin 81 MG chewable tablet Chew 1 tablet by mouth daily.  . Calcium Carbonate-Vit D-Min (CALTRATE 600+D PLUS MINERALS) 600-800 MG-UNIT CHEW Chew 1 tablet by mouth daily.  . cetirizine (ZYRTEC) 10 MG tablet Take 10 mg by mouth daily.  . Cholecalciferol (VITAMIN D) 125 MCG (5000 UT) CAPS Take 1 capsule by mouth daily.  Marland Kitchen docusate sodium (COLACE) 100 MG capsule Take 1 capsule by mouth daily.  . famotidine (PEPCID) 20 MG tablet Take 20 mg by mouth daily.  . fluticasone (FLONASE) 50 MCG/ACT nasal spray Place 2 sprays into both nostrils daily.  . Fluticasone-Salmeterol (ADVAIR) 250-50 MCG/DOSE AEPB Inhale 1 puff into the lungs 2 (two) times daily.  . simvastatin (ZOCOR) 10 MG tablet Take 1 tablet (10 mg total) by mouth at bedtime.  . vitamin B-12 (CYANOCOBALAMIN) 250 MCG tablet Take by mouth.    PHQ 2/9 Scores 06/07/2019 04/03/2019 03/28/2019 01/20/2019  PHQ - 2 Score 6 1 2  0  PHQ- 9 Score 12 5 6  -    BP Readings from Last 3 Encounters:  06/07/19 136/78  04/03/19 138/76  03/28/19 118/68    Physical Exam Vitals signs and nursing note reviewed.  Constitutional:      General: She is not in acute distress.    Appearance: She is well-developed.  HENT:     Head: Normocephalic and atraumatic.  Neck:     Musculoskeletal: Normal range of motion.  Cardiovascular:     Rate and Rhythm: Normal rate and regular rhythm.  Pulmonary:     Effort: Pulmonary effort is normal. No respiratory distress.     Breath sounds: Normal breath sounds.  Musculoskeletal:     Right lower leg: No edema.     Left lower leg: No edema.  Lymphadenopathy:     Cervical: No cervical adenopathy.  Skin:    General: Skin is warm and dry.     Capillary Refill: Capillary refill takes less than 2 seconds.     Findings: No erythema, lesion, petechiae or rash.  Neurological:     Mental Status: She is alert and oriented to person, place, and time.     Sensory: Sensation is intact.     Comments: Unable to raise  arms over chest level. Difficulty rising from the chair but gait is intact with good balance. No focal muscle tenderness to palpation No joint deformity, swelling, warmth or erythema - hands, wrists, elbow, knees  Psychiatric:        Behavior: Behavior normal.        Thought Content: Thought content normal.     Wt Readings from Last 3 Encounters:  06/07/19 179 lb (81.2 kg)  04/03/19 181 lb 3.2 oz (82.2 kg)  03/28/19 182 lb (82.6 kg)    BP 136/78   Pulse 81   Ht 5\' 2"  (1.575 m)   Wt 179 lb (81.2 kg)   SpO2 94%   BMI 32.74 kg/m   Assessment and Plan: 1. Myalgia Suspect PMR or polymyositis - ANA w/Reflex  if Positive - Sedimentation rate - ANCA Titers ( [facility])  2. Fatigue, unspecified type - ANA w/Reflex if Positive - Sedimentation rate - ANCA [facility]ers ( [facility])  3. Bilateral hand pain S/p thumb surgery CTS supposedly ruled out - Rheumatoid factor - CYCLIC CITRUL PEPTIDE ANTIBODY, IGG/IGA   Partially dictated using Editor, commissioning. Any errors are unintentional.  Halina Maidens, MD [facility]  06/07/2019

## 2019-06-07 NOTE — Patient Instructions (Signed)
Tests are for several autoimmune conditions and for Polymyalgia rheumatica as well as rheumatoid arthritis.

## 2019-06-08 ENCOUNTER — Telehealth: Payer: Self-pay

## 2019-06-08 NOTE — Telephone Encounter (Signed)
-----   Message from Glean Hess, MD sent at 06/08/2019  7:58 AM EDT ----- Please call [facility] and add on CK and liver function tests (not hepatitis panel).  Dx is myalgia  M79.10.

## 2019-06-08 NOTE — Telephone Encounter (Signed)
Per Berglund's request I called and added to labs drawn on patient yesterday. Called [facility] and added Hepatic Function Panel, and Creatnine with Diagnosis code M79.10.

## 2019-06-09 LAB — ANCA TITERS
Atypical pANCA: <1:20 {titer}
C-ANCA: <1:20 {titer}
P-ANCA: <1:20 {titer}

## 2019-06-09 LAB — RHEUMATOID FACTOR: Rhuematoid fact SerPl-aCnc: <10 IU/mL (ref 0.0–13.9)

## 2019-06-09 LAB — ANA W/REFLEX IF POSITIVE: Anti Nuclear Antibody (ANA): NEGATIVE

## 2019-06-09 LAB — SEDIMENTATION RATE: Sed Rate: 17 mm/hr (ref 0–40)

## 2019-06-09 LAB — CYCLIC CITRUL PEPTIDE ANTIBODY, IGG/IGA: Cyclic Citrullin Peptide Ab: 6 units (ref 0–19)

## 2019-06-13 ENCOUNTER — Other Ambulatory Visit: Payer: Self-pay

## 2019-06-13 DIAGNOSIS — R52 Pain, unspecified: Secondary | ICD-10-CM

## 2019-06-13 DIAGNOSIS — R5383 Other fatigue: Secondary | ICD-10-CM

## 2019-06-13 DIAGNOSIS — M791 Myalgia, unspecified site: Secondary | ICD-10-CM

## 2019-06-13 NOTE — Progress Notes (Signed)
Patient informed of normal Creatnine and Hepatic Function Panel. Placed referral for Rheumatology Urgently per Dr. Army Melia. Told pt to wait on a call in the next few days to be scheduled. We will call her if there is any issues with the referral otherwise.

## 2019-06-21 DIAGNOSIS — R600 Localized edema: Secondary | ICD-10-CM | POA: Diagnosis not present

## 2019-06-21 DIAGNOSIS — E559 Vitamin D deficiency, unspecified: Secondary | ICD-10-CM | POA: Insufficient documentation

## 2019-06-21 DIAGNOSIS — M791 Myalgia, unspecified site: Secondary | ICD-10-CM | POA: Insufficient documentation

## 2019-06-21 DIAGNOSIS — I872 Venous insufficiency (chronic) (peripheral): Secondary | ICD-10-CM | POA: Insufficient documentation

## 2019-06-30 LAB — HEPATIC FUNCTION PANEL
ALT: 8 IU/L (ref 0–32)
AST: 14 IU/L (ref 0–40)
Albumin: 4.1 g/dL (ref 3.7–4.7)
Alkaline Phosphatase: 61 IU/L (ref 39–117)
Bilirubin Total: <0.2 mg/dL (ref 0.0–1.2)
Bilirubin, Direct: 0.06 mg/dL (ref 0.00–0.40)
Total Protein: 6.9 g/dL (ref 6.0–8.5)

## 2019-06-30 LAB — CREATININE, SERUM
Creatinine, Ser: 0.74 mg/dL (ref 0.57–1.00)
GFR calc Af Amer: 90 mL/min/{1.73_m2} (ref >59)
GFR calc non Af Amer: 78 mL/min/{1.73_m2} (ref >59)

## 2019-06-30 LAB — SPECIMEN STATUS REPORT

## 2019-07-05 DIAGNOSIS — M791 Myalgia, unspecified site: Secondary | ICD-10-CM | POA: Diagnosis not present

## 2019-07-05 DIAGNOSIS — M25511 Pain in right shoulder: Secondary | ICD-10-CM | POA: Diagnosis not present

## 2019-07-05 DIAGNOSIS — M19011 Primary osteoarthritis, right shoulder: Secondary | ICD-10-CM | POA: Diagnosis not present

## 2019-07-05 DIAGNOSIS — M19012 Primary osteoarthritis, left shoulder: Secondary | ICD-10-CM | POA: Diagnosis not present

## 2019-07-05 DIAGNOSIS — M7551 Bursitis of right shoulder: Secondary | ICD-10-CM | POA: Insufficient documentation

## 2019-07-05 DIAGNOSIS — M542 Cervicalgia: Secondary | ICD-10-CM | POA: Diagnosis not present

## 2019-07-05 DIAGNOSIS — M25512 Pain in left shoulder: Secondary | ICD-10-CM | POA: Diagnosis not present

## 2019-07-31 DIAGNOSIS — M25511 Pain in right shoulder: Secondary | ICD-10-CM | POA: Diagnosis not present

## 2019-07-31 DIAGNOSIS — M25611 Stiffness of right shoulder, not elsewhere classified: Secondary | ICD-10-CM | POA: Diagnosis not present

## 2019-07-31 DIAGNOSIS — M7551 Bursitis of right shoulder: Secondary | ICD-10-CM | POA: Diagnosis not present

## 2019-07-31 DIAGNOSIS — M6281 Muscle weakness (generalized): Secondary | ICD-10-CM | POA: Diagnosis not present

## 2019-09-05 DIAGNOSIS — M19011 Primary osteoarthritis, right shoulder: Secondary | ICD-10-CM | POA: Insufficient documentation

## 2019-09-05 DIAGNOSIS — M19012 Primary osteoarthritis, left shoulder: Secondary | ICD-10-CM | POA: Diagnosis not present

## 2019-11-10 ENCOUNTER — Other Ambulatory Visit: Payer: Self-pay

## 2019-11-10 MED ORDER — FLUCONAZOLE 150 MG PO TABS: 150.0000 mg | ORAL_TABLET | Freq: Once | ORAL | 0 refills | Status: AC

## 2020-01-24 ENCOUNTER — Telehealth: Payer: Self-pay | Admitting: Internal Medicine

## 2020-01-24 ENCOUNTER — Other Ambulatory Visit: Payer: Self-pay

## 2020-01-24 ENCOUNTER — Ambulatory Visit (INDEPENDENT_AMBULATORY_CARE_PROVIDER_SITE_OTHER): Payer: Medicare HMO | Admitting: Internal Medicine

## 2020-01-24 ENCOUNTER — Encounter: Payer: Self-pay | Admitting: Internal Medicine

## 2020-01-24 VITALS — BP 128/64 | HR 82 | Temp 97.8°F | Ht 62.0 in | Wt 178.0 lb

## 2020-01-24 DIAGNOSIS — E559 Vitamin D deficiency, unspecified: Secondary | ICD-10-CM | POA: Diagnosis not present

## 2020-01-24 DIAGNOSIS — M5442 Lumbago with sciatica, left side: Secondary | ICD-10-CM

## 2020-01-24 DIAGNOSIS — I872 Venous insufficiency (chronic) (peripheral): Secondary | ICD-10-CM | POA: Diagnosis not present

## 2020-01-24 DIAGNOSIS — M5441 Lumbago with sciatica, right side: Secondary | ICD-10-CM | POA: Diagnosis not present

## 2020-01-24 MED ORDER — GABAPENTIN 100 MG PO CAPS: 100.0000 mg | ORAL_CAPSULE | Freq: Every day | ORAL | 0 refills | Status: DC

## 2020-01-24 NOTE — Telephone Encounter (Unsigned)
Copied from [place] [id number] [id number]. Topic: General - Inquiry >> Jan 24, 2020 10:09 AM Scherrie Gerlach wrote: Reason for CRM:  pt wants a medication for her leg pain at night mostly, sit down for a while then get up. Pt DECLINED an appt, stating she just needs to talk with the dr's nurse. She said, "There's nothing the dr can test for, I am just having leg pain, and need to start a med asap.  My friend says she is taking gabapentin and that might work for me.  I think the nurse can help me with that" Advised pt I would >> Jan 24, 2020 11:00 AM Scherrie Gerlach wrote: Send message to Dr Carolin Coy nurse for cb.

## 2020-01-24 NOTE — Progress Notes (Signed)
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  Full serologic evaluation has been completed. Sed rate 30   Aldolase 4.6   LDH 166   CK 91 CCP 6  ANA negative  She has also been evaluated for shoulder bursitis and given a steroid injection.  She was also diagnosed with arthritis of both AC joints.  Her last visit with rheumatology was in December 2020.  She continues to have leg pain. She described sciatica of both sides with radiation down the lateral leg.  She has aching and weakness.  No loss of bowel or bladder control. She has known hip OA.  She has trouble walking long distances quickly and wonders about a handicapped placard.   She is requesting a trial of gabapentin which was recommended by her sister.  Gabapentin is on her allergy list but she does not recall taking it.  She has vitamin D def - last level was low in July.  She was instructed to take a daily supplement.  She also has chronic swelling of her right lower leg and foot.  It decreases overnight but recurs during the day.  It is uncomfortable but not painful.  She has not tried compression stockings.  She does have varicose veins of the right thigh.   Lab Results  Component Value Date   CREATININE 0.74 06/07/2019   BUN 10 03/28/2019   NA 139 03/28/2019   K 4.3 03/28/2019   CL 99 03/28/2019   CO2 23 03/28/2019   Lab Results  Component Value Date   CHOL 226 (H) 03/28/2019   HDL 59 03/28/2019   LDLCALC 126 (H) 03/28/2019   TRIG 205 (H) 03/28/2019   CHOLHDL 3.8 03/28/2019   Lab Results  Component Value Date   TSH 3.770 03/28/2019   No results found for: HGBA1C Lab Results  Component Value Date   WBC 10.9 (H) 03/28/2019   HGB 13.4 03/28/2019   HCT 39.5 03/28/2019   MCV 84 03/28/2019   PLT 317 03/28/2019   Lab Results  Component Value Date   ALT 8  06/07/2019   AST 14 06/07/2019   ALKPHOS 61 06/07/2019   BILITOT <0.2 06/07/2019     Review of Systems  Constitutional: Negative for chills, fatigue and fever.  Respiratory: Positive for shortness of breath (with exertion). Negative for chest tightness and wheezing.   Cardiovascular: Positive for leg swelling (right more than left; worse as the day progresses and improves overnight). Negative for chest pain.  Gastrointestinal: Negative for abdominal pain, constipation and diarrhea.  Musculoskeletal: Positive for arthralgias, back pain, gait problem and myalgias.  Neurological: Negative for dizziness and headaches.    Patient Active Problem List   Diagnosis Date Noted  . Arthritis of both acromioclavicular joints 09/05/2019  . Bursitis of right shoulder 07/05/2019  . Edema of both legs 06/21/2019  . Myalgia 06/21/2019  . Vitamin D deficiency 06/21/2019  . Carpal tunnel syndrome 04/10/2019  . Acquired trigger finger 04/03/2019  . Numbness of hand 04/03/2019  . Foot pain, left 12/17/2017  . Elevated TSH 05/03/2017  . Osteoporosis 03/24/2016  . Chronic dermatitis 07/09/2015  . Allergic rhinitis, seasonal 12/12/2014  . Breast screening 12/12/2014  . Gastro-esophageal reflux disease without esophagitis 12/12/2014  . Mixed hyperlipidemia 12/12/2014  . Degenerative arthritis of hip 12/12/2014  .  Chronic obstructive pulmonary disease, unspecified ([place]) 10/11/2014  . Bilateral hearing loss 07/15/2014    Allergies  Allergen Reactions  . Augmentin [Amoxicillin-Pot Clavulanate] Itching    Itching all over.  . Gabapentin Swelling  . Codeine Rash    Past Surgical History:  Procedure Laterality Date  . CATARACT EXTRACTION W/PHACO Left 02/02/2018   Procedure: CATARACT EXTRACTION PHACO AND INTRAOCULAR LENS PLACEMENT ([place]) LEFT  MALYUGIN;  Surgeon: Leandrew Koyanagi, MD;  Location: [facility];  Service: Ophthalmology;  Laterality: Left;  . CATARACT EXTRACTION W/PHACO Right  05/03/2018   Procedure: CATARACT EXTRACTION PHACO AND INTRAOCULAR LENS PLACEMENT ([place]) RIGHT  IVA/TOPICAL;  Surgeon: Leandrew Koyanagi, MD;  Location: [facility];  Service: Ophthalmology;  Laterality: Right;  . MINOR HEMORRHOIDECTOMY      Social History   Tobacco Use  . Smoking status: Former Smoker    Packs/day: 0.50    Years: 30.00    Pack years: 15.00    Types: Cigarettes    Quit date: 09/07/2000    Years since quitting: 19.3  . Smokeless tobacco: Never Used  . Tobacco comment: smoking cessation materials not required  Substance Use Topics  . Alcohol use: Yes    Alcohol/week: 1.0 standard drinks    Types: 1 Glasses of wine per week    Comment: social  . Drug use: No     Medication list has been reviewed and updated.  Current Meds  Medication Sig  . acetaminophen (TYLENOL) 650 MG CR tablet Take 650 mg by mouth every 8 (eight) hours as needed for pain.  Marland Kitchen albuterol (PROVENTIL) (2.5 MG/3ML) 0.083% nebulizer solution Take 3 mLs by nebulization every 6 (six) hours as needed for wheezing or shortness of breath.  Marland Kitchen albuterol (VENTOLIN HFA) 108 (90 Base) MCG/ACT inhaler Inhale 2 puffs into the lungs every 4 (four) hours as needed for wheezing or shortness of breath.  Marland Kitchen aspirin 81 MG chewable tablet Chew 1 tablet by mouth daily.  . Calcium Carbonate-Vit D-Min (CALTRATE 600+D PLUS MINERALS) 600-800 MG-UNIT CHEW Chew 1 tablet by mouth daily.  . cetirizine (ZYRTEC) 10 MG tablet Take 10 mg by mouth daily.  . Cholecalciferol (VITAMIN D) 125 MCG (5000 UT) CAPS Take 1 capsule by mouth daily.  Marland Kitchen docusate sodium (COLACE) 100 MG capsule Take 1 capsule by mouth daily.  . famotidine (PEPCID) 20 MG tablet Take 20 mg by mouth daily.  . fluticasone (FLONASE) 50 MCG/ACT nasal spray Place 2 sprays into both nostrils daily.  . Fluticasone-Salmeterol (ADVAIR) 250-50 MCG/DOSE AEPB Inhale 1 puff into the lungs 2 (two) times daily.  . simvastatin (ZOCOR) 10 MG tablet Take 1 tablet (10 mg total)  by mouth at bedtime.  . vitamin B-12 (CYANOCOBALAMIN) 250 MCG tablet Take by mouth.    PHQ 2/9 Scores 01/24/2020 06/07/2019 04/03/2019 03/28/2019  PHQ - 2 Score 0 6 1 2   PHQ- 9 Score 0 12 5 6     BP Readings from Last 3 Encounters:  01/24/20 128/64  06/07/19 136/78  04/03/19 138/76    Physical Exam Vitals and nursing note reviewed.  Constitutional:      General: She is not in acute distress.    Appearance: She is well-developed.  HENT:     Head: Normocephalic and atraumatic.  Cardiovascular:     Rate and Rhythm: Normal rate and regular rhythm.     Pulses: Normal pulses.     Heart sounds: No murmur.  Pulmonary:     Effort: Pulmonary effort is normal. No respiratory distress.  Breath sounds: No wheezing or rhonchi.  Musculoskeletal:     Lumbar back: Spasms and tenderness present. Negative right straight leg raise test and negative left straight leg raise test.     Right hip: No bony tenderness. Decreased range of motion.     Left hip: No bony tenderness. Decreased range of motion.     Right lower leg: Edema (trace ankle edema) present.     Left lower leg: No edema.  Lymphadenopathy:     Cervical: No cervical adenopathy.  Skin:    General: Skin is warm and dry.     Capillary Refill: Capillary refill takes less than 2 seconds.     Findings: No rash.  Neurological:     General: No focal deficit present.     Mental Status: She is alert and oriented to person, place, and time.     Gait: Gait abnormal (antalgic to the left).  Psychiatric:        Behavior: Behavior normal.        Thought Content: Thought content normal.     Wt Readings from Last 3 Encounters:  01/24/20 178 lb (80.7 kg)  06/07/19 179 lb (81.2 kg)  04/03/19 181 lb 3.2 oz (82.2 kg)    BP 128/64   Pulse 82   Temp 97.8 F (36.6 C) (Oral)   Ht 5\' 2"  (1.575 m)   Wt 178 lb (80.7 kg)   SpO2 94%   BMI 32.56 kg/m   Assessment and Plan: 1. Low back pain due to bilateral sciatica Symptoms consistent with  sciatica She is willing to try gabapentin again (her records indicate that she took it one time and had swelling in her feet - which by her admission was not new) Handicapped parking application given - gabapentin (NEURONTIN) 100 MG capsule; Take 1-3 capsules (100-300 mg total) by mouth at bedtime.  Dispense: 90 capsule; Refill: 0  2. Vitamin D deficiency Continue daily 5000 IU supplement - VITAMIN D 25 Hydroxy (Vit-D Deficiency, Fractures)  3. Venous insufficiency of right leg Recommend compression stockings - apply in the morning and remove at bedtime.  Do not sleep in them. Elevate legs when able.   Partially dictated using Editor, commissioning. Any errors are unintentional.  Halina Maidens, MD [facility]  01/24/2020   Partially dictated using Dragon software. Any errors are unintentional.  Halina Maidens, MD [facility]  01/24/2020

## 2020-01-24 NOTE — Patient Instructions (Signed)
Wear compression stockings during the day.   Do not sleep in them.

## 2020-01-24 NOTE — Telephone Encounter (Signed)
Spoke with pt. Informed she needs to be seen before we can prescribe any medication.  Scheduled at 3Pm today.  CM

## 2020-01-25 LAB — VITAMIN D 25 HYDROXY (VIT D DEFICIENCY, FRACTURES): Vit D, 25-Hydroxy: 26.3 ng/mL — ABNORMAL LOW (ref 30.0–100.0)

## 2020-01-30 DIAGNOSIS — M7551 Bursitis of right shoulder: Secondary | ICD-10-CM | POA: Diagnosis not present

## 2020-01-30 DIAGNOSIS — M5441 Lumbago with sciatica, right side: Secondary | ICD-10-CM | POA: Diagnosis not present

## 2020-01-30 DIAGNOSIS — M47816 Spondylosis without myelopathy or radiculopathy, lumbar region: Secondary | ICD-10-CM | POA: Diagnosis not present

## 2020-01-30 DIAGNOSIS — G8929 Other chronic pain: Secondary | ICD-10-CM | POA: Diagnosis not present

## 2020-01-31 ENCOUNTER — Ambulatory Visit: Payer: Self-pay | Admitting: Internal Medicine

## 2020-01-31 DIAGNOSIS — H903 Sensorineural hearing loss, bilateral: Secondary | ICD-10-CM | POA: Diagnosis not present

## 2020-02-20 ENCOUNTER — Other Ambulatory Visit: Payer: Self-pay | Admitting: Physical Medicine & Rehabilitation

## 2020-02-20 DIAGNOSIS — G8929 Other chronic pain: Secondary | ICD-10-CM | POA: Diagnosis not present

## 2020-02-20 DIAGNOSIS — M5441 Lumbago with sciatica, right side: Secondary | ICD-10-CM | POA: Diagnosis not present

## 2020-02-20 DIAGNOSIS — M5442 Lumbago with sciatica, left side: Secondary | ICD-10-CM | POA: Diagnosis not present

## 2020-02-20 DIAGNOSIS — M5416 Radiculopathy, lumbar region: Secondary | ICD-10-CM

## 2020-03-03 ENCOUNTER — Other Ambulatory Visit: Payer: Self-pay

## 2020-03-03 ENCOUNTER — Ambulatory Visit
Admission: RE | Admit: 2020-03-03 | Discharge: 2020-03-03 | Disposition: A | Discharge status: Home / Self Care | Payer: Medicare HMO | Source: Ambulatory Visit | Attending: Physical Medicine & Rehabilitation | Admitting: Physical Medicine & Rehabilitation

## 2020-03-03 DIAGNOSIS — M545 Low back pain: Secondary | ICD-10-CM | POA: Diagnosis not present

## 2020-03-03 DIAGNOSIS — M5416 Radiculopathy, lumbar region: Secondary | ICD-10-CM | POA: Insufficient documentation

## 2020-03-05 DIAGNOSIS — G8929 Other chronic pain: Secondary | ICD-10-CM | POA: Diagnosis not present

## 2020-03-05 DIAGNOSIS — M5441 Lumbago with sciatica, right side: Secondary | ICD-10-CM | POA: Diagnosis not present

## 2020-03-05 DIAGNOSIS — M48061 Spinal stenosis, lumbar region without neurogenic claudication: Secondary | ICD-10-CM | POA: Diagnosis not present

## 2020-03-05 DIAGNOSIS — M5442 Lumbago with sciatica, left side: Secondary | ICD-10-CM | POA: Diagnosis not present

## 2020-03-14 DIAGNOSIS — M5441 Lumbago with sciatica, right side: Secondary | ICD-10-CM | POA: Diagnosis not present

## 2020-03-14 DIAGNOSIS — M5442 Lumbago with sciatica, left side: Secondary | ICD-10-CM | POA: Diagnosis not present

## 2020-03-18 ENCOUNTER — Other Ambulatory Visit: Payer: Self-pay | Admitting: Internal Medicine

## 2020-03-18 ENCOUNTER — Telehealth: Payer: Self-pay

## 2020-03-18 ENCOUNTER — Telehealth: Payer: Self-pay | Admitting: Internal Medicine

## 2020-03-18 DIAGNOSIS — M5441 Lumbago with sciatica, right side: Secondary | ICD-10-CM

## 2020-03-18 MED ORDER — GABAPENTIN 100 MG PO CAPS: 100.0000 mg | ORAL_CAPSULE | Freq: Every day | ORAL | 0 refills | Status: DC

## 2020-03-18 NOTE — Telephone Encounter (Signed)
Medication Refill - Medication: gabapentin, baclofen   Has the patient contacted their pharmacy? Yes.   (Agent: If no, request that the patient contact the pharmacy for the refill.) (Agent: If yes, when and what did the pharmacy advise?)  Preferred Pharmacy (with phone number or street name):  [facility] ([facility] [place] - [address]  [place] [address]  Phone: [phone] Fax: [phone]  Hours: Not open 24 hours    Agent: Please be advised that RX refills may take up to 3 business days. We ask that you follow-up with your pharmacy.

## 2020-03-18 NOTE — Telephone Encounter (Signed)
Patient requesting Baclofen but not on current medication list. Please advise

## 2020-03-18 NOTE — Telephone Encounter (Signed)
Please advise patient RF request?  CM

## 2020-03-18 NOTE — Telephone Encounter (Signed)
Called pt told her that she has to call Rheumatology to get her Baclofen RF. Pt verbalized understanding.  KP

## 2020-03-18 NOTE — Telephone Encounter (Signed)
Called pt she just wanted a RF on Gabapentin and to see if Dr. Army Melia could RF her baclofen that was prescribed to her from another Dr.([facility]) Dr. Who prescribed it is no longer there and was replaced by another Dr. Who she has not seen yet). She only mentioned that she had a spinal injection and that she was still supposed to be talking her medications. I told her that her gabapentin was sent in today 03/18/2020 but we would have to call her back and let her know if Dr. Army Melia can RF her Baclofen. Pt verbalized understanding.  KP

## 2020-03-18 NOTE — Telephone Encounter (Unsigned)
Copied from [place] [id number]. Topic: General - Other >> Mar 18, 2020  9:21 AM Celene Kras wrote: Reason for CRM: Pt called and is requesting to speak with PCP regarding her spinal injection. Please advise.

## 2020-03-18 NOTE — Telephone Encounter (Signed)
Rheumatology ordered this for her - she needs to call them for a refill.

## 2020-03-29 DIAGNOSIS — M48061 Spinal stenosis, lumbar region without neurogenic claudication: Secondary | ICD-10-CM | POA: Diagnosis not present

## 2020-03-29 DIAGNOSIS — G8929 Other chronic pain: Secondary | ICD-10-CM | POA: Diagnosis not present

## 2020-03-29 DIAGNOSIS — M25551 Pain in right hip: Secondary | ICD-10-CM | POA: Diagnosis not present

## 2020-03-29 DIAGNOSIS — M5442 Lumbago with sciatica, left side: Secondary | ICD-10-CM | POA: Diagnosis not present

## 2020-03-29 DIAGNOSIS — M5441 Lumbago with sciatica, right side: Secondary | ICD-10-CM | POA: Diagnosis not present

## 2020-04-01 ENCOUNTER — Telehealth: Payer: Self-pay | Admitting: Internal Medicine

## 2020-04-01 ENCOUNTER — Other Ambulatory Visit: Payer: Self-pay | Admitting: Internal Medicine

## 2020-04-01 DIAGNOSIS — Z1231 Encounter for screening mammogram for malignant neoplasm of breast: Secondary | ICD-10-CM

## 2020-04-01 NOTE — Telephone Encounter (Signed)
Pt needs to be scheduled for a physical.  Thank you, KP

## 2020-04-01 NOTE — Telephone Encounter (Unsigned)
Copied from [place] [id number]. Topic: General - Other >> Apr 01, 2020  4:12 PM Sheran Luz wrote: Patient would like to know if she can get order for blood work specifically to check her cholesterol.

## 2020-04-02 NOTE — Telephone Encounter (Signed)
Pt set up to come in September for cpe

## 2020-04-03 ENCOUNTER — Ambulatory Visit (INDEPENDENT_AMBULATORY_CARE_PROVIDER_SITE_OTHER): Payer: Medicare HMO

## 2020-04-03 DIAGNOSIS — Z0000 Encounter for general adult medical examination without abnormal findings: Secondary | ICD-10-CM

## 2020-04-03 NOTE — Progress Notes (Signed)
Subjective:   Monica Campbell is a 79 y.o. female who presents for Medicare Annual (Subsequent) preventive examination.  Virtual Visit via Telephone Note  I connected with  Monica Campbell on 04/03/20 at 11:20 AM EDT by telephone and verified that I am speaking with the correct person using two identifiers.  Medicare Annual Wellness visit completed telephonically due to Covid-19 pandemic.   Location: Patient: home Provider: office   I discussed the limitations, risks, security and privacy concerns of performing an evaluation and management service by telephone and the availability of in person appointments. The patient expressed understanding and agreed to proceed.  Unable to perform video visit due to video visit attempted and failed and/or patient does not have video capability.   Some vital signs may be absent or patient reported.   Monica Marker, LPN    Review of Systems     Cardiac Risk Factors include: advanced age (>29men, >70 women);dyslipidemia;obesity (BMI >30kg/m2)     Objective:    Today's Vitals   04/03/20 1131  PainSc: 5    There is no height or weight on file to calculate BMI.  Advanced Directives 04/03/2020 04/03/2019 05/03/2018 03/23/2018 02/02/2018 07/05/2017 03/15/2017  Does Patient Have a Medical Advance Directive? No Yes No Yes No Yes Yes  Type of Advance Directive - [place];Living will - Monica Campbell;Living will - - Monica Campbell  Does patient want to make changes to medical advance directive? - No - Patient declined - - - - -  Copy of Monica Campbell in Chart? - No - copy requested - No - copy requested - - -  Would patient like information on creating a medical advance directive? No - Patient declined - No - Patient declined - No - Patient declined - -    Current Medications (verified) Outpatient Encounter Medications as of 04/03/2020  Medication Sig  . acetaminophen (TYLENOL) 650 MG CR  tablet Take 650 mg by mouth every 8 (eight) hours as needed for pain.  Marland Kitchen albuterol (PROVENTIL) (2.5 MG/3ML) 0.083% nebulizer solution Take 3 mLs by nebulization every 6 (six) hours as needed for wheezing or shortness of breath.  Marland Kitchen albuterol (VENTOLIN HFA) 108 (90 Base) MCG/ACT inhaler Inhale 2 puffs into the lungs every 4 (four) hours as needed for wheezing or shortness of breath.  Marland Kitchen aspirin 81 MG chewable tablet Chew 1 tablet by mouth daily.  . Baclofen 5 MG TABS Take 1 tablet by mouth 2 (two) times daily as needed.  . Calcium Carbonate-Vit D-Min (CALTRATE 600+D PLUS MINERALS) 600-800 MG-UNIT CHEW Chew 1 tablet by mouth daily.  . Cholecalciferol (VITAMIN D) 125 MCG (5000 UT) CAPS Take 1 capsule by mouth daily.  Marland Kitchen docusate sodium (COLACE) 100 MG capsule Take 1 capsule by mouth daily.  . famotidine (PEPCID) 20 MG tablet Take 20 mg by mouth daily.  . fluticasone (FLONASE) 50 MCG/ACT nasal spray Place 2 sprays into both nostrils daily.  . Fluticasone-Salmeterol (ADVAIR) 250-50 MCG/DOSE AEPB Inhale 1 puff into the lungs 2 (two) times daily.  Marland Kitchen gabapentin (NEURONTIN) 100 MG capsule Take 1-3 capsules (100-300 mg total) by mouth at bedtime.  . simvastatin (ZOCOR) 10 MG tablet Take 1 tablet (10 mg total) by mouth at bedtime. (Patient not taking: Reported on 04/03/2020)  . vitamin B-12 (CYANOCOBALAMIN) 250 MCG tablet Take by mouth. (Patient not taking: Reported on 04/03/2020)  . [DISCONTINUED] cetirizine (ZYRTEC) 10 MG tablet Take 10 mg by mouth daily.  No facility-administered encounter medications on file as of 04/03/2020.    Allergies (verified) Augmentin [amoxicillin-pot clavulanate] and Codeine   History: Past Medical History:  Diagnosis Date  . COPD (chronic obstructive pulmonary disease) ([place])   . Dyspnea   . GERD (gastroesophageal reflux disease)   . Hard of hearing   . Hypercholesteremia   . Osteoarthritis   . Seasonal allergies    Past Surgical History:  Procedure Laterality Date  .  CATARACT EXTRACTION W/PHACO Left 02/02/2018   Procedure: CATARACT EXTRACTION PHACO AND INTRAOCULAR LENS PLACEMENT ([place]) LEFT  MALYUGIN;  Surgeon: Leandrew Koyanagi, MD;  Location: [facility];  Service: Ophthalmology;  Laterality: Left;  . CATARACT EXTRACTION W/PHACO Right 05/03/2018   Procedure: CATARACT EXTRACTION PHACO AND INTRAOCULAR LENS PLACEMENT ([place]) RIGHT  IVA/TOPICAL;  Surgeon: Leandrew Koyanagi, MD;  Location: [place];  Service: Ophthalmology;  Laterality: Right;  . MINOR HEMORRHOIDECTOMY     Family History  Problem Relation Age of Onset  . Heart disease Mother   . Heart disease Sister   . Heart disease Brother   . Cancer Father        colon  . Breast cancer Neg Hx    Social History   Socioeconomic History  . Marital status: Widowed    Spouse name: Not on file  . Number of children: 3  . Years of education: Not on file  . Highest education level: 12th grade  Occupational History  . Occupation: Retired  Tobacco Use  . Smoking status: Former Smoker    Packs/day: 0.50    Years: 30.00    Pack years: 15.00    Types: Cigarettes    Quit date: 09/07/2000    Years since quitting: 19.5  . Smokeless tobacco: Never Used  . Tobacco comment: smoking cessation materials not required  Vaping Use  . Vaping Use: Never used  Substance and Sexual Activity  . Alcohol use: Yes    Alcohol/week: 1.0 standard drink    Types: 1 Glasses of wine per week    Comment: social  . Drug use: No  . Sexual activity: Not Currently  Other Topics Concern  . Not on file  Social History Narrative   Pt lives alone    Social Determinants of Health   Financial Resource Strain: Low Risk   . Difficulty of Paying Living Expenses: Not hard at all  Food Insecurity: No Food Insecurity  . Worried About Charity fundraiser in the Last Year: Never true  . Ran Out of Food in the Last Year: Never true  Transportation Needs: No Transportation Needs  . Lack of Transportation  (Medical): No  . Lack of Transportation (Non-Medical): No  Physical Activity: Insufficiently Active  . Days of Exercise per Week: 7 days  . Minutes of Exercise per Session: 20 min  Stress: No Stress Concern Present  . Feeling of Stress : Only a little  Social Connections: Socially Isolated  . Frequency of Communication with Friends and Family: More than three times a week  . Frequency of Social Gatherings with Friends and Family: Twice a week  . Attends Religious Services: Never  . Active Member of Clubs or Organizations: No  . Attends Archivist Meetings: Never  . Marital Status: Widowed    Tobacco Counseling Counseling given: Not Answered Comment: smoking cessation materials not required   Clinical Intake:  Pre-visit preparation completed: Yes  Pain : 0-10 Pain Score: 5  Pain Type: Acute pain Pain Location: Hip Pain Orientation:  Right Pain Descriptors / Indicators: Aching, Sore Pain Onset: 1 to 4 weeks ago Pain Frequency: Intermittent     Nutritional Risks: None Diabetes: No  How often do you need to have someone help you when you read instructions, pamphlets, or other written materials from your doctor or pharmacy?: 1 - Never    Interpreter Needed?: No  Information entered by :: Monica Marker LPN   Activities of Daily Living In your present state of health, do you have any difficulty performing the following activities: 04/03/2020  Hearing? Y  Comment wears hearing aids  Vision? N  Difficulty concentrating or making decisions? N  Walking or climbing stairs? N  Dressing or bathing? N  Doing errands, shopping? N  Preparing Food and eating ? N  Using the Toilet? N  In the past six months, have you accidently leaked urine? Y  Comment wears pads for protection  Do you have problems with loss of bowel control? N  Managing your Medications? N  Managing your Finances? N  Housekeeping or managing your Housekeeping? N  Some recent data might be hidden      Patient Care Team: Glean Hess, MD as PCP - General (Internal Medicine) Erby Pian, MD as Referring Physician (Pulmonary Disease) Leandrew Koyanagi, MD as Referring Physician (Ophthalmology) Dr. Renee Pain (Orthopedic Surgery)  Indicate any recent Medical Services you may have received from other than [facility] providers in the past year (date may be approximate).     Assessment:   This is a routine wellness examination for [place].  Hearing/Vision screen  Hearing Screening   125Hz  250Hz  500Hz  1000Hz  2000Hz  3000Hz  4000Hz  6000Hz  8000Hz   Right ear:           Left ear:           Comments: Pt wears hearing aids maintained by [employer]  Vision Screening Comments: Annual vision screenings done [facility] Dr. Wallace Going  Dietary issues and exercise activities discussed: Current Exercise Habits: Home exercise routine, Type of exercise: calisthenics;stretching, Time (Minutes): 20, Frequency (Times/Week): 7, Weekly Exercise (Minutes/Week): 140, Intensity: Mild, Exercise limited by: orthopedic condition(s)  Goals    . DIET - INCREASE WATER INTAKE     Recommend to drink at least 6-8 8oz glasses of water per day.    . Weight < 200 lb (90.719 kg)     Patient has lost 10-15 pounds being very active and would like to continue being active in her life as well as keep eating healthy.       Depression Screen PHQ 2/9 Scores 04/03/2020 01/24/2020 06/07/2019 04/03/2019 03/28/2019 01/20/2019 11/01/2018  PHQ - 2 Score 0 0 6 1 2  0 0  PHQ- 9 Score - 0 12 5 6  - -    Fall Risk Fall Risk  04/03/2020 01/24/2020 04/03/2019 03/28/2019 01/20/2019  Falls in the past year? 0 1 0 1 0  Comment - - - - -  Number falls in past yr: 0 1 0 0 0  Injury with Fall? 0 0 0 0 0  Comment - - - - -  Risk Factor Category  - - - - -  Comment - - - - -  Risk for fall due to : Orthopedic patient History of fall(s);Impaired balance/gait - History of fall(s);Impaired balance/gait -  Risk for fall due to: Comment  - - - - -  Follow up Falls prevention discussed Falls evaluation completed Falls prevention discussed Falls evaluation completed Falls evaluation completed    Any stairs in  or around the home? Yes  If so, are there any without handrails? No   Home free of loose throw rugs in walkways, pet beds, electrical cords, etc? Yes  Adequate lighting in your home to reduce risk of falls? Yes   ASSISTIVE DEVICES UTILIZED TO PREVENT FALLS:  Life alert? No  Use of a cane, walker or w/c? No  Grab bars in the bathroom? No  Shower chair or bench in shower? No  Elevated toilet seat or a handicapped toilet? No   TIMED UP AND GO:  Was the test performed? No . telephonic visit  Cognitive Function:     6CIT Screen 04/03/2020 04/03/2019 03/23/2018 03/15/2017 03/15/2017  What Year? 0 points 0 points 0 points 0 points 0 points  What month? 0 points 0 points 0 points 0 points 0 points  What time? 0 points 0 points 0 points 0 points -  Count back from 20 0 points 0 points 0 points 0 points -  Months in reverse 0 points 0 points 2 points 0 points -  Repeat phrase 0 points 0 points 2 points 2 points -  Total Score 0 0 4 2 -    Immunizations Immunization History  Administered Date(s) Administered  . Influenza, High Dose Seasonal PF 06/09/2017, 05/12/2018, 05/10/2019  . Influenza-Unspecified 05/18/2012, 07/24/2015  . PFIZER SARS-COV-2 Vaccination 11/06/2019, 11/26/2019  . Pneumococcal Conjugate-13 01/17/2014  . Pneumococcal Polysaccharide-23 02/06/2004, 02/20/2007, 12/16/2011  . Tdap 05/11/2014  . Zoster 02/04/2010    TDAP status: Up to date   Flu Vaccine status: Up to date   Pneumococcal vaccine status: Up to date   Covid-19 vaccine status: Completed vaccines  Qualifies for Shingles Vaccine? Yes   Zostavax completed Yes   Shingrix Completed?: No.    Education has been provided regarding the importance of this vaccine. Patient has been advised to call insurance company to determine out of pocket  expense if they have not yet received this vaccine. Advised may also receive vaccine at local pharmacy or Health Dept. Verbalized acceptance and understanding.  Screening Tests Health Maintenance  Topic Date Due  . Hepatitis C Screening  Never done  . INFLUENZA VACCINE  04/07/2020  . MAMMOGRAM  04/17/2020  . TETANUS/TDAP  05/11/2024  . DEXA SCAN  Completed  . COVID-19 Vaccine  Completed  . PNA vac Low Risk Adult  Completed    Health Maintenance  Health Maintenance Due  Topic Date Due  . Hepatitis C Screening  Never done    Colorectal cancer screening: No longer required.    Mammogram status: Completed 04/18/19. Repeat every year. Scheduled 04/23/20.   Bone Density status: Completed 04/14/18. Results reflect: Bone density results: OSTEOPOROSIS. Repeat every 2 years.  Lung Cancer Screening: (Low Dose CT Chest recommended if Age 64-80 years, 30 pack-year currently smoking OR have quit w/in 15years.) does not qualify.   Additional Screening:  Hepatitis C Screening: does qualify; postponed  Vision Screening: Recommended annual ophthalmology exams for early detection of glaucoma and other disorders of the eye. Is the patient up to date with their annual eye exam?  Yes  Who is the provider or what is the name of the office in which the patient attends annual eye exams? [place] Screening: Recommended annual dental exams for proper oral hygiene  Community Resource Referral / Chronic Care Management: CRR required this visit?  No   CCM required this visit?  No      Plan:     I have  personally reviewed and noted the following in the patient's chart:   . Medical and social history . Use of alcohol, tobacco or illicit drugs  . Current medications and supplements . Functional ability and status . Nutritional status . Physical activity . Advanced directives . List of other physicians . Hospitalizations, surgeries, and ER visits in previous 12  months . Vitals . Screenings to include cognitive, depression, and falls . Referrals and appointments  In addition, I have reviewed and discussed with patient certain preventive protocols, quality metrics, and best practice recommendations. A written personalized care plan for preventive services as well as general preventive health recommendations were provided to patient.     Monica Marker, LPN   [id number]   Nurse Notes: none

## 2020-04-03 NOTE — Patient Instructions (Signed)
Monica Campbell , Thank you for taking time to come for your Medicare Wellness Visit. I appreciate your ongoing commitment to your health goals. Please review the following plan we discussed and let me know if I can assist you in the future.   Screening recommendations/referrals: Colonoscopy: no longer required Mammogram: done 04/18/19. Scheduled for 04/23/20 Bone Density: done 04/14/18 Recommended yearly ophthalmology/optometry visit for glaucoma screening and checkup Recommended yearly dental visit for hygiene and checkup  Vaccinations: Influenza vaccine: done 05/10/19 Pneumococcal vaccine: done 01/17/14 Tdap vaccine: done 05/17/14 Shingles vaccine: Shingrix discussed. Please contact your pharmacy for coverage information.  Covid-19: done 11/06/19 & 11/26/19  Advanced directives: Please bring a copy of your health care power of attorney and living will to the office at your convenience once you have completed those documents.   Conditions/risks identified: Keep up the great work!  Next appointment: Follow up in one year for your annual wellness visit    Preventive Care 65 Years and Older, Female Preventive care refers to lifestyle choices and visits with your health care provider that can promote health and wellness. What does preventive care include?  A yearly physical exam. This is also called an annual well check.  Dental exams once or twice a year.  Routine eye exams. Ask your health care provider how often you should have your eyes checked.  Personal lifestyle choices, including:  Daily care of your teeth and gums.  Regular physical activity.  Eating a healthy diet.  Avoiding tobacco and drug use.  Limiting alcohol use.  Practicing safe sex.  Taking low-dose aspirin every day.  Taking vitamin and mineral supplements as recommended by your health care provider. What happens during an annual well check? The services and screenings done by your health care provider during your  annual well check will depend on your age, overall health, lifestyle risk factors, and family history of disease. Counseling  Your health care provider may ask you questions about your:  Alcohol use.  Tobacco use.  Drug use.  Emotional well-being.  Home and relationship well-being.  Sexual activity.  Eating habits.  History of falls.  Memory and ability to understand (cognition).  Work and work Statistician.  Reproductive health. Screening  You may have the following tests or measurements:  Height, weight, and BMI.  Blood pressure.  Lipid and cholesterol levels. These may be checked every 5 years, or more frequently if you are over 57 years old.  Skin check.  Lung cancer screening. You may have this screening every year starting at age 1 if you have a 30-pack-year history of smoking and currently smoke or have quit within the past 15 years.  Fecal occult blood test (FOBT) of the stool. You may have this test every year starting at age 30.  Flexible sigmoidoscopy or colonoscopy. You may have a sigmoidoscopy every 5 years or a colonoscopy every 10 years starting at age 75.  Hepatitis C blood test.  Hepatitis B blood test.  Sexually transmitted disease (STD) testing.  Diabetes screening. This is done by checking your blood sugar (glucose) after you have not eaten for a while (fasting). You may have this done every 1-3 years.  Bone density scan. This is done to screen for osteoporosis. You may have this done starting at age 29.  Mammogram. This may be done every 1-2 years. Talk to your health care provider about how often you should have regular mammograms. Talk with your health care provider about your test results, treatment options, and  if necessary, the need for more tests. Vaccines  Your health care provider may recommend certain vaccines, such as:  Influenza vaccine. This is recommended every year.  Tetanus, diphtheria, and acellular pertussis (Tdap, Td)  vaccine. You may need a Td booster every 10 years.  Zoster vaccine. You may need this after age 32.  Pneumococcal 13-valent conjugate (PCV13) vaccine. One dose is recommended after age 78.  Pneumococcal polysaccharide (PPSV23) vaccine. One dose is recommended after age 1. Talk to your health care provider about which screenings and vaccines you need and how often you need them. This information is not intended to replace advice given to you by your health care provider. Make sure you discuss any questions you have with your health care provider. Document Released: 09/20/2015 Document Revised: 05/13/2016 Document Reviewed: 06/25/2015 Elsevier Interactive Patient Education  2017 [place] Prevention in the Home Falls can cause injuries. They can happen to people of all ages. There are many things you can do to make your home safe and to help prevent falls. What can I do on the outside of my home?  Regularly fix the edges of walkways and driveways and fix any cracks.  Remove anything that might make you trip as you walk through a door, such as a raised step or threshold.  Trim any bushes or trees on the path to your home.  Use bright outdoor lighting.  Clear any walking paths of anything that might make someone trip, such as rocks or tools.  Regularly check to see if handrails are loose or broken. Make sure that both sides of any steps have handrails.  Any raised decks and porches should have guardrails on the edges.  Have any leaves, snow, or ice cleared regularly.  Use sand or salt on walking paths during winter.  Clean up any spills in your garage right away. This includes oil or grease spills. What can I do in the bathroom?  Use night lights.  Install grab bars by the toilet and in the tub and shower. Do not use towel bars as grab bars.  Use non-skid mats or decals in the tub or shower.  If you need to sit down in the shower, use a plastic, non-slip  stool.  Keep the floor dry. Clean up any water that spills on the floor as soon as it happens.  Remove soap buildup in the tub or shower regularly.  Attach bath mats securely with double-sided non-slip rug tape.  Do not have throw rugs and other things on the floor that can make you trip. What can I do in the bedroom?  Use night lights.  Make sure that you have a light by your bed that is easy to reach.  Do not use any sheets or blankets that are too big for your bed. They should not hang down onto the floor.  Have a firm chair that has side arms. You can use this for support while you get dressed.  Do not have throw rugs and other things on the floor that can make you trip. What can I do in the kitchen?  Clean up any spills right away.  Avoid walking on wet floors.  Keep items that you use a lot in easy-to-reach places.  If you need to reach something above you, use a strong step stool that has a grab bar.  Keep electrical cords out of the way.  Do not use floor polish or wax that makes floors slippery. If you  must use wax, use non-skid floor wax.  Do not have throw rugs and other things on the floor that can make you trip. What can I do with my stairs?  Do not leave any items on the stairs.  Make sure that there are handrails on both sides of the stairs and use them. Fix handrails that are broken or loose. Make sure that handrails are as long as the stairways.  Check any carpeting to make sure that it is firmly attached to the stairs. Fix any carpet that is loose or worn.  Avoid having throw rugs at the top or bottom of the stairs. If you do have throw rugs, attach them to the floor with carpet tape.  Make sure that you have a light switch at the top of the stairs and the bottom of the stairs. If you do not have them, ask someone to add them for you. What else can I do to help prevent falls?  Wear shoes that:  Do not have high heels.  Have rubber bottoms.  Are  comfortable and fit you well.  Are closed at the toe. Do not wear sandals.  If you use a stepladder:  Make sure that it is fully opened. Do not climb a closed stepladder.  Make sure that both sides of the stepladder are locked into place.  Ask someone to hold it for you, if possible.  Clearly mark and make sure that you can see:  Any grab bars or handrails.  First and last steps.  Where the edge of each step is.  Use tools that help you move around (mobility aids) if they are needed. These include:  Canes.  Walkers.  Scooters.  Crutches.  Turn on the lights when you go into a dark area. Replace any light bulbs as soon as they burn out.  Set up your furniture so you have a clear path. Avoid moving your furniture around.  If any of your floors are uneven, fix them.  If there are any pets around you, be aware of where they are.  Review your medicines with your doctor. Some medicines can make you feel dizzy. This can increase your chance of falling. Ask your doctor what other things that you can do to help prevent falls. This information is not intended to replace advice given to you by your health care provider. Make sure you discuss any questions you have with your health care provider. Document Released: 06/20/2009 Document Revised: 01/30/2016 Document Reviewed: 09/28/2014 Elsevier Interactive Patient Education  2017 [employer].

## 2020-04-04 DIAGNOSIS — M25551 Pain in right hip: Secondary | ICD-10-CM | POA: Diagnosis not present

## 2020-04-15 ENCOUNTER — Other Ambulatory Visit: Payer: Medicare HMO

## 2020-04-19 DIAGNOSIS — M5441 Lumbago with sciatica, right side: Secondary | ICD-10-CM | POA: Diagnosis not present

## 2020-04-19 DIAGNOSIS — M25551 Pain in right hip: Secondary | ICD-10-CM | POA: Diagnosis not present

## 2020-04-19 DIAGNOSIS — G8929 Other chronic pain: Secondary | ICD-10-CM | POA: Diagnosis not present

## 2020-04-19 DIAGNOSIS — M5442 Lumbago with sciatica, left side: Secondary | ICD-10-CM | POA: Diagnosis not present

## 2020-04-19 DIAGNOSIS — M48061 Spinal stenosis, lumbar region without neurogenic claudication: Secondary | ICD-10-CM | POA: Diagnosis not present

## 2020-04-23 ENCOUNTER — Other Ambulatory Visit: Payer: Self-pay

## 2020-04-23 ENCOUNTER — Ambulatory Visit
Admission: RE | Admit: 2020-04-23 | Discharge: 2020-04-23 | Disposition: A | Discharge status: Home / Self Care | Payer: Medicare HMO | Source: Ambulatory Visit | Attending: Internal Medicine | Admitting: Internal Medicine

## 2020-04-23 DIAGNOSIS — Z1231 Encounter for screening mammogram for malignant neoplasm of breast: Secondary | ICD-10-CM | POA: Diagnosis not present

## 2020-04-25 DIAGNOSIS — M5441 Lumbago with sciatica, right side: Secondary | ICD-10-CM | POA: Diagnosis not present

## 2020-05-09 DIAGNOSIS — M5442 Lumbago with sciatica, left side: Secondary | ICD-10-CM | POA: Diagnosis not present

## 2020-05-09 DIAGNOSIS — G8929 Other chronic pain: Secondary | ICD-10-CM | POA: Diagnosis not present

## 2020-05-09 DIAGNOSIS — M48061 Spinal stenosis, lumbar region without neurogenic claudication: Secondary | ICD-10-CM | POA: Diagnosis not present

## 2020-05-09 DIAGNOSIS — M25551 Pain in right hip: Secondary | ICD-10-CM | POA: Diagnosis not present

## 2020-05-09 DIAGNOSIS — M5441 Lumbago with sciatica, right side: Secondary | ICD-10-CM | POA: Diagnosis not present

## 2020-05-31 IMAGING — MG DIGITAL SCREENING BILATERAL MAMMOGRAM WITH TOMO AND CAD
8 series · 8 of 24 positions shown · non-contrast
Comparison: Previous exam(s).

CLINICAL DATA: Screening.

EXAM:
DIGITAL SCREENING BILATERAL MAMMOGRAM WITH TOMO AND CAD

[R MLO synth-2D]
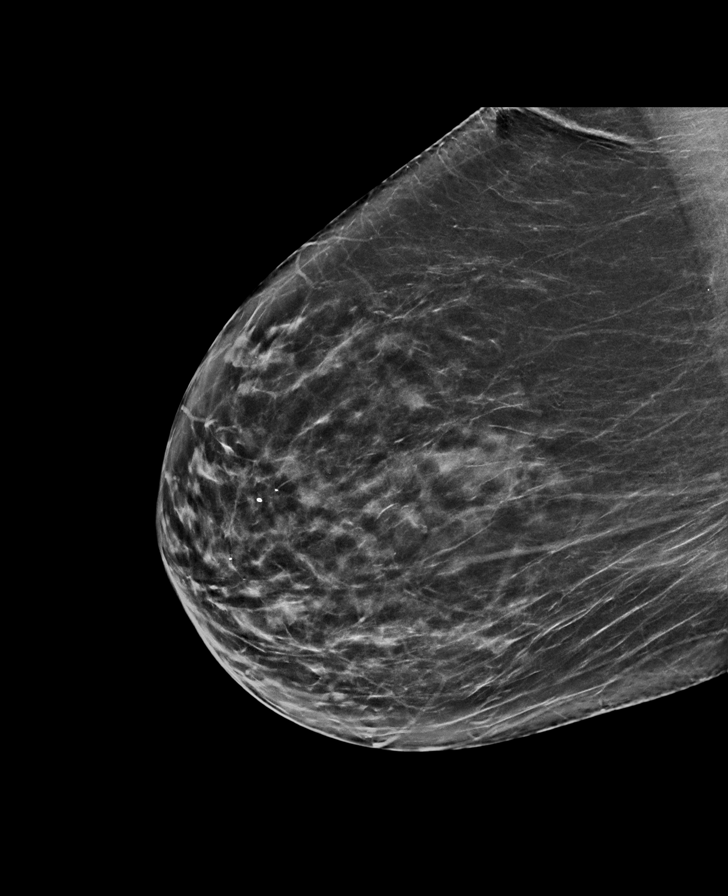

[R CC synth-2D]
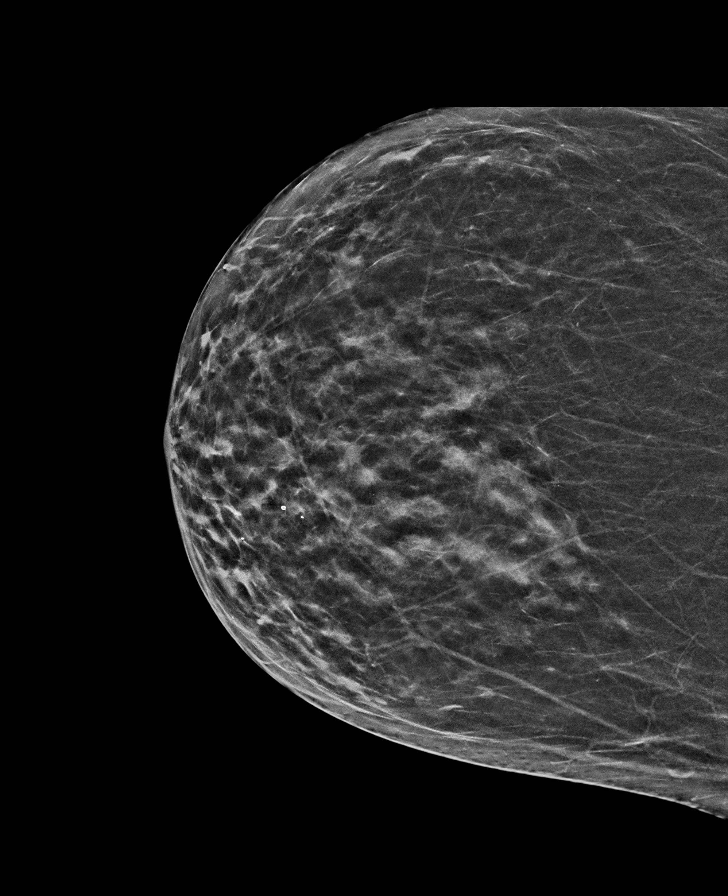

[L CC synth-2D]
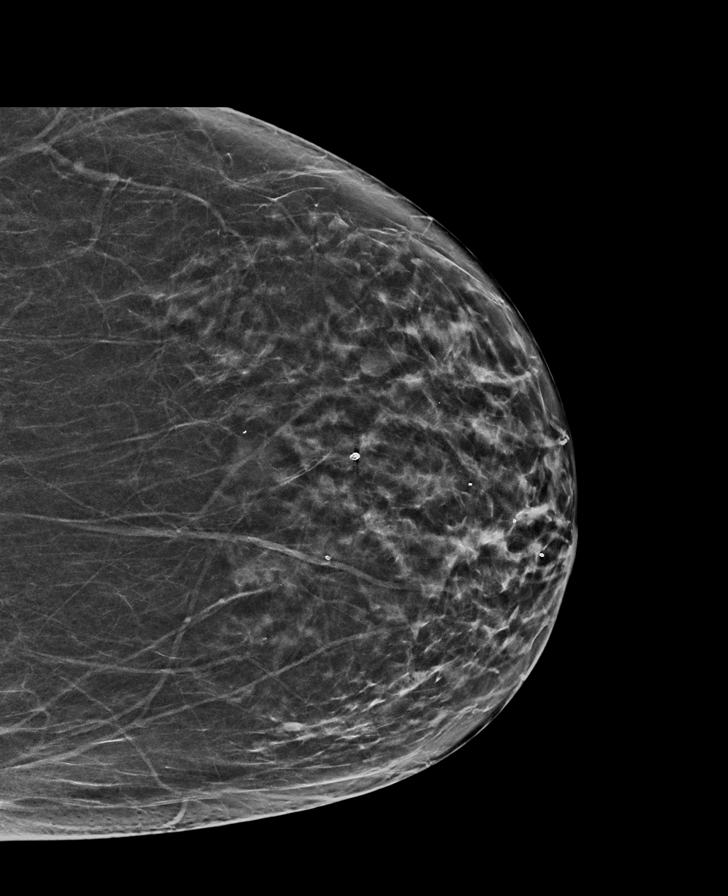

[L MLO synth-2D]
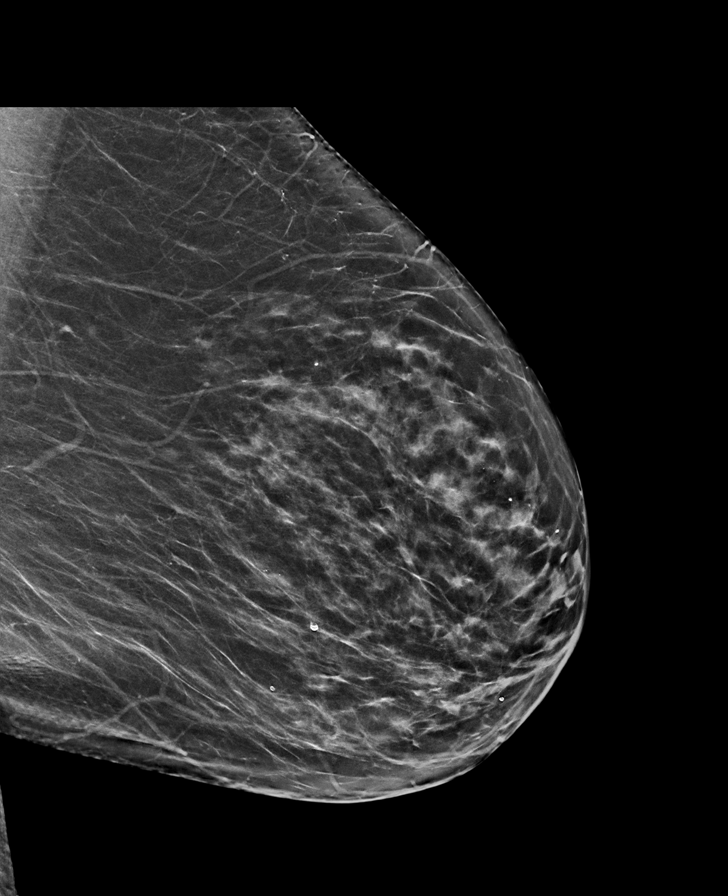

[R MLO tomo · tomo slice 37/73.0]
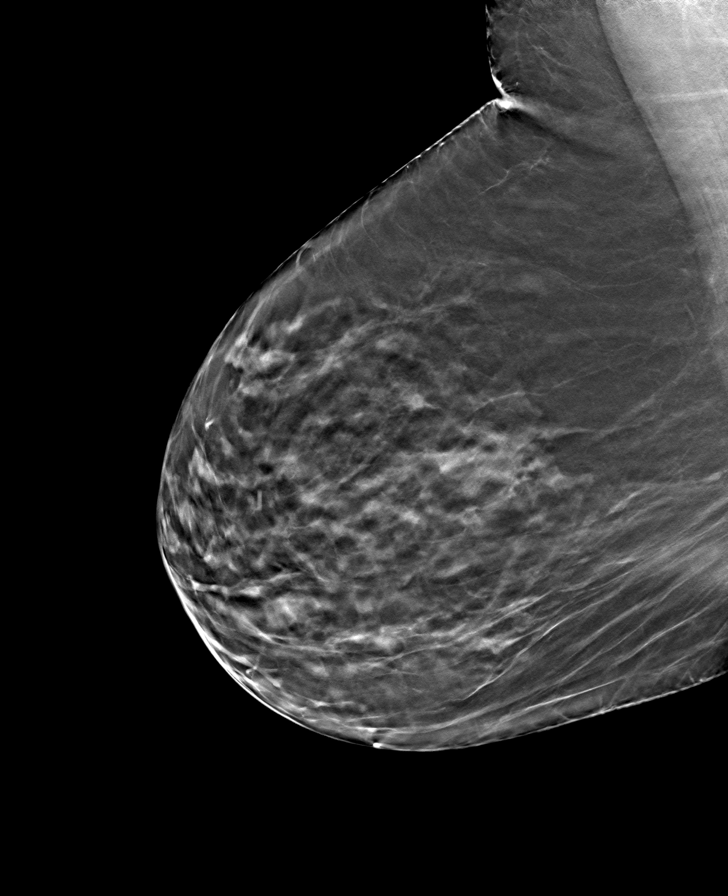

[L MLO tomo · tomo slice 35/70.0]
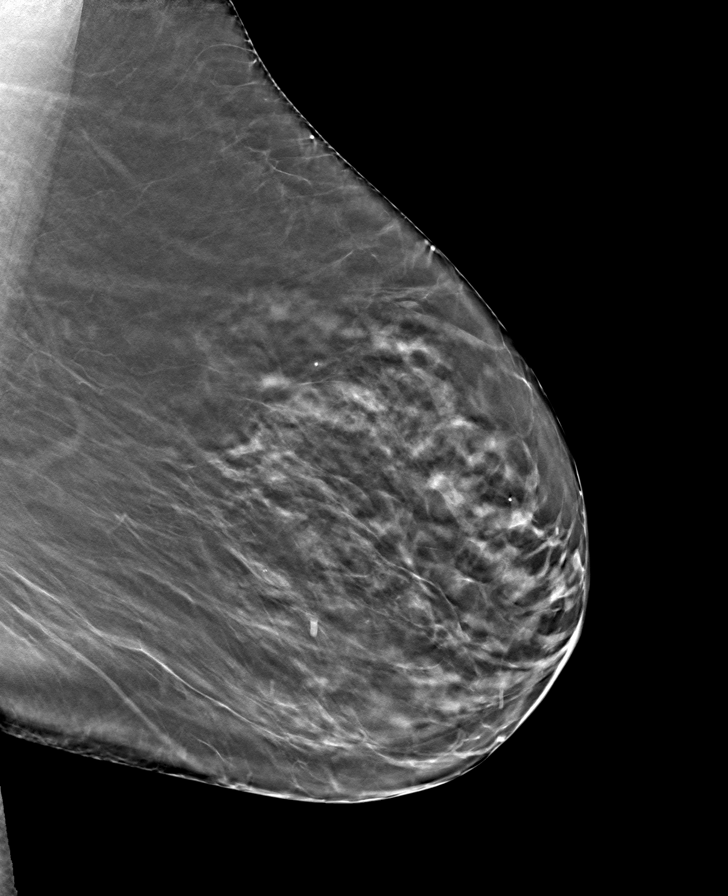

[L CC tomo · tomo slice 33/64.0]
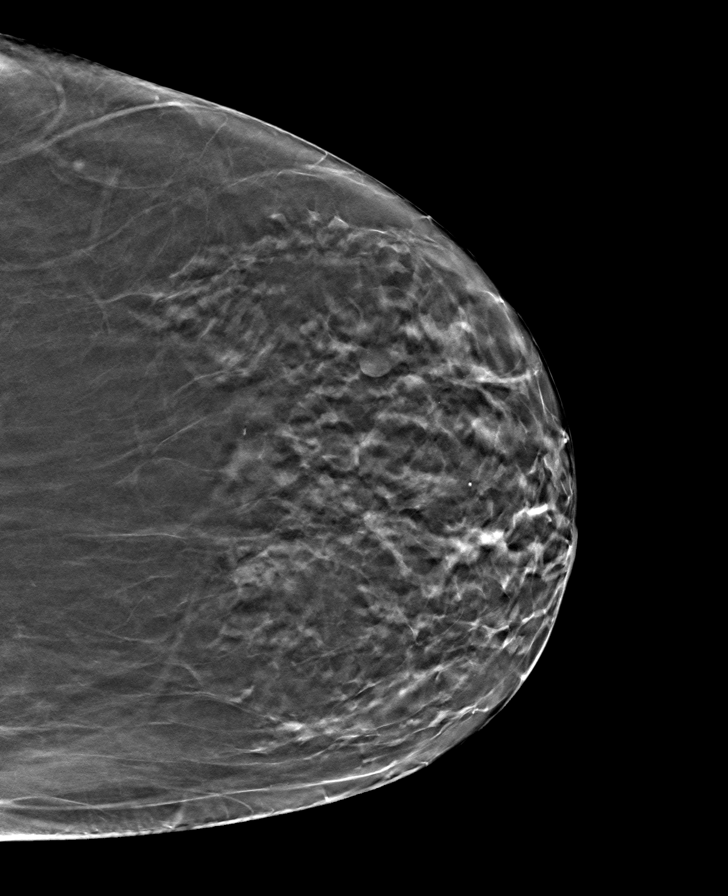

[R CC tomo · tomo slice 29/58.0]
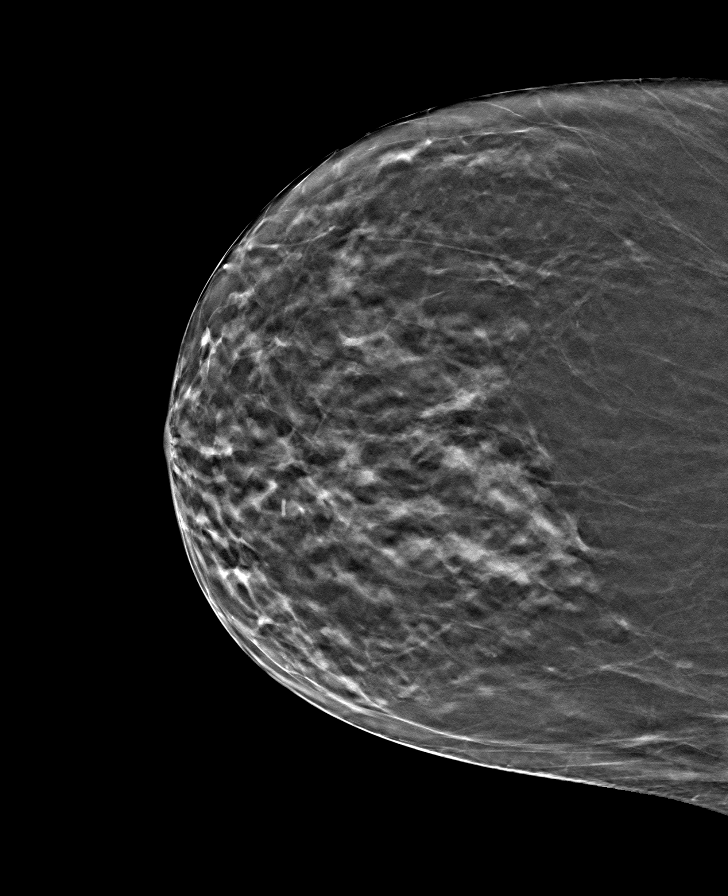

[8 of 24 positions shown; findings below may reference images not displayed]

ACR Breast Density Category c: The breast tissue is heterogeneously
dense, which may obscure small masses.
FINDINGS: There are no findings suspicious for malignancy. Images were
processed with CAD.
IMPRESSION: No mammographic evidence of malignancy. A result letter of this
screening mammogram will be mailed directly to the patient.

RECOMMENDATION:
Screening mammogram in one year. (Code:FT-U-LHB)

BI-RADS CATEGORY  1: Negative.

## 2020-06-04 ENCOUNTER — Encounter: Payer: Self-pay | Admitting: Internal Medicine

## 2020-06-04 ENCOUNTER — Other Ambulatory Visit: Payer: Self-pay

## 2020-06-04 ENCOUNTER — Ambulatory Visit (INDEPENDENT_AMBULATORY_CARE_PROVIDER_SITE_OTHER): Payer: Medicare HMO | Admitting: Internal Medicine

## 2020-06-04 VITALS — BP 142/62 | HR 77 | Temp 98.0°F | Ht 62.0 in | Wt 176.0 lb

## 2020-06-04 DIAGNOSIS — M5442 Lumbago with sciatica, left side: Secondary | ICD-10-CM

## 2020-06-04 DIAGNOSIS — M25472 Effusion, left ankle: Secondary | ICD-10-CM

## 2020-06-04 DIAGNOSIS — M5441 Lumbago with sciatica, right side: Secondary | ICD-10-CM | POA: Diagnosis not present

## 2020-06-04 DIAGNOSIS — Z0000 Encounter for general adult medical examination without abnormal findings: Secondary | ICD-10-CM

## 2020-06-04 DIAGNOSIS — Z23 Encounter for immunization: Secondary | ICD-10-CM

## 2020-06-04 DIAGNOSIS — R233 Spontaneous ecchymoses: Secondary | ICD-10-CM | POA: Insufficient documentation

## 2020-06-04 DIAGNOSIS — E559 Vitamin D deficiency, unspecified: Secondary | ICD-10-CM

## 2020-06-04 DIAGNOSIS — M25471 Effusion, right ankle: Secondary | ICD-10-CM | POA: Diagnosis not present

## 2020-06-04 DIAGNOSIS — E782 Mixed hyperlipidemia: Secondary | ICD-10-CM

## 2020-06-04 DIAGNOSIS — M48062 Spinal stenosis, lumbar region with neurogenic claudication: Secondary | ICD-10-CM | POA: Insufficient documentation

## 2020-06-04 DIAGNOSIS — Z1159 Encounter for screening for other viral diseases: Secondary | ICD-10-CM

## 2020-06-04 DIAGNOSIS — J439 Emphysema, unspecified: Secondary | ICD-10-CM

## 2020-06-04 LAB — POCT URINALYSIS DIPSTICK
Bilirubin, UA: NEGATIVE
Blood, UA: NEGATIVE
Glucose, UA: NEGATIVE
Ketones, UA: NEGATIVE
Nitrite, UA: NEGATIVE
Protein, UA: NEGATIVE
Spec Grav, UA: 1.025 (ref 1.010–1.025)
Urobilinogen, UA: 0.2 E.U./dL
pH, UA: 6 (ref 5.0–8.0)

## 2020-06-04 MED ORDER — GABAPENTIN 100 MG PO CAPS: 200.0000 mg | ORAL_CAPSULE | Freq: Every day | ORAL | 1 refills | Status: DC

## 2020-06-04 NOTE — Patient Instructions (Addendum)
Use Treligy in place of Advair every day.  Use one inhalation every day.

## 2020-06-04 NOTE — Progress Notes (Signed)
Date:  06/04/2020   Name:  Monica Campbell   DOB:  08/27/1941   MRN:  [id number]   Chief Complaint: Annual Exam (breast exam no pap) and Flu Vaccine  Monica Campbell is a 79 y.o. female who presents today for her Complete Annual Exam. She feels well. She reports exercising stretches evry morning. She reports she is sleeping poorly. Breast complaints none.  Mammogram: 04/2020 DEXA: 04/2018 osteoporosis of hip Pap smear: discontinued Colonoscopy: 2013 aged out  Immunization History  Administered Date(s) Administered  . Influenza, High Dose Seasonal PF 06/09/2017, 05/12/2018, 05/10/2019  . Influenza-Unspecified 05/18/2012, 07/24/2015  . PFIZER SARS-COV-2 Vaccination 11/06/2019, 11/26/2019  . Pneumococcal Conjugate-13 01/17/2014  . Pneumococcal Polysaccharide-23 02/06/2004, 02/20/2007, 12/16/2011  . Tdap 05/11/2014  . Zoster 02/04/2010    Hyperlipidemia This is a chronic problem. The problem is controlled. Associated symptoms include shortness of breath. Pertinent negatives include no chest pain. She is currently on no antihyperlipidemic treatment. Compliance problems include medication side effects (causes leg pain).   Back Pain This is a chronic problem. The pain is present in the lumbar spine. The quality of the pain is described as aching and burning. The pain radiates to the left knee. The pain is moderate. Pertinent negatives include no abdominal pain, chest pain, dysuria, fever, headaches or numbness. She has tried muscle relaxant (and steroid injections) for the symptoms. The treatment provided moderate relief.  COPD - on Advair bid with albuterol MDI and nebulizer to use PRN. She sees Dr. Raul Del but has been seen in a while. She is up to date on pneumonia and covid vaccines.  She is due for influenza vaccine.  Lab Results  Component Value Date   CREATININE 0.74 06/07/2019   BUN 10 03/28/2019   NA 139 03/28/2019   K 4.3 03/28/2019   CL 99 03/28/2019   CO2 23 03/28/2019     Lab Results  Component Value Date   CHOL 226 (H) 03/28/2019   HDL 59 03/28/2019   LDLCALC 126 (H) 03/28/2019   TRIG 205 (H) 03/28/2019   CHOLHDL 3.8 03/28/2019   Lab Results  Component Value Date   TSH 3.770 03/28/2019   No results found for: HGBA1C Lab Results  Component Value Date   WBC 10.9 (H) 03/28/2019   HGB 13.4 03/28/2019   HCT 39.5 03/28/2019   MCV 84 03/28/2019   PLT 317 03/28/2019   Lab Results  Component Value Date   ALT 8 06/07/2019   AST 14 06/07/2019   ALKPHOS 61 06/07/2019   BILITOT <0.2 06/07/2019   Last vitamin D Lab Results  Component Value Date   VD25OH 26.3 (L) 01/24/2020    Review of Systems  Constitutional: Positive for fatigue. Negative for appetite change, chills, fever and unexpected weight change.  HENT: Positive for congestion and hearing loss. Negative for tinnitus, trouble swallowing and voice change.   Eyes: Negative for visual disturbance.  Respiratory: Positive for shortness of breath and wheezing. Negative for cough and chest tightness.   Cardiovascular: Positive for leg swelling (ankle). Negative for chest pain and palpitations.  Gastrointestinal: Positive for constipation. Negative for abdominal pain, diarrhea and vomiting.  Endocrine: Negative for polydipsia and polyuria.  Genitourinary: Negative for dysuria, frequency, genital sores, hematuria, vaginal bleeding and vaginal discharge.  Musculoskeletal: Positive for back pain and joint swelling. Negative for arthralgias and gait problem.  Skin: Negative for color change and rash.  Neurological: Negative for dizziness, tremors, light-headedness, numbness and headaches.  Hematological: Negative for  adenopathy. Bruises/bleeds easily.  Psychiatric/Behavioral: Negative for dysphoric mood and sleep disturbance. The patient is nervous/anxious.     Patient Active Problem List   Diagnosis Date Noted  . Arthritis of both acromioclavicular joints 09/05/2019  . Bursitis of right  shoulder 07/05/2019  . Venous insufficiency of right leg 06/21/2019  . Myalgia 06/21/2019  . Vitamin D deficiency 06/21/2019  . Carpal tunnel syndrome 04/10/2019  . Acquired trigger finger 04/03/2019  . Numbness of hand 04/03/2019  . Foot pain, left 12/17/2017  . Elevated TSH 05/03/2017  . Osteoporosis 03/24/2016  . Chronic dermatitis 07/09/2015  . Allergic rhinitis, seasonal 12/12/2014  . Gastro-esophageal reflux disease without esophagitis 12/12/2014  . Mixed hyperlipidemia 12/12/2014  . Degenerative arthritis of hip 12/12/2014  . Chronic obstructive pulmonary disease, unspecified ([place]) 10/11/2014  . Bilateral hearing loss 07/15/2014    Allergies  Allergen Reactions  . Augmentin [Amoxicillin-Pot Clavulanate] Itching    Itching all over.  . Codeine Rash    Past Surgical History:  Procedure Laterality Date  . CATARACT EXTRACTION W/PHACO Left 02/02/2018   Procedure: CATARACT EXTRACTION PHACO AND INTRAOCULAR LENS PLACEMENT ([place]) LEFT  MALYUGIN;  Surgeon: Leandrew Koyanagi, MD;  Location: [facility];  Service: Ophthalmology;  Laterality: Left;  . CATARACT EXTRACTION W/PHACO Right 05/03/2018   Procedure: CATARACT EXTRACTION PHACO AND INTRAOCULAR LENS PLACEMENT ([place]) RIGHT  IVA/TOPICAL;  Surgeon: Leandrew Koyanagi, MD;  Location: [facility];  Service: Ophthalmology;  Laterality: Right;  . MINOR HEMORRHOIDECTOMY      Social History   Tobacco Use  . Smoking status: Former Smoker    Packs/day: 0.50    Years: 30.00    Pack years: 15.00    Types: Cigarettes    Quit date: 09/07/2000    Years since quitting: 19.7  . Smokeless tobacco: Never Used  . Tobacco comment: smoking cessation materials not required  Vaping Use  . Vaping Use: Never used  Substance Use Topics  . Alcohol use: Yes    Alcohol/week: 1.0 standard drink    Types: 1 Glasses of wine per week    Comment: social  . Drug use: No     Medication list has been reviewed and updated.  Current  Meds  Medication Sig  . acetaminophen (TYLENOL) 650 MG CR tablet Take 650 mg by mouth every 8 (eight) hours as needed for pain.  Marland Kitchen albuterol (PROVENTIL) (2.5 MG/3ML) 0.083% nebulizer solution Take 3 mLs by nebulization every 6 (six) hours as needed for wheezing or shortness of breath.  Marland Kitchen albuterol (VENTOLIN HFA) 108 (90 Base) MCG/ACT inhaler Inhale 2 puffs into the lungs every 4 (four) hours as needed for wheezing or shortness of breath.  Marland Kitchen aspirin 81 MG chewable tablet Chew 1 tablet by mouth daily.  . Calcium Carbonate-Vit D-Min (CALTRATE 600+D PLUS MINERALS) 600-800 MG-UNIT CHEW Chew 1 tablet by mouth daily.  . Cholecalciferol (VITAMIN D) 125 MCG (5000 UT) CAPS Take 1 capsule by mouth daily.  Marland Kitchen docusate sodium (COLACE) 100 MG capsule Take 1 capsule by mouth daily.  . famotidine (PEPCID) 20 MG tablet Take 20 mg by mouth daily.  . fluticasone (FLONASE) 50 MCG/ACT nasal spray Place 2 sprays into both nostrils daily.  Marland Kitchen gabapentin (NEURONTIN) 100 MG capsule Take 1-3 capsules (100-300 mg total) by mouth at bedtime.    PHQ 2/9 Scores 04/03/2020 01/24/2020 06/07/2019 04/03/2019  PHQ - 2 Score 0 0 6 1  PHQ- 9 Score - 0 12 5    No flowsheet data found.  BP Readings  from Last 3 Encounters:  06/04/20 (!) 142/62  01/24/20 128/64  06/07/19 136/78    Physical Exam Vitals and nursing note reviewed.  Constitutional:      General: She is not in acute distress.    Appearance: She is well-developed.  HENT:     Head: Normocephalic and atraumatic.     Right Ear: Tympanic membrane and ear canal normal. Decreased hearing noted.     Left Ear: Tympanic membrane and ear canal normal. Decreased hearing noted.     Nose:     Right Sinus: No maxillary sinus tenderness.     Left Sinus: No maxillary sinus tenderness.  Eyes:     General: No scleral icterus.       Right eye: No discharge.        Left eye: No discharge.     Conjunctiva/sclera: Conjunctivae normal.  Neck:     Thyroid: No thyromegaly.      Vascular: No carotid bruit.  Cardiovascular:     Rate and Rhythm: Normal rate and regular rhythm.     Pulses: Normal pulses.     Heart sounds: Normal heart sounds.  Pulmonary:     Effort: Pulmonary effort is normal. No respiratory distress.     Breath sounds: No decreased air movement. Decreased breath sounds present. No wheezing or rhonchi.  Chest:     Breasts:        Right: No mass, nipple discharge, skin change or tenderness.        Left: No mass, nipple discharge, skin change or tenderness.  Abdominal:     General: Bowel sounds are normal.     Palpations: Abdomen is soft.     Tenderness: There is no abdominal tenderness.  Musculoskeletal:        General: No tenderness, deformity or signs of injury.     Cervical back: Normal range of motion. No erythema.     Right lower leg: No edema (mild).     Left lower leg: No edema (mild).  Lymphadenopathy:     Cervical: No cervical adenopathy.  Skin:    General: Skin is warm and dry.     Capillary Refill: Capillary refill takes less than 2 seconds.     Findings: Ecchymosis (left forearm) present. No rash.          Comments: Small patch of eczema Scattered AK and SK on back and abdomen   Neurological:     General: No focal deficit present.     Mental Status: She is alert and oriented to person, place, and time.     Cranial Nerves: No cranial nerve deficit.     Sensory: No sensory deficit.     Deep Tendon Reflexes: Reflexes are normal and symmetric.  Psychiatric:        Attention and Perception: Attention normal.        Mood and Affect: Mood normal.     Wt Readings from Last 3 Encounters:  06/04/20 176 lb (79.8 kg)  01/24/20 178 lb (80.7 kg)  06/07/19 179 lb (81.2 kg)    BP (!) 142/62 (BP Location: Right Arm, Patient Position: Sitting)   Pulse 77   Temp 98 F (36.7 C) (Oral)   Ht 5\' 2"  (1.575 m)   Wt 176 lb (79.8 kg)   SpO2 93%   BMI 32.19 kg/m   Assessment and Plan: 1. Annual physical exam Continue exercise as  able; work on diet improvement Limit sodium to help control edema; elevate as needed -  CBC with Differential/Platelet - TSH - POCT urinalysis dipstick  2. Pulmonary emphysema, unspecified emphysema type ([place]) Followed by Dr. Raul Del Still having SOB and wheezing but also not using Advair every day Sample of Treligy given She was told by insurance that she could get a portable nebulizer - For home use only DME Nebulizer machine  3. Mixed hyperlipidemia Intolerant of statin therapy Discussed diet alone given only age as a risk factor - Comprehensive metabolic panel - Lipid panel  4. Vitamin D deficiency Continue supplementation  5. Need for hepatitis C screening test - Hepatitis C antibody  6. Low back pain due to bilateral sciatica Followed by Ortho Will refill gabapentin - gabapentin (NEURONTIN) 100 MG capsule; Take 2 capsules (200 mg total) by mouth at bedtime.  Dispense: 180 capsule; Refill: 1  7. Senile ecchymosis Pt is reassured   Partially dictated using Editor, commissioning. Any errors are unintentional.  Halina Maidens, MD [facility]  06/04/2020

## 2020-06-05 LAB — HEPATITIS C ANTIBODY: Hep C Virus Ab: 0.1 s/co ratio (ref 0.0–0.9)

## 2020-06-05 LAB — CBC WITH DIFFERENTIAL/PLATELET
Basophils Absolute: 0.1 10*3/uL (ref 0.0–0.2)
Basos: 1 %
EOS (ABSOLUTE): 0.1 10*3/uL (ref 0.0–0.4)
Eos: 1 %
Hematocrit: 39.3 % (ref 34.0–46.6)
Hemoglobin: 13 g/dL (ref 11.1–15.9)
Immature Grans (Abs): 0 10*3/uL (ref 0.0–0.1)
Immature Granulocytes: 0 %
Lymphocytes Absolute: 2.7 10*3/uL (ref 0.7–3.1)
Lymphs: 27 %
MCH: 29.5 pg (ref 26.6–33.0)
MCHC: 33.1 g/dL (ref 31.5–35.7)
MCV: 89 fL (ref 79–97)
Monocytes Absolute: 0.8 10*3/uL (ref 0.1–0.9)
Monocytes: 8 %
Neutrophils Absolute: 6.4 10*3/uL (ref 1.4–7.0)
Neutrophils: 63 %
Platelets: 262 10*3/uL (ref 150–450)
RBC: 4.41 x10E6/uL (ref 3.77–5.28)
RDW: 12.8 % (ref 11.7–15.4)
WBC: 10.1 10*3/uL (ref 3.4–10.8)

## 2020-06-05 LAB — COMPREHENSIVE METABOLIC PANEL
ALT: 9 IU/L (ref 0–32)
AST: 13 IU/L (ref 0–40)
Albumin/Globulin Ratio: 1.8 (ref 1.2–2.2)
Albumin: 4.4 g/dL (ref 3.7–4.7)
Alkaline Phosphatase: 61 IU/L (ref 44–121)
BUN/Creatinine Ratio: 14 (ref 12–28)
BUN: 11 mg/dL (ref 8–27)
Bilirubin Total: 0.5 mg/dL (ref 0.0–1.2)
CO2: 25 mmol/L (ref 20–29)
Calcium: 9.4 mg/dL (ref 8.7–10.3)
Chloride: 101 mmol/L (ref 96–106)
Creatinine, Ser: 0.79 mg/dL (ref 0.57–1.00)
GFR calc Af Amer: 82 mL/min/{1.73_m2} (ref >59)
GFR calc non Af Amer: 71 mL/min/{1.73_m2} (ref >59)
Globulin, Total: 2.4 g/dL (ref 1.5–4.5)
Glucose: 95 mg/dL (ref 65–99)
Potassium: 4 mmol/L (ref 3.5–5.2)
Sodium: 140 mmol/L (ref 134–144)
Total Protein: 6.8 g/dL (ref 6.0–8.5)

## 2020-06-05 LAB — LIPID PANEL
Chol/HDL Ratio: 4.2 ratio (ref 0.0–4.4)
Cholesterol, Total: 278 mg/dL — ABNORMAL HIGH (ref 100–199)
HDL: 66 mg/dL (ref >39)
LDL Chol Calc (NIH): 189 mg/dL — ABNORMAL HIGH (ref 0–99)
Triglycerides: 128 mg/dL (ref 0–149)
VLDL Cholesterol Cal: 23 mg/dL (ref 5–40)

## 2020-06-05 LAB — TSH: TSH: 2.88 u[IU]/mL (ref 0.450–4.500)

## 2020-06-17 ENCOUNTER — Other Ambulatory Visit: Payer: Self-pay | Admitting: Internal Medicine

## 2020-06-17 DIAGNOSIS — J439 Emphysema, unspecified: Secondary | ICD-10-CM

## 2020-06-24 DIAGNOSIS — M7551 Bursitis of right shoulder: Secondary | ICD-10-CM | POA: Diagnosis not present

## 2020-06-24 DIAGNOSIS — M19012 Primary osteoarthritis, left shoulder: Secondary | ICD-10-CM | POA: Diagnosis not present

## 2020-06-24 DIAGNOSIS — M19011 Primary osteoarthritis, right shoulder: Secondary | ICD-10-CM | POA: Diagnosis not present

## 2020-06-24 DIAGNOSIS — R600 Localized edema: Secondary | ICD-10-CM | POA: Diagnosis not present

## 2020-07-08 ENCOUNTER — Other Ambulatory Visit: Payer: Self-pay | Admitting: Internal Medicine

## 2020-07-08 ENCOUNTER — Telehealth: Payer: Self-pay

## 2020-07-08 DIAGNOSIS — J439 Emphysema, unspecified: Secondary | ICD-10-CM

## 2020-07-08 MED ORDER — TRELEGY ELLIPTA 100-62.5-25 MCG/INH IN AEPB: 1.0000 | INHALATION_SPRAY | Freq: Every day | RESPIRATORY_TRACT | 5 refills | Status: DC

## 2020-07-08 NOTE — Telephone Encounter (Signed)
Please Advise.  KP

## 2020-07-08 NOTE — Telephone Encounter (Unsigned)
Copied from [place] [phone]. Topic: General - Other >> Jul 08, 2020 10:21 AM Keene Breath wrote: Reason for CRM: Patient called to inform the nurse the samples of Trilogy that the doctor gave her are working very well.  She would like a script sent to her pharmacy as soon as possible because she is all out of the samples.  Please advise and call patient if needed to discuss at [phone]

## 2020-07-15 ENCOUNTER — Telehealth: Payer: Self-pay

## 2020-07-15 NOTE — Telephone Encounter (Signed)
Copied from [place] [phone]. Topic: General - Other >> Jul 15, 2020  2:21 PM Rainey Pines A wrote: Patient is requesting a callback today in regards to a coding error on a previous visit. Please advise

## 2020-07-16 NOTE — Telephone Encounter (Signed)
Pts response.  KP

## 2020-12-26 DIAGNOSIS — M5441 Lumbago with sciatica, right side: Secondary | ICD-10-CM | POA: Diagnosis not present

## 2020-12-26 DIAGNOSIS — M5416 Radiculopathy, lumbar region: Secondary | ICD-10-CM | POA: Diagnosis not present

## 2020-12-26 DIAGNOSIS — M48061 Spinal stenosis, lumbar region without neurogenic claudication: Secondary | ICD-10-CM | POA: Diagnosis not present

## 2020-12-26 DIAGNOSIS — G8929 Other chronic pain: Secondary | ICD-10-CM | POA: Diagnosis not present

## 2020-12-26 DIAGNOSIS — M5442 Lumbago with sciatica, left side: Secondary | ICD-10-CM | POA: Diagnosis not present

## 2021-01-22 ENCOUNTER — Ambulatory Visit: Payer: Medicare HMO | Admitting: Internal Medicine

## 2021-01-29 ENCOUNTER — Telehealth: Payer: Self-pay | Admitting: Internal Medicine

## 2021-01-29 NOTE — Telephone Encounter (Signed)
Copied from [place] [id number]. Topic: Referral - Request for Referral >> Jan 29, 2021  2:10 PM Leward Quan A wrote: Has patient seen PCP for this complaint? Yes.   *If NO, is insurance requiring patient see PCP for this issue before PCP can refer them? Referral for which specialty: Cardiologist Preferred provider/office: [facility]. Ubaldo Glassing, MD  Reason for referral: check heart function

## 2021-01-29 NOTE — Telephone Encounter (Signed)
Spoke with patient who states she needs a referral to cardiology due to right foot swelling.  Please schedule an appointment with Dr. Army Melia for further evaluation.  Tried to transfer call over and accidentally dropped call.

## 2021-01-29 NOTE — Telephone Encounter (Signed)
Pt scheduled for 02/04/21

## 2021-02-04 ENCOUNTER — Other Ambulatory Visit: Payer: Self-pay

## 2021-02-04 ENCOUNTER — Encounter: Payer: Self-pay | Admitting: Internal Medicine

## 2021-02-04 ENCOUNTER — Ambulatory Visit (INDEPENDENT_AMBULATORY_CARE_PROVIDER_SITE_OTHER): Payer: Medicare HMO | Admitting: Internal Medicine

## 2021-02-04 VITALS — BP 142/76 | HR 76 | Temp 97.7°F | Ht 62.0 in | Wt 179.0 lb

## 2021-02-04 DIAGNOSIS — M25472 Effusion, left ankle: Secondary | ICD-10-CM | POA: Diagnosis not present

## 2021-02-04 DIAGNOSIS — M25471 Effusion, right ankle: Secondary | ICD-10-CM | POA: Diagnosis not present

## 2021-02-04 DIAGNOSIS — R7989 Other specified abnormal findings of blood chemistry: Secondary | ICD-10-CM | POA: Diagnosis not present

## 2021-02-04 MED ORDER — TRIAMTERENE-HCTZ 37.5-25 MG PO TABS: 1.0000 | ORAL_TABLET | Freq: Every day | ORAL | 1 refills | Status: DC

## 2021-02-04 NOTE — Progress Notes (Signed)
Date:  02/04/2021   Name:  Monica Campbell   DOB:  [birth year]   MRN:  [id number]    Chief Complaint: Foot Swelling (X1-2 years on and off, Right foot, goes down in the mornings, does not swell all the times )  HPI Edema - long hx of intermittent ankle swelling that resolves over night.  Worse recently on the right. Over the weekend her calf seemed swollen as well.  She denies SOB, CP, change in medications/diet.  Her BP has been slightly elevated recently.  Lab Results  Component Value Date   CREATININE 0.79 06/04/2020   BUN 11 06/04/2020   NA 140 06/04/2020   K 4.0 06/04/2020   CL 101 06/04/2020   CO2 25 06/04/2020   Lab Results  Component Value Date   CHOL 278 (H) 06/04/2020   HDL 66 06/04/2020   LDLCALC 189 (H) 06/04/2020   TRIG 128 06/04/2020   CHOLHDL 4.2 06/04/2020   Lab Results  Component Value Date   TSH 2.880 06/04/2020   No results found for: HGBA1C Lab Results  Component Value Date   WBC 10.1 06/04/2020   HGB 13.0 06/04/2020   HCT 39.3 06/04/2020   MCV 89 06/04/2020   PLT 262 06/04/2020   Lab Results  Component Value Date   ALT 9 06/04/2020   AST 13 06/04/2020   ALKPHOS 61 06/04/2020   BILITOT 0.5 06/04/2020     Review of Systems  Constitutional: Negative for chills, fatigue and fever.  Respiratory: Negative for cough, chest tightness, shortness of breath and wheezing.   Cardiovascular: Positive for leg swelling. Negative for chest pain and palpitations.  Musculoskeletal: Positive for arthralgias, back pain and gait problem.  Psychiatric/Behavioral: Negative for dysphoric mood. The patient is not nervous/anxious.     Patient Active Problem List   Diagnosis Date Noted  . Low back pain due to bilateral sciatica 06/04/2020  . Ankle edema, bilateral 06/04/2020  . Senile ecchymosis 06/04/2020  . Arthritis of both acromioclavicular joints 09/05/2019  . Bursitis of right shoulder 07/05/2019  . Venous insufficiency of right leg 06/21/2019  .  Myalgia 06/21/2019  . Vitamin D deficiency 06/21/2019  . Carpal tunnel syndrome 04/10/2019  . Acquired trigger finger 04/03/2019  . Numbness of hand 04/03/2019  . Foot pain, left 12/17/2017  . Elevated TSH 05/03/2017  . Osteoporosis 03/24/2016  . Chronic dermatitis 07/09/2015  . Allergic rhinitis, seasonal 12/12/2014  . Gastro-esophageal reflux disease without esophagitis 12/12/2014  . Mixed hyperlipidemia 12/12/2014  . Degenerative arthritis of hip 12/12/2014  . Chronic obstructive pulmonary disease, unspecified ([place]) 10/11/2014  . Bilateral hearing loss 07/15/2014    Allergies  Allergen Reactions  . Augmentin [Amoxicillin-Pot Clavulanate] Itching    Itching all over.  . Codeine Rash    Past Surgical History:  Procedure Laterality Date  . CATARACT EXTRACTION W/PHACO Left 02/02/2018   Procedure: CATARACT EXTRACTION PHACO AND INTRAOCULAR LENS PLACEMENT ([place]) LEFT  MALYUGIN;  Surgeon: Leandrew Koyanagi, MD;  Location: [facility];  Service: Ophthalmology;  Laterality: Left;  . CATARACT EXTRACTION W/PHACO Right 05/03/2018   Procedure: CATARACT EXTRACTION PHACO AND INTRAOCULAR LENS PLACEMENT ([place]) RIGHT  IVA/TOPICAL;  Surgeon: Leandrew Koyanagi, MD;  Location: [facility];  Service: Ophthalmology;  Laterality: Right;  . MINOR HEMORRHOIDECTOMY      Social History   Tobacco Use  . Smoking status: Former Smoker    Packs/day: 0.50    Years: 30.00    Pack years: 15.00    Types:  Cigarettes    Quit date: 09/07/2000    Years since quitting: 20.4  . Smokeless tobacco: Never Used  . Tobacco comment: smoking cessation materials not required  Vaping Use  . Vaping Use: Never used  Substance Use Topics  . Alcohol use: Yes    Alcohol/week: 1.0 standard drink    Types: 1 Glasses of wine per week    Comment: social  . Drug use: No     Medication list has been reviewed and updated.  Current Meds  Medication Sig  . acetaminophen (TYLENOL) 650 MG CR tablet Take  650 mg by mouth every 8 (eight) hours as needed for pain.  Marland Kitchen albuterol (PROVENTIL) (2.5 MG/3ML) 0.083% nebulizer solution Take 3 mLs by nebulization every 6 (six) hours as needed for wheezing or shortness of breath.  Marland Kitchen albuterol (VENTOLIN HFA) 108 (90 Base) MCG/ACT inhaler INHALE 2 PUFFS INTO LUNGS EVERY 4 HOURS AS NEEDED FOR WHEEZING FOR SHORTNESS OF BREATH  . aspirin 81 MG chewable tablet Chew 1 tablet by mouth daily.  . Calcium Carbonate-Vit D-Min (CALTRATE 600+D PLUS MINERALS) 600-800 MG-UNIT CHEW Chew 1 tablet by mouth daily.  . Cholecalciferol (VITAMIN D) 125 MCG (5000 UT) CAPS Take 1 capsule by mouth daily.  Marland Kitchen docusate sodium (COLACE) 100 MG capsule Take 1 capsule by mouth daily.  . famotidine (PEPCID) 20 MG tablet Take 20 mg by mouth daily.  . fluticasone (FLONASE) 50 MCG/ACT nasal spray Use 2 spray(s) in each nostril once daily  . Fluticasone-Umeclidin-Vilant (TRELEGY ELLIPTA) 100-62.5-25 MCG/INH AEPB Inhale 1 puff into the lungs daily.  Marland Kitchen gabapentin (NEURONTIN) 100 MG capsule Take 2 capsules (200 mg total) by mouth at bedtime.    PHQ 2/9 Scores 02/04/2021 04/03/2020 01/24/2020 06/07/2019  PHQ - 2 Score 1 0 0 6  PHQ- 9 Score 8 - 0 12    GAD 7 : Generalized Anxiety Score 02/04/2021  Nervous, Anxious, on Edge 1  Control/stop worrying 1  Worry too much - different things 1  Trouble relaxing 0  Restless 0  Easily annoyed or irritable 0  Afraid - awful might happen 0  Total GAD 7 Score 3    BP Readings from Last 3 Encounters:  02/04/21 (!) 142/76  06/04/20 (!) 142/62  01/24/20 128/64    Physical Exam Vitals and nursing note reviewed.  Constitutional:      General: She is not in acute distress.    Appearance: Normal appearance. She is well-developed.  HENT:     Head: Normocephalic and atraumatic.  Cardiovascular:     Rate and Rhythm: Normal rate and regular rhythm.     Pulses: Normal pulses.     Heart sounds: No murmur heard.   Pulmonary:     Effort: Pulmonary effort is  normal. No respiratory distress.     Breath sounds: No wheezing or rhonchi.  Musculoskeletal:     Cervical back: Normal range of motion.     Right lower leg: 1+ Pitting Edema present.     Left lower leg: 1+ Pitting Edema present.  Lymphadenopathy:     Cervical: No cervical adenopathy.  Skin:    General: Skin is warm and dry.     Findings: No rash.  Neurological:     Mental Status: She is alert and oriented to person, place, and time.  Psychiatric:        Mood and Affect: Mood normal.        Behavior: Behavior normal.     Wt Readings from Last 3  Encounters:  02/04/21 179 lb (81.2 kg)  06/04/20 176 lb (79.8 kg)  01/24/20 178 lb (80.7 kg)    BP (!) 142/76   Pulse 76   Temp 97.7 F (36.5 C) (Oral)   Ht 5\' 2"  (1.575 m)   Wt 179 lb (81.2 kg)   SpO2 95%   BMI 32.74 kg/m   Assessment and Plan: 1. Ankle edema, bilateral With borderline elevated BP Begin Maxzide daily.  Limit sodium intake. Recheck in 4 months; screening labs today. - triamterene-hydrochlorothiazide (MAXZIDE-25) 37.5-25 MG tablet; Take 1 tablet by mouth daily.  Dispense: 90 tablet; Refill: 1 - TSH - Basic metabolic panel   Partially dictated using Editor, commissioning. Any errors are unintentional.  Halina Maidens, MD [facility]  02/04/2021

## 2021-02-05 LAB — BASIC METABOLIC PANEL
BUN/Creatinine Ratio: 14 (ref 12–28)
BUN: 11 mg/dL (ref 8–27)
CO2: 23 mmol/L (ref 20–29)
Calcium: 9.7 mg/dL (ref 8.7–10.3)
Chloride: 102 mmol/L (ref 96–106)
Creatinine, Ser: 0.76 mg/dL (ref 0.57–1.00)
Glucose: 86 mg/dL (ref 65–99)
Potassium: 3.9 mmol/L (ref 3.5–5.2)
Sodium: 142 mmol/L (ref 134–144)
eGFR: 80 mL/min/{1.73_m2} (ref >59)

## 2021-02-05 LAB — TSH: TSH: 3.88 u[IU]/mL (ref 0.450–4.500)

## 2021-02-28 DIAGNOSIS — Z0100 Encounter for examination of eyes and vision without abnormal findings: Secondary | ICD-10-CM | POA: Diagnosis not present

## 2021-02-28 DIAGNOSIS — Z961 Presence of intraocular lens: Secondary | ICD-10-CM | POA: Diagnosis not present

## 2021-03-17 DIAGNOSIS — M25551 Pain in right hip: Secondary | ICD-10-CM | POA: Diagnosis not present

## 2021-03-17 DIAGNOSIS — G8929 Other chronic pain: Secondary | ICD-10-CM | POA: Diagnosis not present

## 2021-03-17 DIAGNOSIS — M1611 Unilateral primary osteoarthritis, right hip: Secondary | ICD-10-CM | POA: Diagnosis not present

## 2021-03-18 ENCOUNTER — Other Ambulatory Visit: Payer: Self-pay | Admitting: Internal Medicine

## 2021-03-18 DIAGNOSIS — Z1231 Encounter for screening mammogram for malignant neoplasm of breast: Secondary | ICD-10-CM

## 2021-03-27 ENCOUNTER — Other Ambulatory Visit: Payer: Self-pay | Admitting: Internal Medicine

## 2021-03-27 DIAGNOSIS — M5442 Lumbago with sciatica, left side: Secondary | ICD-10-CM

## 2021-03-27 DIAGNOSIS — M5441 Lumbago with sciatica, right side: Secondary | ICD-10-CM

## 2021-03-31 DIAGNOSIS — M25551 Pain in right hip: Secondary | ICD-10-CM | POA: Diagnosis not present

## 2021-03-31 DIAGNOSIS — M48062 Spinal stenosis, lumbar region with neurogenic claudication: Secondary | ICD-10-CM | POA: Diagnosis not present

## 2021-03-31 DIAGNOSIS — M5441 Lumbago with sciatica, right side: Secondary | ICD-10-CM | POA: Diagnosis not present

## 2021-03-31 DIAGNOSIS — G8929 Other chronic pain: Secondary | ICD-10-CM | POA: Diagnosis not present

## 2021-03-31 DIAGNOSIS — M1611 Unilateral primary osteoarthritis, right hip: Secondary | ICD-10-CM | POA: Diagnosis not present

## 2021-04-07 ENCOUNTER — Ambulatory Visit: Payer: Medicare HMO

## 2021-04-21 ENCOUNTER — Telehealth: Payer: Self-pay

## 2021-04-21 ENCOUNTER — Other Ambulatory Visit: Payer: Self-pay

## 2021-04-21 ENCOUNTER — Telehealth: Payer: Self-pay | Admitting: Internal Medicine

## 2021-04-21 NOTE — Telephone Encounter (Signed)
Please review.  KP

## 2021-04-21 NOTE — Telephone Encounter (Signed)
Copied from [place] [id number]. Topic: Referral - Request for Referral >> Apr 21, 2021  2:15 PM Leward Quan A wrote: Has patient seen PCP for this complaint? Yes.   *If NO, is insurance requiring patient see PCP for this issue before PCP can refer them? Referral for which specialty: Ortho  Preferred provider/office: Dr Posey Pronto  Reason for referral: Shoulder pain

## 2021-04-21 NOTE — Telephone Encounter (Signed)
Called pt left VM that orders was placed on 7/12. Pts name was stated on VM.  KP

## 2021-04-21 NOTE — Telephone Encounter (Signed)
Copied from [place] [phone]. Topic: General - Other >> Apr 21, 2021  2:26 PM Leward Quan A wrote: Reason for CRM: Patient called in to inform Dr Army Melia that she have an appointment on Thursday 04/24/21 for her mammogram but that they need a referral per her insurance and patient stated that she just found this out today. Also have a Dermatologist appointment set for 06/19/21 with Dr Phillip Heal. Please advise

## 2021-04-22 NOTE — Telephone Encounter (Signed)
Called pt left VM to see if she wanted to see Dr. Zigmund Daniel for her shoulder pain. Told pt to call back so we can schedule an appt with him.  KP

## 2021-04-23 ENCOUNTER — Ambulatory Visit (INDEPENDENT_AMBULATORY_CARE_PROVIDER_SITE_OTHER): Payer: Medicare HMO

## 2021-04-23 DIAGNOSIS — Z0000 Encounter for general adult medical examination without abnormal findings: Secondary | ICD-10-CM

## 2021-04-23 NOTE — Telephone Encounter (Signed)
AWV done today. Patient states she would prefer to Dr. Ephriam Jenkins for shoulder and needs new referral for insurance. Also needs referral to Dr. Dierdre Searles for dermatology. See nurse notes in AWV for details.

## 2021-04-23 NOTE — Progress Notes (Signed)
Subjective:   Monica Campbell is a 80 y.o. female who presents for Medicare Annual (Subsequent) preventive examination.  Virtual Visit via Telephone Note  I connected with  Monica Campbell on 04/23/21 at  3:20 PM EDT by telephone and verified that I am speaking with the correct person using two identifiers.  Location: Patient: home Provider: [facility] Persons participating in the virtual visit: [place]   I discussed the limitations, risks, security and privacy concerns of performing an evaluation and management service by telephone and the availability of in person appointments. The patient expressed understanding and agreed to proceed.  Interactive audio and video telecommunications were attempted between this nurse and patient, however failed, due to patient having technical difficulties OR patient did not have access to video capability.  We continued and completed visit with audio only.  Some vital signs may be absent or patient reported.   Clemetine Marker, LPN   Review of Systems    Cardiac Risk Factors include: advanced age (>39mn, >>60women);dyslipidemia;obesity (BMI >30kg/m2)     Objective:    There were no vitals filed for this visit. There is no height or weight on file to calculate BMI.  Advanced Directives 04/23/2021 04/03/2020 04/03/2019 05/03/2018 03/23/2018 02/02/2018 07/05/2017  Does Patient Have a Medical Advance Directive? Yes No Yes No Yes No Yes  Type of AParamedicof ADixieLiving will - [place] will - [place] will - -  Does patient want to make changes to medical advance directive? - - No - Patient declined - - - -  Copy of [place] Chart? No - copy requested - No - copy requested - No - copy requested - -  Would patient like information on creating a medical advance directive? - No - Patient declined - No - Patient declined - No - Patient  declined -    Current Medications (verified) Outpatient Encounter Medications as of 04/23/2021  Medication Sig   acetaminophen (TYLENOL) 650 MG CR tablet Take 650 mg by mouth every 8 (eight) hours as needed for pain.   albuterol (PROVENTIL) (2.5 MG/3ML) 0.083% nebulizer solution Take 3 mLs by nebulization every 6 (six) hours as needed for wheezing or shortness of breath.   albuterol (VENTOLIN HFA) 108 (90 Base) MCG/ACT inhaler INHALE 2 PUFFS INTO LUNGS EVERY 4 HOURS AS NEEDED FOR WHEEZING FOR SHORTNESS OF BREATH   aspirin 81 MG chewable tablet Chew 1 tablet by mouth daily.   Calcium Carbonate-Vit D-Min (CALTRATE 600+D PLUS MINERALS) 600-800 MG-UNIT CHEW Chew 1 tablet by mouth daily.   Cholecalciferol (VITAMIN D) 125 MCG (5000 UT) CAPS Take 1 capsule by mouth daily.   docusate sodium (COLACE) 100 MG capsule Take 1 capsule by mouth daily.   famotidine (PEPCID) 20 MG tablet Take 20 mg by mouth daily.   fluticasone (FLONASE) 50 MCG/ACT nasal spray Use 2 spray(s) in each nostril once daily   Fluticasone-Umeclidin-Vilant (TRELEGY ELLIPTA) 100-62.5-25 MCG/INH AEPB Inhale 1 puff into the lungs daily.   gabapentin (NEURONTIN) 100 MG capsule TAKE 2 CAPSULES BY MOUTH AT BEDTIME   Multiple Vitamin (MULTIVITAMIN ADULT PO) Take by mouth.   triamterene-hydrochlorothiazide (MAXZIDE-25) 37.5-25 MG tablet Take 1 tablet by mouth daily.   No facility-administered encounter medications on file as of 04/23/2021.    Allergies (verified) Augmentin [amoxicillin-pot clavulanate] and Codeine   History: Past Medical History:  Diagnosis Date   COPD (chronic obstructive pulmonary disease) ([place]  Dyspnea    GERD (gastroesophageal reflux disease)    Hard of hearing    Hypercholesteremia    Osteoarthritis    Seasonal allergies    Past Surgical History:  Procedure Laterality Date   CATARACT EXTRACTION W/PHACO Left 02/02/2018   Procedure: CATARACT EXTRACTION PHACO AND INTRAOCULAR LENS PLACEMENT ([place]) LEFT   MALYUGIN;  Surgeon: Leandrew Koyanagi, MD;  Location: [place];  Service: Ophthalmology;  Laterality: Left;   CATARACT EXTRACTION W/PHACO Right 05/03/2018   Procedure: CATARACT EXTRACTION PHACO AND INTRAOCULAR LENS PLACEMENT ([place]) RIGHT  IVA/TOPICAL;  Surgeon: Leandrew Koyanagi, MD;  Location: [facility];  Service: Ophthalmology;  Laterality: Right;   MINOR HEMORRHOIDECTOMY     Family History  Problem Relation Age of Onset   Heart disease Mother    Heart disease Sister    Heart disease Brother    Cancer Father        colon   Breast cancer Neg Hx    Social History   Socioeconomic History   Marital status: Widowed    Spouse name: Not on file   Number of children: 3   Years of education: Not on file   Highest education level: 12th grade  Occupational History   Occupation: Retired  Tobacco Use   Smoking status: Former    Packs/day: 0.50    Years: 30.00    Pack years: 15.00    Types: Cigarettes    Quit date: 09/07/2000    Years since quitting: 20.6   Smokeless tobacco: Never   Tobacco comments:    smoking cessation materials not required  Vaping Use   Vaping Use: Never used  Substance and Sexual Activity   Alcohol use: Yes    Alcohol/week: 1.0 standard drink    Types: 1 Glasses of wine per week    Comment: social   Drug use: No   Sexual activity: Not Currently  Other Topics Concern   Not on file  Social History Narrative   Pt lives alone    Social Determinants of Health   Financial Resource Strain: Low Risk    Difficulty of Paying Living Expenses: Not hard at all  Food Insecurity: No Food Insecurity   Worried About Charity fundraiser in the Last Year: Never true   Arboriculturist in the Last Year: Never true  Transportation Needs: No Transportation Needs   Lack of Transportation (Medical): No   Lack of Transportation (Non-Medical): No  Physical Activity: Insufficiently Active   Days of Exercise per Week: 7 days   Minutes of Exercise per  Session: 20 min  Stress: No Stress Concern Present   Feeling of Stress : Only a little  Social Connections: Socially Isolated   Frequency of Communication with Friends and Family: More than three times a week   Frequency of Social Gatherings with Friends and Family: Twice a week   Attends Religious Services: Never   Marine scientist or Organizations: No   Attends Archivist Meetings: Never   Marital Status: Widowed    Tobacco Counseling Counseling given: Not Answered Tobacco comments: smoking cessation materials not required   Clinical Intake:  Pre-visit preparation completed: Yes  Pain : No/denies pain     Nutritional Risks: None Diabetes: No  How often do you need to have someone help you when you read instructions, pamphlets, or other written materials from your doctor or pharmacy?: 1 - Never    Interpreter Needed?: No  Information entered by :: [employer]  Shray Hunley LPN   Activities of Daily Living In your present state of health, do you have any difficulty performing the following activities: 04/23/2021 02/04/2021  Hearing? Tempie Donning  Comment wears hearing aids -  Vision? N N  Difficulty concentrating or making decisions? N N  Walking or climbing stairs? N N  Dressing or bathing? N N  Doing errands, shopping? N N  Preparing Food and eating ? N -  Using the Toilet? N -  In the past six months, have you accidently leaked urine? Y -  Comment wears pads for protection -  Do you have problems with loss of bowel control? N -  Managing your Medications? N -  Managing your Finances? N -  Housekeeping or managing your Housekeeping? N -  Some recent data might be hidden    Patient Care Team: Glean Hess, MD as PCP - General (Internal Medicine) Leandrew Koyanagi, MD as Referring Physician (Ophthalmology) Doyle Askew, MD as Referring Physician (Physical Medicine and Rehabilitation) Quintin Alto, MD as Consulting Physician  (Rheumatology) Jannet Mantis, MD (Dermatology)  Indicate any recent Medical Services you may have received from other than [facility] providers in the past year (date may be approximate).     Assessment:   This is a routine wellness examination for [place].  Hearing/Vision screen Hearing Screening - Comments:: Pt wears hearing aids maintained by [facility] in [place]:: [facility] screen[facility] Dr. Wallace Going  Dietary issues and exercise activities discussed: Current Exercise Habits: Home exercise routine, Type of exercise: stretching;calisthenics, Time (Minutes): 20, Frequency (Times/Week): 7, Weekly Exercise (Minutes/Week): 140, Intensity: Mild, Exercise limited by: None identified   Goals Addressed   None    Depression Screen PHQ 2/9 Scores 04/23/2021 02/04/2021 04/03/2020 01/24/2020 06/07/2019 04/03/2019 03/28/2019  PHQ - 2 Score 0 1 0 0 '6 1 2  '$ PHQ- 9 Score - 8 - 0 '12 5 6    '$ Fall Risk Fall Risk  04/23/2021 02/04/2021 04/03/2020 01/24/2020 04/03/2019  Falls in the past year? 0 0 0 1 0  Comment - - - - -  Number falls in past yr: 0 - 0 1 0  Injury with Fall? 0 - 0 0 0  Comment - - - - -  Risk Factor Category  - - - - -  Comment - - - - -  Risk for fall due to : No Fall Risks - Orthopedic patient History of fall(s);Impaired balance/gait -  Risk for fall due to: Comment - - - - -  Follow up Falls prevention discussed Falls evaluation completed Falls prevention discussed Falls evaluation completed Falls prevention discussed    FALL RISK PREVENTION PERTAINING TO THE HOME:  Any stairs in or around the home? Yes  If so, are there any without handrails? No  Home free of loose throw rugs in walkways, pet beds, electrical cords, etc? Yes  Adequate lighting in your home to reduce risk of falls? Yes   ASSISTIVE DEVICES UTILIZED TO PREVENT FALLS:  Life alert? No  Use of a cane, walker or w/c? No  Grab bars in the bathroom? Yes   Shower chair or bench in shower? No  Elevated toilet seat or a handicapped toilet? No   TIMED UP AND GO:  Was the test performed? No . Telephonic visit  Cognitive Function: Normal cognitive status assessed by direct observation by this Nurse Health Advisor. No abnormalities found.       6CIT Screen  04/03/2020 04/03/2019 03/23/2018 03/15/2017 03/15/2017  What Year? 0 points 0 points 0 points 0 points 0 points  What month? 0 points 0 points 0 points 0 points 0 points  What time? 0 points 0 points 0 points 0 points -  Count back from 20 0 points 0 points 0 points 0 points -  Months in reverse 0 points 0 points 2 points 0 points -  Repeat phrase 0 points 0 points 2 points 2 points -  Total Score 0 0 4 2 -    Immunizations Immunization History  Administered Date(s) Administered   Fluad Quad(high Dose 65+) 06/04/2020   Influenza, High Dose Seasonal PF 06/09/2017, 05/12/2018, 05/10/2019   Influenza-Unspecified 05/18/2012, 07/24/2015   PFIZER Comirnaty(Gray Top)Covid-19 Tri-Sucrose Vaccine 03/28/2021   PFIZER(Purple Top)SARS-COV-2 Vaccination 11/06/2019, 11/26/2019, 07/03/2020   Pneumococcal Conjugate-13 01/17/2014   Pneumococcal Polysaccharide-23 02/06/2004, 02/20/2007, 12/16/2011   Tdap 05/11/2014   Zoster, Live 02/04/2010    TDAP status: Up to date  Flu Vaccine status: Up to date  Pneumococcal vaccine status: Up to date  Covid-19 vaccine status: Completed vaccines  Qualifies for Shingles Vaccine? Yes   Zostavax completed Yes   Shingrix Completed?: No.    Education has been provided regarding the importance of this vaccine. Patient has been advised to call insurance company to determine out of pocket expense if they have not yet received this vaccine. Advised may also receive vaccine at local pharmacy or Health Dept. Verbalized acceptance and understanding.  Screening Tests Health Maintenance  Topic Date Due   Zoster Vaccines- Shingrix (1 of 2) Never done   MAMMOGRAM   04/23/2021   INFLUENZA VACCINE  04/07/2021   TETANUS/TDAP  05/11/2024   DEXA SCAN  Completed   COVID-19 Vaccine  Completed   Hepatitis C Screening  Completed   PNA vac Low Risk Adult  Completed   HPV VACCINES  Aged Out    Health Maintenance  Health Maintenance Due  Topic Date Due   Zoster Vaccines- Shingrix (1 of 2) Never done   MAMMOGRAM  04/23/2021   INFLUENZA VACCINE  04/07/2021    Colorectal cancer screening: No longer required.   Mammogram status: Completed 04/23/20. Repeat every year  Bone Density status: Completed 04/14/18. Results reflect: Bone density results: OSTEOPOROSIS. Repeat every as directed years.  Lung Cancer Screening: (Low Dose CT Chest recommended if Age 75-80 years, 30 pack-year currently smoking OR have quit w/in 15years.) does not qualify.   Additional Screening:  Hepatitis C Screening: does qualify; Completed 06/04/20  Vision Screening: Recommended annual ophthalmology exams for early detection of glaucoma and other disorders of the eye. Is the patient up to date with their annual eye exam?  Yes  Who is the provider or what is the name of the office in which the patient attends annual eye exams? Dr. Wallace Going.   Dental Screening: Recommended annual dental exams for proper oral hygiene  Community Resource Referral / Chronic Care Management: CRR required this visit?  No   CCM required this visit?  No      Plan:     I have personally reviewed and noted the following in the patient's chart:   Medical and social history Use of alcohol, tobacco or illicit drugs  Current medications and supplements including opioid prescriptions.  Functional ability and status Nutritional status Physical activity Advanced directives List of other physicians Hospitalizations, surgeries, and ER visits in previous 12 months Vitals Screenings to include cognitive, depression, and falls Referrals and appointments  In addition, I  have reviewed and discussed with  patient certain preventive protocols, quality metrics, and best practice recommendations. A written personalized care plan for preventive services as well as general preventive health recommendations were provided to patient.     Clemetine Marker, LPN   [id number]   Nurse Notes: pt states she needs renewed referrals to Dr. Posey Pronto for bursitis in her shoulder; appt scheduled for 05/19/21. Also dermatology referral to Dr. Dierdre Searles; scheduled for 06/19/20. Pt states she also needs a stress test because she hasn't had one since 2016; advised to discuss at [facility]PE on 06/06/21.

## 2021-04-23 NOTE — Telephone Encounter (Signed)
Called pt on both numbers listed. Pt needs an appt for shoulder pain before Dr. Army Melia can place a referral. Pt has not been seem for shoulder pain this year.  KP

## 2021-04-23 NOTE — Patient Instructions (Signed)
Monica Campbell , Thank you for taking time to come for your Medicare Wellness Visit. I appreciate your ongoing commitment to your health goals. Please review the following plan we discussed and let me know if I can assist you in the future.   Screening recommendations/referrals: Colonoscopy: no longer required Mammogram: done 04/23/20. Scheduled for 04/24/21 Bone Density: done 04/14/18 Recommended yearly ophthalmology/optometry visit for glaucoma screening and checkup Recommended yearly dental visit for hygiene and checkup  Vaccinations: Influenza vaccine: done 06/04/20 Pneumococcal vaccine: done 01/17/14 Tdap vaccine: done 05/17/14 Shingles vaccine: Shingrix discussed. Please contact your pharmacy for coverage information.  Covid-19:done 11/06/19, 11/26/19, 07/03/20 & 03/28/21  Advanced directives: Please bring a copy of your health care power of attorney and living will to the office at your convenience.   Conditions/risks identified: Keep up the great work!  Next appointment: Follow up in one year for your annual wellness visit    Preventive Care 65 Years and Older, Female Preventive care refers to lifestyle choices and visits with your health care provider that can promote health and wellness. What does preventive care include? A yearly physical exam. This is also called an annual well check. Dental exams once or twice a year. Routine eye exams. Ask your health care provider how often you should have your eyes checked. Personal lifestyle choices, including: Daily care of your teeth and gums. Regular physical activity. Eating a healthy diet. Avoiding tobacco and drug use. Limiting alcohol use. Practicing safe sex. Taking low-dose aspirin every day. Taking vitamin and mineral supplements as recommended by your health care provider. What happens during an annual well check? The services and screenings done by your health care provider during your annual well check will depend on your age,  overall health, lifestyle risk factors, and family history of disease. Counseling  Your health care provider may ask you questions about your: Alcohol use. Tobacco use. Drug use. Emotional well-being. Home and relationship well-being. Sexual activity. Eating habits. History of falls. Memory and ability to understand (cognition). Work and work Statistician. Reproductive health. Screening  You may have the following tests or measurements: Height, weight, and BMI. Blood pressure. Lipid and cholesterol levels. These may be checked every 5 years, or more frequently if you are over 4 years old. Skin check. Lung cancer screening. You may have this screening every year starting at age 32 if you have a 30-pack-year history of smoking and currently smoke or have quit within the past 15 years. Fecal occult blood test (FOBT) of the stool. You may have this test every year starting at age 15. Flexible sigmoidoscopy or colonoscopy. You may have a sigmoidoscopy every 5 years or a colonoscopy every 10 years starting at age 64. Hepatitis C blood test. Hepatitis B blood test. Sexually transmitted disease (STD) testing. Diabetes screening. This is done by checking your blood sugar (glucose) after you have not eaten for a while (fasting). You may have this done every 1-3 years. Bone density scan. This is done to screen for osteoporosis. You may have this done starting at age 30. Mammogram. This may be done every 1-2 years. Talk to your health care provider about how often you should have regular mammograms. Talk with your health care provider about your test results, treatment options, and if necessary, the need for more tests. Vaccines  Your health care provider may recommend certain vaccines, such as: Influenza vaccine. This is recommended every year. Tetanus, diphtheria, and acellular pertussis (Tdap, Td) vaccine. You may need a Td booster every  10 years. Zoster vaccine. You may need this after age  23. Pneumococcal 13-valent conjugate (PCV13) vaccine. One dose is recommended after age 21. Pneumococcal polysaccharide (PPSV23) vaccine. One dose is recommended after age 51. Talk to your health care provider about which screenings and vaccines you need and how often you need them. This information is not intended to replace advice given to you by your health care provider. Make sure you discuss any questions you have with your health care provider. Document Released: 09/20/2015 Document Revised: 05/13/2016 Document Reviewed: 06/25/2015 Elsevier Interactive Patient Education  2017 [place] Prevention in the Home Falls can cause injuries. They can happen to people of all ages. There are many things you can do to make your home safe and to help prevent falls. What can I do on the outside of my home? Regularly fix the edges of walkways and driveways and fix any cracks. Remove anything that might make you trip as you walk through a door, such as a raised step or threshold. Trim any bushes or trees on the path to your home. Use bright outdoor lighting. Clear any walking paths of anything that might make someone trip, such as rocks or tools. Regularly check to see if handrails are loose or broken. Make sure that both sides of any steps have handrails. Any raised decks and porches should have guardrails on the edges. Have any leaves, snow, or ice cleared regularly. Use sand or salt on walking paths during winter. Clean up any spills in your garage right away. This includes oil or grease spills. What can I do in the bathroom? Use night lights. Install grab bars by the toilet and in the tub and shower. Do not use towel bars as grab bars. Use non-skid mats or decals in the tub or shower. If you need to sit down in the shower, use a plastic, non-slip stool. Keep the floor dry. Clean up any water that spills on the floor as soon as it happens. Remove soap buildup in the tub or shower  regularly. Attach bath mats securely with double-sided non-slip rug tape. Do not have throw rugs and other things on the floor that can make you trip. What can I do in the bedroom? Use night lights. Make sure that you have a light by your bed that is easy to reach. Do not use any sheets or blankets that are too big for your bed. They should not hang down onto the floor. Have a firm chair that has side arms. You can use this for support while you get dressed. Do not have throw rugs and other things on the floor that can make you trip. What can I do in the kitchen? Clean up any spills right away. Avoid walking on wet floors. Keep items that you use a lot in easy-to-reach places. If you need to reach something above you, use a strong step stool that has a grab bar. Keep electrical cords out of the way. Do not use floor polish or wax that makes floors slippery. If you must use wax, use non-skid floor wax. Do not have throw rugs and other things on the floor that can make you trip. What can I do with my stairs? Do not leave any items on the stairs. Make sure that there are handrails on both sides of the stairs and use them. Fix handrails that are broken or loose. Make sure that handrails are as long as the stairways. Check any carpeting to make  sure that it is firmly attached to the stairs. Fix any carpet that is loose or worn. Avoid having throw rugs at the top or bottom of the stairs. If you do have throw rugs, attach them to the floor with carpet tape. Make sure that you have a light switch at the top of the stairs and the bottom of the stairs. If you do not have them, ask someone to add them for you. What else can I do to help prevent falls? Wear shoes that: Do not have high heels. Have rubber bottoms. Are comfortable and fit you well. Are closed at the toe. Do not wear sandals. If you use a stepladder: Make sure that it is fully opened. Do not climb a closed stepladder. Make sure that  both sides of the stepladder are locked into place. Ask someone to hold it for you, if possible. Clearly mark and make sure that you can see: Any grab bars or handrails. First and last steps. Where the edge of each step is. Use tools that help you move around (mobility aids) if they are needed. These include: Canes. Walkers. Scooters. Crutches. Turn on the lights when you go into a dark area. Replace any light bulbs as soon as they burn out. Set up your furniture so you have a clear path. Avoid moving your furniture around. If any of your floors are uneven, fix them. If there are any pets around you, be aware of where they are. Review your medicines with your doctor. Some medicines can make you feel dizzy. This can increase your chance of falling. Ask your doctor what other things that you can do to help prevent falls. This information is not intended to replace advice given to you by your health care provider. Make sure you discuss any questions you have with your health care provider. Document Released: 06/20/2009 Document Revised: 01/30/2016 Document Reviewed: 09/28/2014 Elsevier Interactive Patient Education  2017 [employer].

## 2021-04-24 ENCOUNTER — Other Ambulatory Visit: Payer: Self-pay

## 2021-04-24 ENCOUNTER — Ambulatory Visit
Admission: RE | Admit: 2021-04-24 | Discharge: 2021-04-24 | Disposition: A | Discharge status: Home / Self Care | Payer: Medicare HMO | Source: Ambulatory Visit | Attending: Internal Medicine | Admitting: Internal Medicine

## 2021-04-24 DIAGNOSIS — Z1231 Encounter for screening mammogram for malignant neoplasm of breast: Secondary | ICD-10-CM | POA: Diagnosis not present

## 2021-04-30 ENCOUNTER — Telehealth: Payer: Self-pay

## 2021-04-30 NOTE — Telephone Encounter (Signed)
Patient called on both numbers listed, left VM on home number to return the call to the office, cell number mailbox is full per recording.

## 2021-04-30 NOTE — Telephone Encounter (Signed)
Called pt left VM to call back. Please read pt note from Dr. Army Melia.  PEC nurse may give results to patient if they return call to clinic, a CRM has been created.  KP

## 2021-04-30 NOTE — Telephone Encounter (Signed)
Patient returned call and advised of the message below from Dr. Army Melia. She says she doesn't understand why Dr. Army Melia can't just send a referral to Dr. Amalia Hailey so that she can see him on Friday as scheduled. She says when she made the appointment they told her she would need a referral entered in per her insurance, but they would go ahead and schedule so that she can have the appointment, otherwise it would be a month out. She says she needs to see him because of her legs and feet and that Dr. Army Melia has seen her in the past for this. I offered an appointment with Dr. Zigmund Daniel tomorrow, but she refused at this time saying she will call her insurance company and call us back if they say she needs the appointment.

## 2021-04-30 NOTE — Telephone Encounter (Signed)
Copied from [place] [phone]. Topic: Referral - Request for Referral >> Apr 29, 2021  4:54 PM Erick Blinks wrote: Has patient seen PCP for this complaint? Yes.   *If NO, is insurance requiring patient see PCP for this issue before PCP can refer them? Referral for which specialty: Podiatry Preferred provider/office: Dr. Ellard Artis from [facility] in [place]  Reason for referral: Having foot, leg, and hip problems. Trying to determine what the cause of the issues is.   Dr. Ellard Artis has a slot available this Friday at 10:30 am, she is requesting for the office to get this referral finished in time

## 2021-04-30 NOTE — Telephone Encounter (Signed)
Please review.  KP

## 2021-05-01 ENCOUNTER — Telehealth: Payer: Self-pay

## 2021-05-01 ENCOUNTER — Other Ambulatory Visit: Payer: Self-pay

## 2021-05-01 DIAGNOSIS — M25471 Effusion, right ankle: Secondary | ICD-10-CM

## 2021-05-01 NOTE — Telephone Encounter (Signed)
PEC called in to see if patient needs to be seen for podiatry referral. Explained yes, We have no reason in her chart to send her to podiatry so she will have to be seen for the problem first and then referral can be sent only if needed.

## 2021-05-01 NOTE — Telephone Encounter (Signed)
Copied from [place] [phone]. Topic: General - Other >> Apr 30, 2021  5:11 PM Pawlus, Brayton Layman A wrote: Reason for CRM: Pt stated she does not know why she needs to have an office visit to get a referral, pt stated her insurance company advised her that Dr Army Melia just needs to send a referral over and she does not need to be seen. Please advise.

## 2021-05-02 ENCOUNTER — Encounter: Payer: Self-pay | Admitting: Podiatry

## 2021-05-02 ENCOUNTER — Ambulatory Visit: Payer: Medicare HMO | Admitting: Podiatry

## 2021-05-02 ENCOUNTER — Other Ambulatory Visit: Payer: Self-pay

## 2021-05-02 ENCOUNTER — Ambulatory Visit (INDEPENDENT_AMBULATORY_CARE_PROVIDER_SITE_OTHER): Payer: Medicare HMO

## 2021-05-02 VITALS — BP 119/65 | HR 69 | Temp 97.0°F | Resp 16

## 2021-05-02 DIAGNOSIS — M7751 Other enthesopathy of right foot: Secondary | ICD-10-CM

## 2021-05-02 DIAGNOSIS — R609 Edema, unspecified: Secondary | ICD-10-CM

## 2021-05-02 DIAGNOSIS — R600 Localized edema: Secondary | ICD-10-CM

## 2021-05-02 MED ORDER — BETAMETHASONE SOD PHOS & ACET 6 (3-3) MG/ML IJ SUSP
3.0000 mg | Freq: Once | INTRAMUSCULAR | Status: AC
  Administered 2021-05-02: 3 mg via INTRA_ARTICULAR

## 2021-05-02 NOTE — Progress Notes (Signed)
   Subjective:  80 y.o. female presenting today for new complaint regarding pain and tenderness to the right ankle joint this been going on for few months now.  She denies a history of injury.  She says that slowly there is some increased swelling that she has noticed to the right ankle.  She does have history of sciatica and she is being treated for that.  She currently takes gabapentin 2 times daily as per her PCP.   Past Medical History:  Diagnosis Date   COPD (chronic obstructive pulmonary disease) (HCC)    Dyspnea    GERD (gastroesophageal reflux disease)    Hard of hearing    Hypercholesteremia    Osteoarthritis    Seasonal allergies      Objective / Physical Exam:  General:  The patient is alert and oriented x3 in no acute distress. Dermatology:  Skin is warm, dry and supple bilateral lower extremities. Negative for open lesions or macerations. Vascular:  Palpable pedal pulses bilaterally. No edema or erythema noted. Capillary refill within normal limits. Neurological:  Epicritic and protective threshold grossly intact bilaterally.  Musculoskeletal Exam:  Pain on palpation to the anterior lateral medial aspects of the patient's right ankle. Mild edema noted. Range of motion within normal limits to all pedal and ankle joints bilateral. Muscle strength 5/5 in all groups bilateral.   Radiographic Exam:  Normal osseous mineralization. Joint spaces preserved. No fracture/dislocation/boney destruction.    Assessment: 1.  Capsulitis right ankle  Plan of Care:  1. Patient was evaluated. X-Rays reviewed.  2. Injection of 0.5 mL Celestone Soluspan injected in the patient's right ankle. 3.  Compression ankle sleeve dispensed.  Wear daily 4.  Recommend good supportive shoes and sneakers 5.  Return to clinic in 4 weeks   Edrick Kins, DPM [facility]  Dr. Edrick Kins, DPM    [address], [place] [address]                 Office [phone]  Fax [phone]

## 2021-05-20 ENCOUNTER — Ambulatory Visit: Payer: Medicare HMO | Admitting: Internal Medicine

## 2021-06-06 ENCOUNTER — Ambulatory Visit (INDEPENDENT_AMBULATORY_CARE_PROVIDER_SITE_OTHER): Payer: Medicare HMO | Admitting: Internal Medicine

## 2021-06-06 ENCOUNTER — Other Ambulatory Visit: Payer: Self-pay

## 2021-06-06 ENCOUNTER — Encounter: Payer: Self-pay | Admitting: Internal Medicine

## 2021-06-06 VITALS — BP 122/78 | HR 78 | Ht 62.0 in | Wt 175.0 lb

## 2021-06-06 DIAGNOSIS — E782 Mixed hyperlipidemia: Secondary | ICD-10-CM | POA: Diagnosis not present

## 2021-06-06 DIAGNOSIS — M5441 Lumbago with sciatica, right side: Secondary | ICD-10-CM | POA: Diagnosis not present

## 2021-06-06 DIAGNOSIS — Z23 Encounter for immunization: Secondary | ICD-10-CM

## 2021-06-06 DIAGNOSIS — J439 Emphysema, unspecified: Secondary | ICD-10-CM | POA: Diagnosis not present

## 2021-06-06 DIAGNOSIS — M810 Age-related osteoporosis without current pathological fracture: Secondary | ICD-10-CM

## 2021-06-06 DIAGNOSIS — M25471 Effusion, right ankle: Secondary | ICD-10-CM | POA: Diagnosis not present

## 2021-06-06 DIAGNOSIS — R0602 Shortness of breath: Secondary | ICD-10-CM | POA: Diagnosis not present

## 2021-06-06 DIAGNOSIS — Z0000 Encounter for general adult medical examination without abnormal findings: Secondary | ICD-10-CM | POA: Diagnosis not present

## 2021-06-06 DIAGNOSIS — E559 Vitamin D deficiency, unspecified: Secondary | ICD-10-CM

## 2021-06-06 DIAGNOSIS — M5442 Lumbago with sciatica, left side: Secondary | ICD-10-CM

## 2021-06-06 DIAGNOSIS — M25472 Effusion, left ankle: Secondary | ICD-10-CM

## 2021-06-06 MED ORDER — ALBUTEROL SULFATE HFA 108 (90 BASE) MCG/ACT IN AERS: 2.0000 | INHALATION_SPRAY | Freq: Four times a day (QID) | RESPIRATORY_TRACT | 3 refills | Status: DC | PRN

## 2021-06-06 MED ORDER — TRIAMTERENE-HCTZ 37.5-25 MG PO TABS: 1.0000 | ORAL_TABLET | Freq: Every day | ORAL | 1 refills | Status: DC

## 2021-06-06 MED ORDER — TRELEGY ELLIPTA 100-62.5-25 MCG/INH IN AEPB: 1.0000 | INHALATION_SPRAY | Freq: Every day | RESPIRATORY_TRACT | 5 refills | Status: DC

## 2021-06-06 MED ORDER — GABAPENTIN 100 MG PO CAPS: 200.0000 mg | ORAL_CAPSULE | Freq: Every day | ORAL | 1 refills | Status: DC

## 2021-06-06 NOTE — Progress Notes (Signed)
Date:  06/06/2021   Name:  Monica Campbell   DOB:  03/19/1941   MRN:  [id number]   Chief Complaint: Annual Exam (Breast Exam, No pap. ) Monica Campbell is a 80 y.o. female who presents today for her Complete Annual Exam. She feels well. She reports exercising - walking, mowing the yard.  She reports she is sleeping fairly well. Breast complaints - none.  Mammogram: 04/2021 DEXA: 04/2018 osteoporosis hip/osteopenia spine Colonoscopy: 2013  Immunization History  Administered Date(s) Administered   Fluad Quad(high Dose 65+) 06/04/2020   Influenza, High Dose Seasonal PF 06/09/2017, 05/12/2018, 05/10/2019   Influenza-Unspecified 05/18/2012, 07/24/2015   PFIZER Comirnaty(Gray Top)Covid-19 Tri-Sucrose Vaccine 03/28/2021   PFIZER(Purple Top)SARS-COV-2 Vaccination 11/06/2019, 11/26/2019, 07/03/2020   Pneumococcal Conjugate-13 01/17/2014   Pneumococcal Polysaccharide-23 02/06/2004, 02/20/2007, 12/16/2011   Tdap 05/11/2014   Zoster, Live 02/04/2010    Hyperlipidemia This is a chronic problem. The problem is uncontrolled. Pertinent negatives include no chest pain or shortness of breath. She is currently on no antihyperlipidemic treatment.  Back Pain This is a recurrent problem. The pain is present in the lumbar spine. The pain radiates to the right thigh and left thigh. The pain is at a severity of 5/10. The pain is moderate. Pertinent negatives include no abdominal pain, chest pain, dysuria, fever or headaches.   Lab Results  Component Value Date   CREATININE 0.76 02/04/2021   BUN 11 02/04/2021   NA 142 02/04/2021   K 3.9 02/04/2021   CL 102 02/04/2021   CO2 23 02/04/2021   Lab Results  Component Value Date   CHOL 278 (H) 06/04/2020   HDL 66 06/04/2020   LDLCALC 189 (H) 06/04/2020   TRIG 128 06/04/2020   CHOLHDL 4.2 06/04/2020   Lab Results  Component Value Date   TSH 3.880 02/04/2021   No results found for: HGBA1C Lab Results  Component Value Date   WBC 10.1 06/04/2020    HGB 13.0 06/04/2020   HCT 39.3 06/04/2020   MCV 89 06/04/2020   PLT 262 06/04/2020   Lab Results  Component Value Date   ALT 9 06/04/2020   AST 13 06/04/2020   ALKPHOS 61 06/04/2020   BILITOT 0.5 06/04/2020  Last vitamin D Lab Results  Component Value Date   VD25OH 26.3 (L) 01/24/2020     Review of Systems  Constitutional:  Negative for chills, fatigue and fever.  HENT:  Positive for hearing loss. Negative for congestion, tinnitus, trouble swallowing and voice change.   Eyes:  Negative for visual disturbance.  Respiratory:  Negative for cough, chest tightness, shortness of breath and wheezing.   Cardiovascular:  Negative for chest pain, palpitations and leg swelling.  Gastrointestinal:  Negative for abdominal pain, constipation, diarrhea and vomiting.  Endocrine: Negative for polydipsia and polyuria.  Genitourinary:  Negative for dysuria, frequency, genital sores, vaginal bleeding and vaginal discharge.  Musculoskeletal:  Positive for back pain. Negative for arthralgias, gait problem and joint swelling.  Skin:  Negative for color change and rash.  Neurological:  Negative for dizziness, tremors, light-headedness and headaches.  Hematological:  Negative for adenopathy. Does not bruise/bleed easily.  Psychiatric/Behavioral:  Negative for dysphoric mood and sleep disturbance. The patient is not nervous/anxious.    Patient Active Problem List   Diagnosis Date Noted   Low back pain due to bilateral sciatica 06/04/2020   Ankle edema, bilateral 06/04/2020   Senile ecchymosis 06/04/2020   Arthritis of both acromioclavicular joints 09/05/2019   Bursitis of right shoulder  07/05/2019   Venous insufficiency of right leg 06/21/2019   Myalgia 06/21/2019   Vitamin D deficiency 06/21/2019   Carpal tunnel syndrome 04/10/2019   Acquired trigger finger 04/03/2019   Numbness of hand 04/03/2019   Foot pain, left 12/17/2017   Elevated TSH 05/03/2017   Age-related osteoporosis without  current pathological fracture 03/24/2016   Chronic dermatitis 07/09/2015   Allergic rhinitis, seasonal 12/12/2014   Gastro-esophageal reflux disease without esophagitis 12/12/2014   Mixed hyperlipidemia 12/12/2014   Degenerative arthritis of hip 12/12/2014   Chronic obstructive pulmonary disease, unspecified ([place]) 10/11/2014   Bilateral hearing loss 07/15/2014    Allergies  Allergen Reactions   Augmentin [Amoxicillin-Pot Clavulanate] Itching    Itching all over.   Codeine Rash    Past Surgical History:  Procedure Laterality Date   CATARACT EXTRACTION W/PHACO Left 02/02/2018   Procedure: CATARACT EXTRACTION PHACO AND INTRAOCULAR LENS PLACEMENT ([place]) LEFT  MALYUGIN;  Surgeon: Leandrew Koyanagi, MD;  Location: [facility];  Service: Ophthalmology;  Laterality: Left;   CATARACT EXTRACTION W/PHACO Right 05/03/2018   Procedure: CATARACT EXTRACTION PHACO AND INTRAOCULAR LENS PLACEMENT ([place]) RIGHT  IVA/TOPICAL;  Surgeon: Leandrew Koyanagi, MD;  Location: [facility];  Service: Ophthalmology;  Laterality: Right;   MINOR HEMORRHOIDECTOMY      Social History   Tobacco Use   Smoking status: Former    Packs/day: 0.50    Years: 30.00    Pack years: 15.00    Types: Cigarettes    Quit date: 09/07/2000    Years since quitting: 20.7   Smokeless tobacco: Never   Tobacco comments:    smoking cessation materials not required  Vaping Use   Vaping Use: Never used  Substance Use Topics   Alcohol use: Yes    Alcohol/week: 1.0 standard drink    Types: 1 Glasses of wine per week    Comment: social   Drug use: No     Medication list has been reviewed and updated.  Current Meds  Medication Sig   acetaminophen (TYLENOL) 650 MG CR tablet Take 650 mg by mouth every 8 (eight) hours as needed for pain.   albuterol (PROVENTIL) (2.5 MG/3ML) 0.083% nebulizer solution Take 3 mLs by nebulization every 6 (six) hours as needed for wheezing or shortness of breath.   albuterol  (VENTOLIN HFA) 108 (90 Base) MCG/ACT inhaler Inhale 2 puffs into the lungs every 6 (six) hours as needed for wheezing or shortness of breath.   aspirin 81 MG chewable tablet Chew 1 tablet by mouth daily.   Calcium Carbonate-Vit D-Min (CALTRATE 600+D PLUS MINERALS) 600-800 MG-UNIT CHEW Chew 1 tablet by mouth daily.   Cholecalciferol (VITAMIN D) 125 MCG (5000 UT) CAPS Take 1 capsule by mouth daily.   docusate sodium (COLACE) 100 MG capsule Take 1 capsule by mouth daily.   famotidine (PEPCID) 20 MG tablet Take 20 mg by mouth daily.   fluticasone (FLONASE) 50 MCG/ACT nasal spray Use 2 spray(s) in each nostril once daily   Fluticasone-Umeclidin-Vilant (TRELEGY ELLIPTA) 100-62.5-25 MCG/INH AEPB Inhale 1 puff into the lungs daily.   gabapentin (NEURONTIN) 100 MG capsule Take 2 capsules (200 mg total) by mouth at bedtime.   Multiple Vitamin (MULTIVITAMIN ADULT PO) Take by mouth.   triamterene-hydrochlorothiazide (MAXZIDE-25) 37.5-25 MG tablet Take 1 tablet by mouth daily.    PHQ 2/9 Scores 06/06/2021 04/23/2021 02/04/2021 04/03/2020  PHQ - 2 Score 0 0 1 0  PHQ- 9 Score 2 - 8 -    GAD 7 : Generalized Anxiety  Score 06/06/2021 02/04/2021  Nervous, Anxious, on Edge 0 1  Control/stop worrying 0 1  Worry too much - different things 0 1  Trouble relaxing 0 0  Restless 1 0  Easily annoyed or irritable 0 0  Afraid - awful might happen 0 0  Total GAD 7 Score 1 3  Anxiety Difficulty Not difficult at all -    BP Readings from Last 3 Encounters:  06/06/21 122/78  05/02/21 119/65  02/04/21 (!) 142/76    Physical Exam Vitals and nursing note reviewed.  Constitutional:      General: She is not in acute distress.    Appearance: She is well-developed.  HENT:     Head: Normocephalic and atraumatic.     Right Ear: Tympanic membrane and ear canal normal. Decreased hearing noted.     Left Ear: Tympanic membrane and ear canal normal. Decreased hearing noted.     Nose:     Right Sinus: No maxillary sinus  tenderness.     Left Sinus: No maxillary sinus tenderness.  Eyes:     General: No scleral icterus.       Right eye: No discharge.        Left eye: No discharge.     Conjunctiva/sclera: Conjunctivae normal.  Neck:     Thyroid: No thyromegaly.     Vascular: No carotid bruit.  Cardiovascular:     Rate and Rhythm: Normal rate and regular rhythm.     Pulses: Normal pulses.     Heart sounds: Normal heart sounds.  Pulmonary:     Effort: Pulmonary effort is normal. No respiratory distress.     Breath sounds: No wheezing.  Chest:  Breasts:    Right: No mass, nipple discharge, skin change or tenderness.     Left: No mass, nipple discharge, skin change or tenderness.  Abdominal:     General: Bowel sounds are normal.     Palpations: Abdomen is soft.     Tenderness: There is no abdominal tenderness.  Musculoskeletal:     Cervical back: Normal range of motion. No erythema.     Lumbar back: Bony tenderness present. Decreased range of motion. Positive right straight leg raise test and positive left straight leg raise test.     Right lower leg: No edema.     Left lower leg: No edema.  Lymphadenopathy:     Cervical: No cervical adenopathy.  Skin:    General: Skin is warm and dry.     Findings: No rash.  Neurological:     Mental Status: She is alert and oriented to person, place, and time.     Cranial Nerves: No cranial nerve deficit.     Sensory: No sensory deficit.     Deep Tendon Reflexes: Reflexes are normal and symmetric.  Psychiatric:        Attention and Perception: Attention normal.        Mood and Affect: Mood normal.        Speech: Speech normal.    Wt Readings from Last 3 Encounters:  06/06/21 175 lb (79.4 kg)  02/04/21 179 lb (81.2 kg)  06/04/20 176 lb (79.8 kg)    BP 122/78   Pulse 78   Ht 5\' 2"  (1.575 m)   Wt 175 lb (79.4 kg)   SpO2 98%   BMI 32.01 kg/m   Assessment and Plan: 1. Annual physical exam Continue activity at tolerated She declines Shingrix - will  get flu vaccine today She requests referral to  Cardiology for recommend q 2 yr follow up. - CBC with Differential/Platelet  2. Age-related osteoporosis without current pathological fracture Due for repeat study Continue calcium and vitamin D - DG Bone Density  3. Pulmonary emphysema, unspecified emphysema type ([place]) Stable pulmonary sx with no recent infections Mild DOE resolves quickly with rest Pneumonia vaccine series complete - Fluticasone-Umeclidin-Vilant (TRELEGY ELLIPTA) 100-62.5-25 MCG/INH AEPB; Inhale 1 puff into the lungs daily.  Dispense: 60 each; Refill: 5 - albuterol (VENTOLIN HFA) 108 (90 Base) MCG/ACT inhaler; Inhale 2 puffs into the lungs every 6 (six) hours as needed for wheezing or shortness of breath.  Dispense: 54 g; Refill: 3  4. Mixed hyperlipidemia Declines statin therapy - Comprehensive metabolic panel - Lipid panel  5. Ankle edema, bilateral Controlled with PRN maxzide - TSH - triamterene-hydrochlorothiazide (MAXZIDE-25) 37.5-25 MG tablet; Take 1 tablet by mouth daily.  Dispense: 90 tablet; Refill: 1  6. Vitamin D deficiency Continue supplementation - VITAMIN D 25 Hydroxy (Vit-D Deficiency, Fractures)  7. Low back pain due to bilateral sciatica Follow up with Pain management for ESI - gabapentin (NEURONTIN) 100 MG capsule; Take 2 capsules (200 mg total) by mouth at bedtime.  Dispense: 180 capsule; Refill: 1   Partially dictated using Editor, commissioning. Any errors are unintentional.  Halina Maidens, MD [facility]  06/06/2021

## 2021-06-07 LAB — CBC WITH DIFFERENTIAL/PLATELET
Basophils Absolute: 0.1 10*3/uL (ref 0.0–0.2)
Basos: 1 %
EOS (ABSOLUTE): 0.2 10*3/uL (ref 0.0–0.4)
Eos: 2 %
Hematocrit: 42.1 % (ref 34.0–46.6)
Hemoglobin: 13.9 g/dL (ref 11.1–15.9)
Immature Grans (Abs): 0 10*3/uL (ref 0.0–0.1)
Immature Granulocytes: 0 %
Lymphocytes Absolute: 4.1 10*3/uL — ABNORMAL HIGH (ref 0.7–3.1)
Lymphs: 39 %
MCH: 28.8 pg (ref 26.6–33.0)
MCHC: 33 g/dL (ref 31.5–35.7)
MCV: 87 fL (ref 79–97)
Monocytes Absolute: 1 10*3/uL — ABNORMAL HIGH (ref 0.1–0.9)
Monocytes: 9 %
Neutrophils Absolute: 5.3 10*3/uL (ref 1.4–7.0)
Neutrophils: 49 %
Platelets: 304 10*3/uL (ref 150–450)
RBC: 4.82 x10E6/uL (ref 3.77–5.28)
RDW: 13 % (ref 11.7–15.4)
WBC: 10.7 10*3/uL (ref 3.4–10.8)

## 2021-06-07 LAB — COMPREHENSIVE METABOLIC PANEL
ALT: 8 IU/L (ref 0–32)
AST: 16 IU/L (ref 0–40)
Albumin/Globulin Ratio: 1.6 (ref 1.2–2.2)
Albumin: 4.4 g/dL (ref 3.7–4.7)
Alkaline Phosphatase: 64 IU/L (ref 44–121)
BUN/Creatinine Ratio: 13 (ref 12–28)
BUN: 10 mg/dL (ref 8–27)
Bilirubin Total: 0.4 mg/dL (ref 0.0–1.2)
CO2: 24 mmol/L (ref 20–29)
Calcium: 9.4 mg/dL (ref 8.7–10.3)
Chloride: 99 mmol/L (ref 96–106)
Creatinine, Ser: 0.79 mg/dL (ref 0.57–1.00)
Globulin, Total: 2.7 g/dL (ref 1.5–4.5)
Glucose: 82 mg/dL (ref 70–99)
Potassium: 4.4 mmol/L (ref 3.5–5.2)
Sodium: 142 mmol/L (ref 134–144)
Total Protein: 7.1 g/dL (ref 6.0–8.5)
eGFR: 76 mL/min/{1.73_m2} (ref >59)

## 2021-06-07 LAB — TSH: TSH: 4.1 u[IU]/mL (ref 0.450–4.500)

## 2021-06-07 LAB — LIPID PANEL
Chol/HDL Ratio: 4.5 ratio — ABNORMAL HIGH (ref 0.0–4.4)
Cholesterol, Total: 267 mg/dL — ABNORMAL HIGH (ref 100–199)
HDL: 60 mg/dL (ref >39)
LDL Chol Calc (NIH): 176 mg/dL — ABNORMAL HIGH (ref 0–99)
Triglycerides: 167 mg/dL — ABNORMAL HIGH (ref 0–149)
VLDL Cholesterol Cal: 31 mg/dL (ref 5–40)

## 2021-06-07 LAB — VITAMIN D 25 HYDROXY (VIT D DEFICIENCY, FRACTURES): Vit D, 25-Hydroxy: 39.6 ng/mL (ref 30.0–100.0)

## 2021-06-11 ENCOUNTER — Ambulatory Visit
Admission: RE | Admit: 2021-06-11 | Discharge: 2021-06-11 | Disposition: A | Discharge status: Home / Self Care | Payer: Medicare HMO | Source: Ambulatory Visit | Attending: Internal Medicine | Admitting: Internal Medicine

## 2021-06-11 ENCOUNTER — Other Ambulatory Visit: Payer: Self-pay

## 2021-06-11 ENCOUNTER — Telehealth: Payer: Self-pay

## 2021-06-11 DIAGNOSIS — M810 Age-related osteoporosis without current pathological fracture: Secondary | ICD-10-CM | POA: Insufficient documentation

## 2021-06-11 DIAGNOSIS — M8588 Other specified disorders of bone density and structure, other site: Secondary | ICD-10-CM | POA: Diagnosis not present

## 2021-06-11 NOTE — Telephone Encounter (Signed)
Copied from [place] [phone]. Topic: Referral - Question >> Jun 10, 2021  5:07 PM Antonieta Iba C wrote: Reason for CRM: pt says that she has a dermatology referral. Pt would like to make sure that it was approved by insurance.   Please assist.

## 2021-06-11 NOTE — Telephone Encounter (Signed)
Copied from [place] [phone]. Topic: General - Other >> Jun 10, 2021  5:01 PM Wynetta Emery, [place] C wrote: Reason for CRM: pt called in to request to have her most recent lab results mailed to her. Verified pt's mailing address.   Please assist further.

## 2021-06-11 NOTE — Telephone Encounter (Signed)
Copied from [place] [phone]. Topic: General - Other >> Jun 11, 2021  8:20 AM Valere Dross wrote: Reason for CRM: Pt called in stating she wanted Dr Army Melia to reach out to her about her dermatology appt 10/13 to make sure it was approved with her insurance. Please advise.

## 2021-06-11 NOTE — Telephone Encounter (Signed)
Patient informed bone density has already been ordered.

## 2021-06-12 NOTE — Progress Notes (Signed)
Called pt left VM to call back. ? ?PEC nurse may give results to patient if they return call to clinic, a CRM has been created. ? ?KP

## 2021-06-19 DIAGNOSIS — L821 Other seborrheic keratosis: Secondary | ICD-10-CM | POA: Diagnosis not present

## 2021-06-19 DIAGNOSIS — L570 Actinic keratosis: Secondary | ICD-10-CM | POA: Diagnosis not present

## 2021-06-19 DIAGNOSIS — L720 Epidermal cyst: Secondary | ICD-10-CM | POA: Diagnosis not present

## 2021-06-19 DIAGNOSIS — D485 Neoplasm of uncertain behavior of skin: Secondary | ICD-10-CM | POA: Diagnosis not present

## 2021-06-19 DIAGNOSIS — C44519 Basal cell carcinoma of skin of other part of trunk: Secondary | ICD-10-CM | POA: Diagnosis not present

## 2021-06-19 DIAGNOSIS — L578 Other skin changes due to chronic exposure to nonionizing radiation: Secondary | ICD-10-CM | POA: Diagnosis not present

## 2021-06-23 DIAGNOSIS — M48062 Spinal stenosis, lumbar region with neurogenic claudication: Secondary | ICD-10-CM | POA: Diagnosis not present

## 2021-06-25 DIAGNOSIS — J438 Other emphysema: Secondary | ICD-10-CM | POA: Diagnosis not present

## 2021-06-25 DIAGNOSIS — R0602 Shortness of breath: Secondary | ICD-10-CM | POA: Diagnosis not present

## 2021-06-25 DIAGNOSIS — R600 Localized edema: Secondary | ICD-10-CM | POA: Diagnosis not present

## 2021-06-27 ENCOUNTER — Other Ambulatory Visit: Payer: Self-pay | Admitting: Cardiology

## 2021-06-27 DIAGNOSIS — R600 Localized edema: Secondary | ICD-10-CM

## 2021-07-17 DIAGNOSIS — R0602 Shortness of breath: Secondary | ICD-10-CM | POA: Diagnosis not present

## 2021-07-17 DIAGNOSIS — R600 Localized edema: Secondary | ICD-10-CM | POA: Diagnosis not present

## 2021-07-28 ENCOUNTER — Telehealth: Payer: Self-pay | Admitting: Internal Medicine

## 2021-07-28 ENCOUNTER — Telehealth: Payer: Self-pay

## 2021-07-28 ENCOUNTER — Other Ambulatory Visit: Payer: Self-pay

## 2021-07-28 DIAGNOSIS — M65312 Trigger thumb, left thumb: Secondary | ICD-10-CM

## 2021-07-28 NOTE — Telephone Encounter (Signed)
Ms. Capps called asking if Dr. Army Melia can send over a referral to [facility]. Pt states insurance is requesting it. Her appt is in the morning at 10:00 am.

## 2021-07-28 NOTE — Telephone Encounter (Signed)
Copied from [place] [phone]. Topic: Referral - Request for Referral >> Jul 28, 2021  2:55 PM Oneta Rack wrote: Has patient seen PCP for this complaint? I do not know.  *If NO, is insurance requiring patient see PCP for this issue before PCP can refer them? Insurance is requiring patient to obtain a referral from PCP Referral for which specialty: orthopedic Preferred provider/office: Dr. Nelva Bush [facility]  Reason for referral: steroid shot for left trigger thumb >> Jul 28, 2021  3:03 PM Oneta Rack wrote: Patient would like a call back when referral has been placed.

## 2021-07-28 NOTE — Telephone Encounter (Signed)
Ms  

## 2021-07-28 NOTE — Telephone Encounter (Signed)
Referral sent 

## 2021-07-29 ENCOUNTER — Other Ambulatory Visit: Payer: Self-pay

## 2021-07-29 ENCOUNTER — Telehealth: Payer: Self-pay | Admitting: Internal Medicine

## 2021-07-29 DIAGNOSIS — M65312 Trigger thumb, left thumb: Secondary | ICD-10-CM | POA: Diagnosis not present

## 2021-07-29 DIAGNOSIS — M7551 Bursitis of right shoulder: Secondary | ICD-10-CM

## 2021-07-29 DIAGNOSIS — L814 Other melanin hyperpigmentation: Secondary | ICD-10-CM

## 2021-07-29 NOTE — Telephone Encounter (Signed)
error 

## 2021-07-29 NOTE — Telephone Encounter (Signed)
Pt called this morning stating that she is needing a PA for the appt she has this morning with [facility] according to her insurance. Spoke with Levada Dy in office and she stated that she would relay message top nurses due to the pts appt being at 10 am today. PT also requesting to have a referral for Dr. Hermina Barters in [facility] 08/05/21 at 3:50pm . Pt also requesting to have an X Ray for her leg as well [place] on 08/05/21 at 1pm. And a Dr. Posey Pronto at [facility] in [place] for the steroid shot in her shoulder. Please advise.

## 2021-08-04 ENCOUNTER — Telehealth: Payer: Self-pay | Admitting: Internal Medicine

## 2021-08-04 NOTE — Telephone Encounter (Signed)
Patient called in states she was told by insurance that she needs prior auth with demotologist and rheumotalogist but the referrals are telling her she doesnt need one but insurance says she done.  she already has the referrals. Please call back

## 2021-08-05 ENCOUNTER — Ambulatory Visit
Admission: RE | Admit: 2021-08-05 | Discharge: 2021-08-05 | Disposition: A | Discharge status: Home / Self Care | Payer: Medicare HMO | Source: Ambulatory Visit | Attending: Cardiology | Admitting: Cardiology

## 2021-08-05 ENCOUNTER — Other Ambulatory Visit: Payer: Self-pay

## 2021-08-05 DIAGNOSIS — R600 Localized edema: Secondary | ICD-10-CM | POA: Insufficient documentation

## 2021-08-05 DIAGNOSIS — C4491 Basal cell carcinoma of skin, unspecified: Secondary | ICD-10-CM | POA: Diagnosis not present

## 2021-08-05 DIAGNOSIS — C44519 Basal cell carcinoma of skin of other part of trunk: Secondary | ICD-10-CM | POA: Diagnosis not present

## 2021-08-05 DIAGNOSIS — M79604 Pain in right leg: Secondary | ICD-10-CM | POA: Diagnosis not present

## 2021-08-05 NOTE — Telephone Encounter (Signed)
Tried calling patient. Left VM informing her to call office back as needed. Informed her there is no such thing as a prior authorization for a referral. We only have every done PA's on medication and imaging for our patients. IF she needs something from the specialists done then she will need to contact them. We do not do prior auths for other offices.

## 2021-08-14 DIAGNOSIS — M19012 Primary osteoarthritis, left shoulder: Secondary | ICD-10-CM | POA: Diagnosis not present

## 2021-08-14 DIAGNOSIS — M7551 Bursitis of right shoulder: Secondary | ICD-10-CM | POA: Diagnosis not present

## 2021-08-14 DIAGNOSIS — M19011 Primary osteoarthritis, right shoulder: Secondary | ICD-10-CM | POA: Diagnosis not present

## 2021-09-12 DIAGNOSIS — R0602 Shortness of breath: Secondary | ICD-10-CM | POA: Diagnosis not present

## 2021-10-29 DIAGNOSIS — M65312 Trigger thumb, left thumb: Secondary | ICD-10-CM | POA: Diagnosis not present

## 2021-11-09 DIAGNOSIS — J329 Chronic sinusitis, unspecified: Secondary | ICD-10-CM | POA: Diagnosis not present

## 2021-12-04 ENCOUNTER — Ambulatory Visit (INDEPENDENT_AMBULATORY_CARE_PROVIDER_SITE_OTHER): Payer: Medicare HMO | Admitting: Internal Medicine

## 2021-12-04 ENCOUNTER — Encounter: Payer: Self-pay | Admitting: Internal Medicine

## 2021-12-04 VITALS — BP 132/64 | HR 81 | Ht 62.0 in | Wt 174.0 lb

## 2021-12-04 DIAGNOSIS — K219 Gastro-esophageal reflux disease without esophagitis: Secondary | ICD-10-CM

## 2021-12-04 DIAGNOSIS — J302 Other seasonal allergic rhinitis: Secondary | ICD-10-CM | POA: Diagnosis not present

## 2021-12-04 DIAGNOSIS — M810 Age-related osteoporosis without current pathological fracture: Secondary | ICD-10-CM | POA: Diagnosis not present

## 2021-12-04 DIAGNOSIS — M25471 Effusion, right ankle: Secondary | ICD-10-CM | POA: Diagnosis not present

## 2021-12-04 DIAGNOSIS — J439 Emphysema, unspecified: Secondary | ICD-10-CM | POA: Diagnosis not present

## 2021-12-04 DIAGNOSIS — M25472 Effusion, left ankle: Secondary | ICD-10-CM

## 2021-12-04 MED ORDER — ESOMEPRAZOLE MAGNESIUM 40 MG PO CPDR: 40.0000 mg | DELAYED_RELEASE_CAPSULE | Freq: Every day | ORAL | 0 refills | Status: DC

## 2021-12-04 NOTE — Progress Notes (Signed)
? ? ?Date:  12/04/2021  ? ?Name:  Monica Campbell   DOB:  Oct 03, 1940   MRN:  [id number] ? ? ?Chief Complaint: COPD and Edema (Still having swelling, right ankle, comes and goes ) ? ?COPD ?She complains of cough and shortness of breath. There is no sputum production. The problem has been unchanged (was seen by [facility] three weeks ago and given steroid taper). The cough is non-productive. Pertinent negatives include no fever. Her symptoms are alleviated by steroid inhaler, ipratropium and beta-agonist (Trelegy). Her past medical history is significant for COPD.  ?Sore Throat  ?This is a recurrent problem. The problem has been waxing and waning. The pain is worse on the right side. There has been no fever. The pain is moderate. Associated symptoms include coughing and shortness of breath. Treatments tried: pepcid PRN.  ?Edema - she has intermittent ankle swelling which is mild.  She take a diuretic only rarely if the edema is uncomfortable. ?OP- noted on last DEXA.  She declined treatment/referral for infusion therapy.  She does continue on calcium and vitamin D and remains as physically active as possible. ? ?Lab Results  ?Component Value Date  ? NA 142 06/06/2021  ? K 4.4 06/06/2021  ? CO2 24 06/06/2021  ? GLUCOSE 82 06/06/2021  ? BUN 10 06/06/2021  ? CREATININE 0.79 06/06/2021  ? CALCIUM 9.4 06/06/2021  ? EGFR 76 06/06/2021  ? GFRNONAA 71 06/04/2020  ? ?Lab Results  ?Component Value Date  ? CHOL 267 (H) 06/06/2021  ? HDL 60 06/06/2021  ? LDLCALC 176 (H) 06/06/2021  ? TRIG 167 (H) 06/06/2021  ? CHOLHDL 4.5 (H) 06/06/2021  ? ?Lab Results  ?Component Value Date  ? TSH 4.100 06/06/2021  ? ?No results found for: HGBA1C ?Lab Results  ?Component Value Date  ? WBC 10.7 06/06/2021  ? HGB 13.9 06/06/2021  ? HCT 42.1 06/06/2021  ? MCV 87 06/06/2021  ? PLT 304 06/06/2021  ? ?Lab Results  ?Component Value Date  ? ALT 8 06/06/2021  ? AST 16 06/06/2021  ? ALKPHOS 64 06/06/2021  ? BILITOT 0.4 06/06/2021  ? ?Lab Results  ?Component  Value Date  ? VD25OH 39.6 06/06/2021  ?  ? ?Review of Systems  ?Constitutional:  Negative for chills, fatigue and fever.  ?Respiratory:  Positive for cough and shortness of breath. Negative for sputum production.   ? ?Patient Active Problem List  ? Diagnosis Date Noted  ? Low back pain due to bilateral sciatica 06/04/2020  ? Ankle edema, bilateral 06/04/2020  ? Senile ecchymosis 06/04/2020  ? Arthritis of both acromioclavicular joints 09/05/2019  ? Bursitis of right shoulder 07/05/2019  ? Venous insufficiency of right leg 06/21/2019  ? Myalgia 06/21/2019  ? Vitamin D deficiency 06/21/2019  ? Carpal tunnel syndrome 04/10/2019  ? Acquired trigger finger 04/03/2019  ? Numbness of hand 04/03/2019  ? Foot pain, left 12/17/2017  ? Elevated TSH 05/03/2017  ? Age-related osteoporosis without current pathological fracture 03/24/2016  ? Chronic dermatitis 07/09/2015  ? Allergic rhinitis, seasonal 12/12/2014  ? Gastro-esophageal reflux disease without esophagitis 12/12/2014  ? Mixed hyperlipidemia 12/12/2014  ? Degenerative arthritis of hip 12/12/2014  ? Chronic obstructive pulmonary disease, unspecified ([place]) 10/11/2014  ? Bilateral hearing loss 07/15/2014  ? ? ?Allergies  ?Allergen Reactions  ? Augmentin [Amoxicillin-Pot Clavulanate] Itching  ?  Itching all over.  ? Codeine Rash  ? ? ?Past Surgical History:  ?Procedure Laterality Date  ? CATARACT EXTRACTION W/PHACO Left 02/02/2018  ? Procedure: CATARACT EXTRACTION  PHACO AND INTRAOCULAR LENS PLACEMENT (IOC) LEFT  MALYUGIN;  Surgeon: Lockie Mola, MD;  Location[facility];  Service: Ophthalmology;  Laterality: Left;  ? CATARACT EXTRACTION W/PHACO Right 05/03/2018  ? Procedure: CATARACT EXTRACTION PHACO AND INTRAOCULAR LENS PLACEMENT (IOC) RIGHT  IVA/TOPICAL;  Surgeon: Lockie Mola, MD;  Location: [facility];  Service: Ophthalmology;  Laterality: Right;  ? MINOR HEMORRHOIDECTOMY    ? ? ?Social History  ? ?Tobacco Use  ? Smoking status: Former  ?   Packs/day: 0.50  ?  Years: 30.00  ?  Pack years: 15.00  ?  Types: Cigarettes  ?  Quit date: 09/07/2000  ?  Years since quitting: 21.2  ? Smokeless tobacco: Never  ? Tobacco comments:  ?  smoking cessation materials not required  ?Vaping Use  ? Vaping Use: Never used  ?Substance Use Topics  ? Alcohol use: Yes  ?  Alcohol/week: 1.0 standard drink  ?  Types: 1 Glasses of wine per week  ?  Comment: social  ? Drug use: No  ? ? ? ?Medication list has been reviewed and updated. ? ?Current Meds  ?Medication Sig  ? acetaminophen (TYLENOL) 650 MG CR tablet Take 650 mg by mouth every 8 (eight) hours as needed for pain.  ? albuterol (PROVENTIL) (2.5 MG/3ML) 0.083% nebulizer solution Take 3 mLs by nebulization every 6 (six) hours as needed for wheezing or shortness of breath.  ? albuterol (VENTOLIN HFA) 108 (90 Base) MCG/ACT inhaler Inhale 2 puffs into the lungs every 6 (six) hours as needed for wheezing or shortness of breath.  ? aspirin 81 MG chewable tablet Chew 1 tablet by mouth daily.  ? atorvastatin (LIPITOR) 20 MG tablet Take 20 mg by mouth daily.  ? Calcium Carbonate-Vit D-Min (CALTRATE 600+D PLUS MINERALS) 600-800 MG-UNIT CHEW Chew 1 tablet by mouth daily.  ? Cholecalciferol (VITAMIN D) 125 MCG (5000 UT) CAPS Take 1 capsule by mouth daily.  ? docusate sodium (COLACE) 100 MG capsule Take 1 capsule by mouth daily.  ? esomeprazole (NEXIUM) 40 MG capsule Take 1 capsule (40 mg total) by mouth daily.  ? famotidine (PEPCID) 20 MG tablet Take 20 mg by mouth as needed.  ? fluticasone (FLONASE) 50 MCG/ACT nasal spray Use 2 spray(s) in each nostril once daily  ? Fluticasone-Umeclidin-Vilant (TRELEGY ELLIPTA) 100-62.5-25 MCG/INH AEPB Inhale 1 puff into the lungs daily.  ? gabapentin (NEURONTIN) 100 MG capsule Take 2 capsules (200 mg total) by mouth at bedtime. (Patient taking differently: Take 100 mg by mouth at bedtime.)  ? meloxicam (MOBIC) 15 MG tablet Take 15 mg by mouth daily.  ? Multiple Vitamin (MULTIVITAMIN ADULT PO) Take by  mouth.  ? triamterene-hydrochlorothiazide (MAXZIDE-25) 37.5-25 MG tablet Take 1 tablet by mouth daily.  ? ? ? ?  12/04/2021  ? 10:22 AM 06/06/2021  ? 10:46 AM 02/04/2021  ?  3:45 PM  ?GAD 7 : Generalized Anxiety Score  ?Nervous, Anxious, on Edge 0 0 1  ?Control/stop worrying 0 0 1  ?Worry too much - different things 0 0 1  ?Trouble relaxing 0 0 0  ?Restless 0 1 0  ?Easily annoyed or irritable 1 0 0  ?Afraid - awful might happen 1 0 0  ?Total GAD 7 Score 2 1 3   ?Anxiety Difficulty  Not difficult at all   ? ? ? ?  12/04/2021  ? 10:22 AM  ?Depression screen PHQ 2/9  ?Decreased Interest 0  ?Down, Depressed, Hopeless 0  ?PHQ - 2 Score 0  ?  Altered sleeping 0  ?Tired, decreased energy 1  ?Change in appetite 0  ?Feeling bad or failure about yourself  0  ?Trouble concentrating 0  ?Moving slowly or fidgety/restless 0  ?Suicidal thoughts 0  ?PHQ-9 Score 1  ?Difficult doing work/chores Not difficult at all  ? ? ?BP Readings from Last 3 Encounters:  ?12/04/21 132/64  ?06/06/21 122/78  ?05/02/21 119/65  ? ? ?Physical Exam ?Vitals and nursing note reviewed.  ?Constitutional:   ?   General: She is not in acute distress. ?   Appearance: She is well-developed.  ?HENT:  ?   Head: Normocephalic and atraumatic.  ?   Mouth/Throat:  ?   Pharynx: No posterior oropharyngeal erythema or uvula swelling.  ?Neck:  ?   Vascular: No carotid bruit.  ?Cardiovascular:  ?   Rate and Rhythm: Normal rate and regular rhythm.  ?   Pulses: Normal pulses.  ?   Heart sounds: No murmur heard. ?Pulmonary:  ?   Effort: Pulmonary effort is normal. No respiratory distress.  ?   Breath sounds: No wheezing or rhonchi.  ?Musculoskeletal:  ?   Cervical back: Normal range of motion.  ?Lymphadenopathy:  ?   Head:  ?   Right side of head: Submandibular adenopathy present.  ?   Left side of head: No submandibular adenopathy.  ?   Cervical: Cervical adenopathy present.  ?Skin: ?   General: Skin is warm and dry.  ?   Findings: No rash.  ?Neurological:  ?   Mental Status: She  is alert and oriented to person, place, and time.  ?Psychiatric:     ?   Mood and Affect: Mood normal.     ?   Behavior: Behavior normal.  ? ? ?Wt Readings from Last 3 Encounters:  ?12/04/21 174 lb (

## 2021-12-18 DIAGNOSIS — M65312 Trigger thumb, left thumb: Secondary | ICD-10-CM | POA: Diagnosis not present

## 2022-02-03 DIAGNOSIS — L218 Other seborrheic dermatitis: Secondary | ICD-10-CM | POA: Diagnosis not present

## 2022-02-03 DIAGNOSIS — L578 Other skin changes due to chronic exposure to nonionizing radiation: Secondary | ICD-10-CM | POA: Diagnosis not present

## 2022-02-03 DIAGNOSIS — Z85828 Personal history of other malignant neoplasm of skin: Secondary | ICD-10-CM | POA: Diagnosis not present

## 2022-02-03 DIAGNOSIS — L821 Other seborrheic keratosis: Secondary | ICD-10-CM | POA: Diagnosis not present

## 2022-02-03 DIAGNOSIS — D492 Neoplasm of unspecified behavior of bone, soft tissue, and skin: Secondary | ICD-10-CM | POA: Diagnosis not present

## 2022-03-26 ENCOUNTER — Other Ambulatory Visit: Payer: Self-pay | Admitting: Internal Medicine

## 2022-03-26 DIAGNOSIS — Z1231 Encounter for screening mammogram for malignant neoplasm of breast: Secondary | ICD-10-CM

## 2022-03-30 ENCOUNTER — Other Ambulatory Visit: Payer: Self-pay

## 2022-03-30 DIAGNOSIS — M5441 Lumbago with sciatica, right side: Secondary | ICD-10-CM

## 2022-03-30 MED ORDER — GABAPENTIN 100 MG PO CAPS: 100.0000 mg | ORAL_CAPSULE | Freq: Every day | ORAL | 0 refills | Status: DC

## 2022-04-27 ENCOUNTER — Ambulatory Visit: Payer: Medicare HMO

## 2022-05-01 ENCOUNTER — Ambulatory Visit (INDEPENDENT_AMBULATORY_CARE_PROVIDER_SITE_OTHER): Payer: Medicare HMO

## 2022-05-01 VITALS — BP 125/71 | HR 65

## 2022-05-01 DIAGNOSIS — Z0000 Encounter for general adult medical examination without abnormal findings: Secondary | ICD-10-CM | POA: Diagnosis not present

## 2022-05-01 NOTE — Progress Notes (Signed)
I connected with  [name] on 05/01/22 by a audio enabled telemedicine application and verified that I am speaking with the correct person using two identifiers.  Patient Location: Home  Provider Location: Office/Clinic  I discussed the limitations of evaluation and management by telemedicine. The patient expressed understanding and agreed to proceed.  Subjective:   Monica Campbell is a 81 y.o. female who presents for Medicare Annual (Subsequent) preventive examination.  Review of Systems    Per HPI unless specifically indicated below Cardiac Risk Factors include: advanced age (>85mn, >>51women);female gender, hyperlipidemia.            Objective:    Today's Vitals   05/01/22 1454 05/01/22 1455  BP: 125/71   Pulse: 65   PainSc:  0-No pain      05/01/2022    2:54 PM 12/04/2021   10:11 AM 06/06/2021   10:41 AM  Vitals with BMI  Height  '5\' 2"'$  '5\' 2"'$   Weight  174 lbs 175 lbs  BMI  348.5432  Systolic [id number] Diastolic 71 64 78  Pulse 65 81 78    There is no height or weight on file to calculate BMI.     04/23/2021    3:34 PM 04/03/2020   11:45 AM 04/03/2019   10:43 AM 05/03/2018    7:45 AM 03/23/2018    3:56 PM 02/02/2018    7:18 AM 07/05/2017    1:29 PM  Advanced Directives  Does Patient Have a Medical Advance Directive? Yes No Yes No Yes No Yes  Type of AParamed[place]of [place]  [place]  [place]    Does patient want to make changes to medical advance directive?   No - Patient declined      Copy of HViennain Chart? No - copy requested  No - copy requested  No - copy requested    Would patient like information on creating a medical advance directive?  No - Patient declined  No - Patient declined  No - Patient declined     Current Medications (verified) Outpatient Encounter Medications as of 05/01/2022  Medication Sig   acetaminophen  (TYLENOL) 650 MG CR tablet Take 650 mg by mouth every 8 (eight) hours as needed for pain.   albuterol (PROVENTIL) (2.5 MG/3ML) 0.083% nebulizer solution Take 3 mLs by nebulization every 6 (six) hours as needed for wheezing or shortness of breath.   albuterol (VENTOLIN HFA) 108 (90 Base) MCG/ACT inhaler Inhale 2 puffs into the lungs every 6 (six) hours as needed for wheezing or shortness of breath.   aspirin 81 MG chewable tablet Chew 1 tablet by mouth daily.   atorvastatin (LIPITOR) 20 MG tablet Take 20 mg by mouth daily.   Calcium Carbonate-Vit D-Min (CALTRATE 600+D PLUS MINERALS) 600-800 MG-UNIT CHEW Chew 1 tablet by mouth daily.   Cholecalciferol (VITAMIN D) 125 MCG (5000 UT) CAPS Take 1 capsule by mouth daily.   docusate sodium (COLACE) 100 MG capsule Take 1 capsule by mouth daily.   esomeprazole (NEXIUM) 40 MG capsule Take 1 capsule (40 mg total) by mouth daily.   famotidine (PEPCID) 20 MG tablet Take 20 mg by mouth as needed.   Fluticasone-Umeclidin-Vilant (TRELEGY ELLIPTA) 100-62.5-25 MCG/INH AEPB Inhale 1 puff into the lungs daily.   gabapentin (NEURONTIN) 100 MG capsule Take 1 capsule (100 mg total) by mouth at bedtime.   Multiple Vitamin (MULTIVITAMIN  ADULT PO) Take by mouth.   oxymetazoline (AFRIN) 0.05 % nasal spray Place 1 spray into both nostrils 2 (two) times daily as needed for congestion.   triamterene-hydrochlorothiazide (MAXZIDE-25) 37.5-25 MG tablet Take 1 tablet by mouth daily.   fluticasone (FLONASE) 50 MCG/ACT nasal spray Use 2 spray(s) in each nostril once daily (Patient not taking: Reported on 05/01/2022)   meloxicam (MOBIC) 15 MG tablet Take 15 mg by mouth daily. (Patient not taking: Reported on 05/01/2022)   No facility-administered encounter medications on file as of 05/01/2022.    Allergies (verified) Augmentin [amoxicillin-pot clavulanate] and Codeine   History: Past Medical History:  Diagnosis Date   COPD (chronic obstructive pulmonary disease) (HCC)    Dyspnea     GERD (gastroesophageal reflux disease)    Hard of hearing    Hypercholesteremia    Osteoarthritis    Seasonal allergies    Past Surgical History:  Procedure Laterality Date   CATARACT EXTRACTION W/PHACO Left 02/02/2018   Procedure: CATARACT EXTRACTION PHACO AND INTRAOCULAR LENS PLACEMENT ([place]) LEFT  MALYUGIN;  Surgeon: Leandrew Koyanagi, MD;  Location: [facility];  Service: Ophthalmology;  Laterality: Left;   CATARACT EXTRACTION W/PHACO Right 05/03/2018   Procedure: CATARACT EXTRACTION PHACO AND INTRAOCULAR LENS PLACEMENT ([place]) RIGHT  IVA/TOPICAL;  Surgeon: Leandrew Koyanagi, MD;  Location: [facility];  Service: Ophthalmology;  Laterality: Right;   MINOR HEMORRHOIDECTOMY     Family History  Problem Relation Age of Onset   Heart disease Mother    Heart disease Sister    Heart disease Brother    Cancer Father        colon   Breast cancer Neg Hx    Social History   Socioeconomic History   Marital status: Divorced    Spouse name: Not on file   Number of children: 3   Years of education: Not on file   Highest education level: 12th grade  Occupational History   Occupation: Retired  Tobacco Use   Smoking status: Former    Packs/day: 0.50    Years: 30.00    Total pack years: 15.00    Types: Cigarettes    Quit date: 09/07/2000    Years since quitting: 21.6   Smokeless tobacco: Never   Tobacco comments:    smoking cessation materials not required  Vaping Use   Vaping Use: Never used  Substance and Sexual Activity   Alcohol use: Yes    Alcohol/week: 1.0 standard drink of alcohol    Types: 1 Glasses of wine per week    Comment: social   Drug use: No   Sexual activity: Not Currently  Other Topics Concern   Not on file  Social History Narrative   Pt lives alone    Social Determinants of Health   Financial Resource Strain: Low Risk  (05/01/2022)   Overall Financial Resource Strain (CARDIA)    Difficulty of Paying Living Expenses: Not hard at all   Food Insecurity: No Food Insecurity (05/01/2022)   Hunger Vital Sign    Worried About Running Out of Food in the Last Year: Never true    Ran Out of Food in the Last Year: Never true  Transportation Needs: No Transportation Needs (05/01/2022)   PRAPARE - Hydrologist (Medical): No    Lack of Transportation (Non-Medical): No  Physical Activity: Sufficiently Active (05/01/2022)   Exercise Vital Sign    Days of Exercise per Week: 7 days    Minutes of Exercise per  Session: 30 min  Stress: No Stress Concern Present (05/01/2022)   [place]    Feeling of Stress : Not at all  Social Connections: Socially Isolated (05/01/2022)   Social Connection and Isolation Panel [NHANES]    Frequency of Communication with Friends and Family: More than three times a week    Frequency of Social Gatherings with Friends and Family: Twice a week    Attends Religious Services: Never    Marine scientist or Organizations: No    Attends Music therapist: Never    Marital Status: Divorced    Tobacco Counseling Counseling given: Not Answered Tobacco comments: smoking cessation materials not required   Clinical Intake:  Pre-visit preparation completed: No  Pain : 0-10 Pain Score: 0-No pain Pain Type: Chronic pain Pain Location: Buttocks Pain Descriptors / Indicators: Aching Pain Frequency: Intermittent        How often do you need to have someone help you when you read instructions, pamphlets, or other written materials from your doctor or pharmacy?: 1 - Never  Diabetic?No  Interpreter Needed?: No  Information entered by :: Donnie Mesa, CMA   Activities of Daily Living    05/01/2022    3:03 PM 12/04/2021   10:22 AM  In your present state of health, do you have any difficulty performing the following activities:  Hearing? 1 1  Vision? 1 1  Comment 05/05/22 [facility]    Difficulty concentrating or making decisions? 1 1  Walking or climbing stairs? 0 1  Dressing or bathing? 0 0  Doing errands, shopping? 0 0    Patient Care Team: Glean Hess, MD as PCP - General (Internal Medicine) Leandrew Koyanagi, MD as Referring Physician (Ophthalmology) Doyle Askew, MD as Referring Physician (Physical Medicine and Rehabilitation) Quintin Alto, MD as Consulting Physician (Rheumatology) Ree Edman, MD (Dermatology) Andrez Grime, MD as Referring Physician (Cardiology)  Indicate any recent Medical Services you may have received from other than [facility] providers in the past year (date may be approximate). No hospitalizations in the past 12 months.    Assessment:   This is a routine wellness examination for [place].  Hearing/Vision screen Bilateral hearing loss,07/15/2014 Denies vision issues, Annual eye Exam [facility]  Dietary issues and exercise activities discussed: Current Exercise Habits: Home exercise routine, Time (Minutes): 30, Frequency (Times/Week): 3, Weekly Exercise (Minutes/Week): 90, Intensity: Mild, Exercise limited by: None identified   Goals Addressed             This Visit's Progress    Stay Active and Independent       Why is this important?   Regular activity or exercise is important to managing back pain.  Activity helps to keep your muscles strong.  You will sleep better and feel more relaxed.  You will have more energy and feel less stressed.  If you are not active now, start slowly. Little changes make a big difference.  Rest, but not too much.  Stay as active as you can and listen to your body's signals.            Depression Screen    05/01/2022    2:58 PM 12/04/2021   10:22 AM 06/06/2021   10:46 AM 04/23/2021    3:32 PM 02/04/2021    3:46 PM 04/03/2020   11:44 AM 01/24/2020    3:04 PM  PHQ 2/9 Scores  PHQ - 2 Score 0 0  0 0 1 0 0  PHQ- 9 Score  '1 2  8  '$ 0    Fall Risk     05/01/2022    3:02 PM 12/04/2021   10:22 AM 06/06/2021   10:46 AM 04/23/2021    3:36 PM 02/04/2021    3:45 PM  Fall Risk   Falls in the past year? 0 0 0 0 0  Number falls in past yr: 0 0 0 0   Injury with Fall? 0 0 0 0   Risk for fall due to : No Fall Risks No Fall Risks No Fall Risks No Fall Risks   Follow up Falls evaluation completed Falls evaluation completed Falls evaluation completed Falls prevention discussed Falls evaluation completed    [place]:  Any stairs in or around the home? No  If so, are there any without handrails? No  Home free of loose throw rugs in walkways, pet beds, electrical cords, etc? Yes  Adequate lighting in your home to reduce risk of falls? Yes   ASSISTIVE DEVICES UTILIZED TO PREVENT FALLS:  Life alert? No  Use of a cane, walker or w/c? No  Grab bars in the bathroom? Yes  Shower chair or bench in shower? No  Elevated toilet seat or a handicapped toilet? No   Cognitive Function:        05/01/2022    3:04 PM 04/03/2020   11:50 AM 04/03/2019   10:47 AM 03/23/2018    3:40 PM 03/15/2017    9:50 AM  6CIT Screen  What Year? 0 points 0 points 0 points 0 points 0 points  What month? 0 points 0 points 0 points 0 points 0 points  What time? 0 points 0 points 0 points 0 points 0 points  Count back from 20 0 points 0 points 0 points 0 points 0 points  Months in reverse 0 points 0 points 0 points 2 points 0 points  Repeat phrase 0 points 0 points 0 points 2 points 2 points  Total Score 0 points 0 points 0 points 4 points 2 points    Immunizations Immunization History  Administered Date(s) Administered   Fluad Quad(high Dose 65+) 06/04/2020, 06/06/2021   Influenza, High Dose Seasonal PF 06/09/2017, 05/12/2018, 05/10/2019   Influenza-Unspecified 05/18/2012, 07/24/2015   PFIZER Comirnaty(Gray Top)Covid-19 Tri-Sucrose Vaccine 03/28/2021   PFIZER(Purple Top)SARS-COV-2 Vaccination 11/06/2019, 11/26/2019, 07/03/2020    Pneumococcal Conjugate-13 01/17/2014   Pneumococcal Polysaccharide-23 02/06/2004, 02/20/2007, 12/16/2011   Tdap 05/11/2014   Zoster, Live 02/04/2010    TDAP status: Up to date  Flu Vaccine status: Up to date  Pneumococcal vaccine status: Up to date  Covid-19 vaccine status: Completed vaccines  Qualifies for Shingles Vaccine? Yes   Zostavax completed Yes   Shingrix Completed?: No.    Education has been provided regarding the importance of this vaccine. Patient has been advised to call insurance company to determine out of pocket expense if they have not yet received this vaccine. Advised may also receive vaccine at local pharmacy or Health Dept. Verbalized acceptance and understanding.  Screening Tests Health Maintenance  Topic Date Due   Zoster Vaccines- Shingrix (1 of 2) Never done   COVID-19 Vaccine (5 - Pfizer series) 05/23/2021   MAMMOGRAM  04/24/2022   INFLUENZA VACCINE  04/07/2022   TETANUS/TDAP  05/11/2024   Pneumonia Vaccine 73+ Years old  Completed   DEXA SCAN  Completed   HPV [place]  Maintenance Due  Topic Date Due   Zoster Vaccines- Shingrix (1 of 2) Never done   COVID-19 Vaccine (5 - Pfizer series) 05/23/2021   MAMMOGRAM  04/24/2022   INFLUENZA VACCINE  04/07/2022    Colorectal cancer screening: No longer required.  Mammogram: scheduled for 05/05/2022  Dexa Scan: 06/11/2021  Lung Cancer Screening: (Low Dose CT Chest recommended if Age 76-80 years, 30 pack-year currently smoking OR have quit w/in 15years.) does not qualify.    Additional Screening:  Hepatitis C Screening: does qualify; Completed   Vision Screening: Recommended annual ophthalmology exams for early detection of glaucoma and other disorders of the eye. Is the patient up to date with their annual eye exam?  Yes  Who is the provider or what is the name of the office in which the patient attends annual eye exams? [facility]  If pt is not  established with a provider, would they like to be referred to a provider to establish care? No .   Dental Screening: Recommended annual dental exams for proper oral hygiene  Community Resource Referral / Chronic Care Management: CRR required this visit?  No   CCM required this visit?  No      Plan:     I have personally reviewed and noted the following in the patient's chart:   Medical and social history Use of alcohol, tobacco or illicit drugs  Current medications and supplements including opioid prescriptions. Patient is not currently taking opioid prescriptions. Functional ability and status Nutritional status Physical activity Advanced directives List of other physicians Hospitalizations, surgeries, and ER visits in previous 12 months Vitals Screenings to include cognitive, depression, and falls Referrals and appointments  In addition, I have reviewed and discussed with patient certain preventive protocols, quality metrics, and best practice recommendations. A written personalized care plan for preventive services as well as general preventive health recommendations were provided to patient.    Ms. Klinker , Thank you for taking time to come for your Medicare Wellness Visit. I appreciate your ongoing commitment to your health goals. Please review the following plan we discussed and let me know if I can assist you in the future.   These are the goals we discussed:  Goals      DIET - INCREASE WATER INTAKE     Recommend to drink at least 6-8 8oz glasses of water per day.     Stay Active and Independent     Why is this important?   Regular activity or exercise is important to managing back pain.  Activity helps to keep your muscles strong.  You will sleep better and feel more relaxed.  You will have more energy and feel less stressed.  If you are not active now, start slowly. Little changes make a big difference.  Rest, but not too much.  Stay as active as you can and  listen to your body's signals.          Weight < 200 lb (90.719 kg)     Patient has lost 10-15 pounds being very active and would like to continue being active in her life as well as keep eating healthy.         This is a list of the screening recommended for you and due dates:  Health Maintenance  Topic Date Due   Zoster (Shingles) Vaccine (1 of 2) Never done   COVID-19 Vaccine (5 - Pfizer series) 05/23/2021   Mammogram  04/24/2022   Flu Shot  04/07/2022  Tetanus Vaccine  05/11/2024   Pneumonia Vaccine  Completed   DEXA scan (bone density measurement)  Completed   HPV Vaccine  Aged [address] [place]   05/01/2022   Nurse Notes: Approximately 30 minute Non-Face-to face Visit

## 2022-05-01 NOTE — Patient Instructions (Signed)

## 2022-05-05 ENCOUNTER — Ambulatory Visit
Admission: RE | Admit: 2022-05-05 | Discharge: 2022-05-05 | Disposition: A | Discharge status: Home / Self Care | Payer: Medicare HMO | Source: Ambulatory Visit | Attending: Internal Medicine | Admitting: Internal Medicine

## 2022-05-05 DIAGNOSIS — Z1231 Encounter for screening mammogram for malignant neoplasm of breast: Secondary | ICD-10-CM | POA: Insufficient documentation

## 2022-05-05 DIAGNOSIS — Z961 Presence of intraocular lens: Secondary | ICD-10-CM | POA: Diagnosis not present

## 2022-05-05 DIAGNOSIS — Z0100 Encounter for examination of eyes and vision without abnormal findings: Secondary | ICD-10-CM | POA: Diagnosis not present

## 2022-06-08 ENCOUNTER — Encounter: Payer: Self-pay | Admitting: Internal Medicine

## 2022-06-08 ENCOUNTER — Ambulatory Visit (INDEPENDENT_AMBULATORY_CARE_PROVIDER_SITE_OTHER): Payer: Medicare HMO | Admitting: Internal Medicine

## 2022-06-08 VITALS — BP 122/60 | HR 64 | Ht 62.0 in | Wt 170.4 lb

## 2022-06-08 DIAGNOSIS — M25472 Effusion, left ankle: Secondary | ICD-10-CM

## 2022-06-08 DIAGNOSIS — J439 Emphysema, unspecified: Secondary | ICD-10-CM | POA: Diagnosis not present

## 2022-06-08 DIAGNOSIS — M48062 Spinal stenosis, lumbar region with neurogenic claudication: Secondary | ICD-10-CM | POA: Diagnosis not present

## 2022-06-08 DIAGNOSIS — E782 Mixed hyperlipidemia: Secondary | ICD-10-CM

## 2022-06-08 DIAGNOSIS — K219 Gastro-esophageal reflux disease without esophagitis: Secondary | ICD-10-CM | POA: Diagnosis not present

## 2022-06-08 DIAGNOSIS — Z0000 Encounter for general adult medical examination without abnormal findings: Secondary | ICD-10-CM | POA: Diagnosis not present

## 2022-06-08 DIAGNOSIS — M25471 Effusion, right ankle: Secondary | ICD-10-CM | POA: Diagnosis not present

## 2022-06-08 DIAGNOSIS — Z23 Encounter for immunization: Secondary | ICD-10-CM

## 2022-06-08 NOTE — Progress Notes (Signed)
Date:  06/08/2022   Name:  Monica Campbell   DOB:  [birth year]   MRN:  [id number]   Chief Complaint: Annual Exam Monica Campbell is a 81 y.o. female who presents today for her Complete Annual Exam. She feels well. She reports exercising. She reports she is sleeping fairly well. Breast complaints - none.  Mammogram: 04/2022 DEXA: 06/2021 osteoporosis hip declined Endo referral Pap smear: discontinued Colonoscopy: 07/2012  Health Maintenance Due  Topic Date Due   Zoster Vaccines- Shingrix (1 of 2) Never done    Immunization History  Administered Date(s) Administered   Fluad Quad(high Dose 65+) 06/04/2020, 06/06/2021, 06/08/2022   Influenza, High Dose Seasonal PF 06/09/2017, 05/12/2018, 05/10/2019   Influenza-Unspecified 05/18/2012, 07/24/2015   PFIZER Comirnaty(Gray Top)Covid-19 Tri-Sucrose Vaccine 03/28/2021   PFIZER(Purple Top)SARS-COV-2 Vaccination 11/06/2019, 11/26/2019, 07/03/2020   PNEUMOCOCCAL CONJUGATE-20 06/08/2022   Pneumococcal Conjugate-13 01/17/2014   Pneumococcal Polysaccharide-23 02/06/2004, 02/20/2007, 12/16/2011   Tdap 05/11/2014   Zoster, Live 02/04/2010    Gastroesophageal Reflux She complains of heartburn. She reports no abdominal pain, no chest pain, no coughing or no wheezing. This is a recurrent problem. The problem occurs occasionally. Pertinent negatives include no fatigue. She has tried a PPI and a histamine-2 antagonist for the symptoms.  Hyperlipidemia This is a chronic problem. The problem is uncontrolled. Associated symptoms include shortness of breath. Pertinent negatives include no chest pain.  Shortness of Breath This is a chronic problem. The problem occurs daily. The problem has been unchanged. Pertinent negatives include no abdominal pain, chest pain, fever, headaches, leg swelling, rash, vomiting or wheezing. She has tried beta agonist inhalers, steroid inhalers and ipratropium inhalers (Trelegy and albuterol) for the symptoms. Her past  medical history is significant for COPD.  Back Pain This is a chronic problem. The problem is unchanged. The pain is present in the lumbar spine. Pertinent negatives include no abdominal pain, chest pain, dysuria, fever or headaches.    Lab Results  Component Value Date   NA 142 06/06/2021   K 4.4 06/06/2021   CO2 24 06/06/2021   GLUCOSE 82 06/06/2021   BUN 10 06/06/2021   CREATININE 0.79 06/06/2021   CALCIUM 9.4 06/06/2021   EGFR 76 06/06/2021   GFRNONAA 71 06/04/2020   Lab Results  Component Value Date   CHOL 267 (H) 06/06/2021   HDL 60 06/06/2021   LDLCALC 176 (H) 06/06/2021   TRIG 167 (H) 06/06/2021   CHOLHDL 4.5 (H) 06/06/2021   Lab Results  Component Value Date   TSH 4.100 06/06/2021   No results found for: "HGBA1C" Lab Results  Component Value Date   WBC 10.7 06/06/2021   HGB 13.9 06/06/2021   HCT 42.1 06/06/2021   MCV 87 06/06/2021   PLT 304 06/06/2021   Lab Results  Component Value Date   ALT 8 06/06/2021   AST 16 06/06/2021   ALKPHOS 64 06/06/2021   BILITOT 0.4 06/06/2021   Lab Results  Component Value Date   VD25OH 39.6 06/06/2021     Review of Systems  Constitutional:  Negative for chills, fatigue and fever.  HENT:  Negative for congestion, hearing loss, tinnitus, trouble swallowing and voice change.   Eyes:  Negative for visual disturbance.  Respiratory:  Positive for shortness of breath. Negative for cough, chest tightness and wheezing.   Cardiovascular:  Negative for chest pain, palpitations and leg swelling.       Small varicose veins and spider veins  Gastrointestinal:  Positive for constipation (occasional hemorrhoids)  and heartburn. Negative for abdominal pain, diarrhea and vomiting.  Endocrine: Negative for polydipsia and polyuria.  Genitourinary:  Negative for dysuria, frequency, genital sores, vaginal bleeding and vaginal discharge.  Musculoskeletal:  Positive for back pain. Negative for arthralgias, gait problem and joint swelling.   Skin:  Negative for color change and rash.       Multiple SK  Neurological:  Negative for dizziness, tremors, light-headedness and headaches.  Hematological:  Negative for adenopathy. Does not bruise/bleed easily.  Psychiatric/Behavioral:  Negative for dysphoric mood and sleep disturbance. The patient is not nervous/anxious.     Patient Active Problem List   Diagnosis Date Noted   Lumbar stenosis with neurogenic claudication 06/04/2020   Ankle edema, bilateral 06/04/2020   Senile ecchymosis 06/04/2020   Arthritis of both acromioclavicular joints 09/05/2019   Bursitis of right shoulder 07/05/2019   Venous insufficiency of right leg 06/21/2019   Myalgia 06/21/2019   Vitamin D deficiency 06/21/2019   Carpal tunnel syndrome 04/10/2019   Acquired trigger finger 04/03/2019   Numbness of hand 04/03/2019   Foot pain, left 12/17/2017   Elevated TSH 05/03/2017   Age-related osteoporosis without current pathological fracture 03/24/2016   Chronic dermatitis 07/09/2015   Allergic rhinitis, seasonal 12/12/2014   Gastro-esophageal reflux disease without esophagitis 12/12/2014   Mixed hyperlipidemia 12/12/2014   Degenerative arthritis of hip 12/12/2014   Chronic obstructive pulmonary disease, unspecified ([place]) 10/11/2014   Bilateral hearing loss 07/15/2014    Allergies  Allergen Reactions   Augmentin [Amoxicillin-Pot Clavulanate] Itching    Itching all over.   Codeine Rash    Past Surgical History:  Procedure Laterality Date   CATARACT EXTRACTION W/PHACO Left 02/02/2018   Procedure: CATARACT EXTRACTION PHACO AND INTRAOCULAR LENS PLACEMENT ([place]) LEFT  MALYUGIN;  Surgeon: Leandrew Koyanagi, MD;  Location: [facility];  Service: Ophthalmology;  Laterality: Left;   CATARACT EXTRACTION W/PHACO Right 05/03/2018   Procedure: CATARACT EXTRACTION PHACO AND INTRAOCULAR LENS PLACEMENT ([place]) RIGHT  IVA/TOPICAL;  Surgeon: Leandrew Koyanagi, MD;  Location: [facility];  Service:  Ophthalmology;  Laterality: Right;   MINOR HEMORRHOIDECTOMY      Social History   Tobacco Use   Smoking status: Former    Packs/day: 0.50    Years: 30.00    Total pack years: 15.00    Types: Cigarettes    Quit date: 09/07/2000    Years since quitting: 21.7   Smokeless tobacco: Never   Tobacco comments:    smoking cessation materials not required  Vaping Use   Vaping Use: Never used  Substance Use Topics   Alcohol use: Yes    Alcohol/week: 1.0 standard drink of alcohol    Types: 1 Glasses of wine per week    Comment: social   Drug use: No     Medication list has been reviewed and updated.  Current Meds  Medication Sig   acetaminophen (TYLENOL) 650 MG CR tablet Take 650 mg by mouth every 8 (eight) hours as needed for pain.   albuterol (PROVENTIL) (2.5 MG/3ML) 0.083% nebulizer solution Take 3 mLs by nebulization every 6 (six) hours as needed for wheezing or shortness of breath.   albuterol (VENTOLIN HFA) 108 (90 Base) MCG/ACT inhaler Inhale 2 puffs into the lungs every 6 (six) hours as needed for wheezing or shortness of breath.   aspirin 81 MG chewable tablet Chew 1 tablet by mouth daily.   atorvastatin (LIPITOR) 20 MG tablet Take 20 mg by mouth daily.   Biotin 1 MG CAPS Take by  mouth.   Calcium Carbonate-Vit D-Min (CALTRATE 600+D PLUS MINERALS) 600-800 MG-UNIT CHEW Chew 1 tablet by mouth daily.   Cholecalciferol (VITAMIN D) 125 MCG (5000 UT) CAPS Take 1 capsule by mouth daily.   docusate sodium (COLACE) 100 MG capsule Take 1 capsule by mouth daily.   esomeprazole (NEXIUM) 40 MG capsule Take 1 capsule (40 mg total) by mouth daily.   famotidine (PEPCID) 20 MG tablet Take 20 mg by mouth as needed.   fluticasone (FLONASE) 50 MCG/ACT nasal spray Use 2 spray(s) in each nostril once daily   Fluticasone-Umeclidin-Vilant (TRELEGY ELLIPTA) 100-62.5-25 MCG/INH AEPB Inhale 1 puff into the lungs daily.   gabapentin (NEURONTIN) 100 MG capsule Take 1 capsule (100 mg total) by mouth at  bedtime.   meloxicam (MOBIC) 15 MG tablet Take 15 mg by mouth daily.   triamterene-hydrochlorothiazide (MAXZIDE-25) 37.5-25 MG tablet Take 1 tablet by mouth daily.   zinc gluconate 50 MG tablet Take 50 mg by mouth daily.       06/08/2022   10:07 AM 12/04/2021   10:22 AM 06/06/2021   10:46 AM 02/04/2021    3:45 PM  GAD 7 : Generalized Anxiety Score  Nervous, Anxious, on Edge 1 0 0 1  Control/stop worrying 0 0 0 1  Worry too much - different things 1 0 0 1  Trouble relaxing 0 0 0 0  Restless 1 0 1 0  Easily annoyed or irritable 0 1 0 0  Afraid - awful might happen 1 1 0 0  Total GAD 7 Score _0 Anxiety Difficulty Not difficult at all  Not difficult at all        06/08/2022   10:06 AM 05/01/2022    2:58 PM 12/04/2021   10:22 AM  Depression screen PHQ 2/9  Decreased Interest 1 0 0  Down, Depressed, Hopeless 0 0 0  PHQ - 2 Score 1 0 0  Altered sleeping 1  0  Tired, decreased energy 1  1  Change in appetite 0  0  Feeling bad or failure about yourself  0  0  Trouble concentrating 1  0  Moving slowly or fidgety/restless 1  0  Suicidal thoughts 0  0  PHQ-9 Score 5  1  Difficult doing work/chores Not difficult at all  Not difficult at all    BP Readings from Last 3 Encounters:  06/08/22 122/60  05/01/22 125/71  12/04/21 132/64    Physical Exam Vitals and nursing note reviewed.  Constitutional:      General: She is not in acute distress.    Appearance: She is well-developed.  HENT:     Head: Normocephalic and atraumatic.     Right Ear: Tympanic membrane and ear canal normal.     Left Ear: Tympanic membrane and ear canal normal.     Nose:     Right Sinus: No maxillary sinus tenderness.     Left Sinus: No maxillary sinus tenderness.  Eyes:     General: No scleral icterus.       Right eye: No discharge.        Left eye: No discharge.     Conjunctiva/sclera: Conjunctivae normal.  Neck:     Thyroid: No thyromegaly.     Vascular: No carotid bruit.  Cardiovascular:      Rate and Rhythm: Normal rate and regular rhythm.     Pulses: Normal pulses.     Heart sounds: Normal heart sounds.  Pulmonary:  Effort: Pulmonary effort is normal. No respiratory distress.     Breath sounds: No wheezing.  Chest:  Breasts:    Right: No mass, nipple discharge, skin change or tenderness.     Left: No mass, nipple discharge, skin change or tenderness.  Abdominal:     General: Bowel sounds are normal.     Palpations: Abdomen is soft.     Tenderness: There is no abdominal tenderness.  Musculoskeletal:        General: Tenderness (right lateral hip) present. Normal range of motion.     Cervical back: Normal range of motion. No erythema.     Right lower leg: No edema.     Left lower leg: No edema.  Lymphadenopathy:     Cervical: No cervical adenopathy.  Skin:    General: Skin is warm and dry.     Capillary Refill: Capillary refill takes less than 2 seconds.     Findings: No rash.  Neurological:     General: No focal deficit present.     Mental Status: She is alert and oriented to person, place, and time.     Cranial Nerves: No cranial nerve deficit.     Sensory: No sensory deficit.     Gait: Gait normal.     Deep Tendon Reflexes: Reflexes are normal and symmetric.  Psychiatric:        Attention and Perception: Attention normal.        Mood and Affect: Mood normal.        Behavior: Behavior normal.     Wt Readings from Last 3 Encounters:  06/08/22 170 lb 6.4 oz (77.3 kg)  12/04/21 174 lb (78.9 kg)  06/06/21 175 lb (79.4 kg)    BP 122/60   Pulse 64   Ht _0  (1.575 m)   Wt 170 lb 6.4 oz (77.3 kg)   SpO2 95%   BMI 31.17 kg/m   Assessment and Plan: 1. Annual physical exam Normal exam for age. Continue regular physical activity and healthy diet  2. Mixed hyperlipidemia On appropriate statin therapy. Will check labs and advise - Lipid panel  3. Gastro-esophageal reflux disease without esophagitis Symptoms well controlled on daily PPI No red  flag signs such as weight loss, n/v, melena Will continue Nexium - CBC with Differential/Platelet  4. Pulmonary emphysema, unspecified emphysema type ([place]) Stable on Trelegy and PRN albuterol Will update Prevnar-20 today  5. Lumbar stenosis with neurogenic claudication Seeing Dr. Sharlet Salina for lumbar injections Having some lateral hip pain that is likely burisitis   6. Ankle edema, bilateral Continue PRN HCTZ - Comprehensive metabolic panel - TSH  7. Need for vaccination for pneumococcus - Pneumococcal conjugate vaccine 20-valent   Partially dictated using Editor, commissioning. Any errors are unintentional.  Halina Maidens, MD [facility]  06/08/2022

## 2022-06-09 LAB — LIPID PANEL
Chol/HDL Ratio: 3.7 ratio (ref 0.0–4.4)
Cholesterol, Total: 238 mg/dL — ABNORMAL HIGH (ref 100–199)
HDL: 65 mg/dL (ref >39)
LDL Chol Calc (NIH): 145 mg/dL — ABNORMAL HIGH (ref 0–99)
Triglycerides: 158 mg/dL — ABNORMAL HIGH (ref 0–149)
VLDL Cholesterol Cal: 28 mg/dL (ref 5–40)

## 2022-06-09 LAB — COMPREHENSIVE METABOLIC PANEL
ALT: 8 IU/L (ref 0–32)
AST: 17 IU/L (ref 0–40)
Albumin/Globulin Ratio: 1.6 (ref 1.2–2.2)
Albumin: 4.3 g/dL (ref 3.7–4.7)
Alkaline Phosphatase: 72 IU/L (ref 44–121)
BUN/Creatinine Ratio: 15 (ref 12–28)
BUN: 11 mg/dL (ref 8–27)
Bilirubin Total: 0.5 mg/dL (ref 0.0–1.2)
CO2: 26 mmol/L (ref 20–29)
Calcium: 9.5 mg/dL (ref 8.7–10.3)
Chloride: 101 mmol/L (ref 96–106)
Creatinine, Ser: 0.74 mg/dL (ref 0.57–1.00)
Globulin, Total: 2.7 g/dL (ref 1.5–4.5)
Glucose: 89 mg/dL (ref 70–99)
Potassium: 4.3 mmol/L (ref 3.5–5.2)
Sodium: 141 mmol/L (ref 134–144)
Total Protein: 7 g/dL (ref 6.0–8.5)
eGFR: 81 mL/min/{1.73_m2} (ref >59)

## 2022-06-09 LAB — CBC WITH DIFFERENTIAL/PLATELET
Basophils Absolute: 0.1 10*3/uL (ref 0.0–0.2)
Basos: 1 %
EOS (ABSOLUTE): 0.1 10*3/uL (ref 0.0–0.4)
Eos: 1 %
Hematocrit: 39.7 % (ref 34.0–46.6)
Hemoglobin: 13.5 g/dL (ref 11.1–15.9)
Immature Grans (Abs): 0 10*3/uL (ref 0.0–0.1)
Immature Granulocytes: 0 %
Lymphocytes Absolute: 3.2 10*3/uL — ABNORMAL HIGH (ref 0.7–3.1)
Lymphs: 37 %
MCH: 29.5 pg (ref 26.6–33.0)
MCHC: 34 g/dL (ref 31.5–35.7)
MCV: 87 fL (ref 79–97)
Monocytes Absolute: 0.8 10*3/uL (ref 0.1–0.9)
Monocytes: 9 %
Neutrophils Absolute: 4.7 10*3/uL (ref 1.4–7.0)
Neutrophils: 52 %
Platelets: 267 10*3/uL (ref 150–450)
RBC: 4.57 x10E6/uL (ref 3.77–5.28)
RDW: 12.5 % (ref 11.7–15.4)
WBC: 8.9 10*3/uL (ref 3.4–10.8)

## 2022-06-09 LAB — TSH: TSH: 3.02 u[IU]/mL (ref 0.450–4.500)

## 2022-07-20 ENCOUNTER — Other Ambulatory Visit: Payer: Self-pay | Admitting: Internal Medicine

## 2022-07-20 DIAGNOSIS — J439 Emphysema, unspecified: Secondary | ICD-10-CM

## 2022-07-21 NOTE — Telephone Encounter (Signed)
Requested medication (s) are due for refill today:yes  Requested medication (s) are on the active medication list: yes  Last refill:  multiple dates  Future visit scheduled:yes  Notes to clinic:  Medication not assigned to a protocol, review manually.  Routing for review.     Requested Prescriptions  Pending Prescriptions Disp Refills   TRELEGY ELLIPTA 100-62.5-25 MCG/ACT AEPB [Pharmacy Med Name: Trelegy Ellipta 100-62.5-25 MCG/INH Inhalation Aerosol Powder Breath Activated] 180 each 0    Sig: Inhale 1 puff by mouth once daily     Off-Protocol Failed - 07/20/2022 11:39 AM      Failed - Medication not assigned to a protocol, review manually.      Passed - Valid encounter within last 12 months    Recent Outpatient Visits           1 month ago Annual physical exam   [place] [facility], Jesse Sans, MD   7 months ago Pulmonary emphysema, unspecified emphysema type [facility])   [place] [facility], Jesse Sans, MD   1 year ago Annual physical exam   [place] [facility] at [facility], Jesse Sans, MD   1 year ago Ankle edema, bilateral   [place] [facility] at [facility], Jesse Sans, MD   2 years ago Annual physical exam   [facility] at [facility], Jesse Sans, MD       Future Appointments             In 4 months Army Melia Jesse Sans, MD [facility] Primary Care and Sports Medicine at [facility], [facility]   In 10 months Army Melia Jesse Sans, MD [facility] Primary Care and Sports Medicine at [facility], [place]             albuterol (VENTOLIN HFA) 108 (90 Base) MCG/ACT inhaler [Pharmacy Med Name: Albuterol Sulfate HFA 108 (90 Base) MCG/ACT Inhalation Aerosol Solution] 54 g 0    Sig: INHALE 2 PUFFS BY MOUTH EVERY 6 HOURS AS NEEDED FOR WHEEZING FOR SHORTNESS OF BREATH      Pulmonology:  Beta Agonists 2 Passed - 07/20/2022 11:39 AM      Passed - Last BP in normal range    BP Readings from Last 1 Encounters:  06/08/22 122/60         Passed - Last Heart Rate in normal range    Pulse Readings from Last 1 Encounters:  06/08/22 64         Passed - Valid encounter within last 12 months    Recent Outpatient Visits           1 month ago Annual physical exam   [place] [facility] at [facility], Jesse Sans, MD   7 months ago Pulmonary emphysema, unspecified emphysema type [facility])   [place] [facility] at [facility], Jesse Sans, MD   1 year ago Annual physical exam   [place] [facility] at [facility], Jesse Sans, MD   1 year ago Ankle edema, bilateral   [place] [facility] and [facility] at [facility], Jesse Sans, MD   2 years ago Annual physical exam   [facility] Primary Care and [facility] at [facility], Jesse Sans, MD  Future Appointments             In 4 months Army Melia, Jesse Sans, MD [facility] Health Primary Care and Sports Medicine at [facility], [facility]   In 10 months Army Melia Jesse Sans, MD [place] [facility] and [facility] Medicine at [facility], [facility]             fluticasone [facility]) 50 MCG/ACT nasal spray [Pharmacy Med Name: Fluticasone Propionate 50 MCG/ACT Nasal Suspension] 48 g 0    Sig: Use 2 spray(s) in each nostril once daily     Ear, Nose, and Throat: Nasal Preparations - Corticosteroids Passed - 07/20/2022 11:39 AM      Passed - Valid encounter within last 12 months    Recent Outpatient Visits           1 month ago Annual physical exam   [place] [facility] at [facility], Jesse Sans, MD   7 months ago Pulmonary emphysema, unspecified emphysema type [facility])   [place] [facility] at [facility],  Jesse Sans, MD   1 year ago Annual physical exam   [place] [facility] at [facility], Jesse Sans, MD   1 year ago Ankle edema, bilateral   [place] [facility] at [facility], Jesse Sans, MD   2 years ago Annual physical exam   [facility] Primary Care and [facility] at [facility], Jesse Sans, MD       Future Appointments             In 4 months Army Melia, Jesse Sans, MD [facility] Health Primary Care and Sports Medicine at [facility], [facility]   In 10 months Army Melia, Jesse Sans, MD [place] [facility] Care and Sports Medicine at [facility], [facility]

## 2022-07-23 ENCOUNTER — Other Ambulatory Visit: Payer: Self-pay | Admitting: Internal Medicine

## 2022-07-23 DIAGNOSIS — J439 Emphysema, unspecified: Secondary | ICD-10-CM

## 2022-07-23 NOTE — Telephone Encounter (Signed)
Requested Prescriptions  Pending Prescriptions Disp Refills   TRELEGY ELLIPTA 100-62.5-25 MCG/ACT AEPB [Pharmacy Med Name: Trelegy Ellipta 100-62.5-25 MCG/INH Inhalation Aerosol Powder Breath Activated] 180 each 0    Sig: Inhale 1 puff by mouth once daily     Off-Protocol Failed - 07/23/2022  5:12 PM      Failed - Medication not assigned to a protocol, review manually.      Passed - Valid encounter within last 12 months    Recent Outpatient Visits           1 month ago Annual physical exam   [place] [facility] at [facility], Jesse Sans, MD   7 months ago Pulmonary emphysema, unspecified emphysema type [facility])   [place] [facility] at [facility] Jesse Sans, MD   1 year ago Annual physical exam   [place] [facility] Medicine at [facility], Jesse Sans, MD   1 year ago Ankle edema, bilateral   [place] [facility] at [facility], Jesse Sans, MD   2 years ago Annual physical exam   [facility] Primary Care and [facility] at [facility], Jesse Sans, MD       Future Appointments             In 4 months Army Melia Jesse Sans, MD [facility] Health Primary Care and Sports Medicine at [facility], [facility]   In 10 months Army Melia, Jesse Sans, MD [facility] Primary Care and Sports Medicine at [facility], [facility]             albuterol (VENTOLIN HFA) 108 (90 Base) MCG/ACT inhaler [Pharmacy Med Name: Albuterol Sulfate HFA 108 (90 Base) MCG/ACT Inhalation Aerosol Solution] 54 g 0    Sig: INHALE 2 PUFFS BY MOUTH EVERY 6 HOURS AS NEEDED FOR WHEEZING FOR SHORTNESS OF BREATH     Pulmonology:  Beta Agonists 2 Passed - 07/23/2022  5:12 PM      Passed - Last BP in normal range    BP Readings from Last 1 Encounters:  06/08/22 122/60         Passed - Last Heart Rate in normal range    Pulse Readings from Last 1 Encounters:  06/08/22 64          Passed - Valid encounter within last 12 months    Recent Outpatient Visits           1 month ago Annual physical exam   [place] [facility] at [facility], Jesse Sans, MD   7 months ago Pulmonary emphysema, unspecified emphysema type [facility])   [place] [facility] at [facility], Jesse Sans, MD   1 year ago Annual physical exam   [place] [facility] at [facility], Jesse Sans, MD   1 year ago Ankle edema, bilateral    Primary Care and Sports Medicine at [facility], Jesse Sans, MD   2 years ago Annual physical exam   [facility] at [facility], Jesse Sans, MD       Future Appointments             In 4 months Army Melia, Jesse Sans, MD [facility] Health Primary Care and Sports Medicine at [facility], [facility]   In 10 months Army Melia Jesse Sans, MD [place]  Primary Care and Sports Medicine at [facility], [facility]

## 2022-07-23 NOTE — Telephone Encounter (Signed)
Requested medication (s) are due for refill today: yes  Requested medication (s) are on the active medication list: yes    Last refill: 06/06/21  #60 5 refills  Future visit scheduled yes 12/08/22  Notes to clinic:Off protocol, please review. Thank you.  Requested Prescriptions  Pending Prescriptions Disp Refills   TRELEGY ELLIPTA 100-62.5-25 MCG/ACT AEPB [Pharmacy Med Name: Trelegy Ellipta 100-62.5-25 MCG/INH Inhalation Aerosol Powder Breath Activated] 180 each 0    Sig: Inhale 1 puff by mouth once daily     Off-Protocol Failed - 07/23/2022  5:12 PM      Failed - Medication not assigned to a protocol, review manually.      Passed - Valid encounter within last 12 months    Recent Outpatient Visits           1 month ago Annual physical exam   [place] [facility] at [facility], Jesse Sans, MD   7 months ago Pulmonary emphysema, unspecified emphysema type [facility])   [place] [facility] at [facility], Jesse Sans, MD   1 year ago Annual physical exam   [place] [facility] at [facility], Jesse Sans, MD   1 year ago Ankle edema, bilateral   [place] [facility] at [facility], Jesse Sans, MD   2 years ago Annual physical exam   [facility] Primary Care and Sports Medicine at [facility], Jesse Sans, MD       Future Appointments             In 4 months Army Melia Jesse Sans, MD [facility] Primary Care and [facility] at [facility]   In 10 months Glean Hess, MD [facility] Primary Care and Sports Medicine at [facility], [facility]            Signed Prescriptions Disp Refills   albuterol (VENTOLIN HFA) 108 (90 Base) MCG/ACT inhaler 54 g 0    Sig: INHALE 2 PUFFS BY MOUTH EVERY 6 HOURS AS NEEDED FOR WHEEZING FOR SHORTNESS OF BREATH     Pulmonology:  Beta Agonists 2 Passed - 07/23/2022  5:12 PM      Passed -  Last BP in normal range    BP Readings from Last 1 Encounters:  06/08/22 122/60         Passed - Last Heart Rate in normal range    Pulse Readings from Last 1 Encounters:  06/08/22 64         Passed - Valid encounter within last 12 months    Recent Outpatient Visits           1 month ago Annual physical exam   [place] [facility] at [facility], Jesse Sans, MD   7 months ago Pulmonary emphysema, unspecified emphysema type [facility])   [place] [facility] and [facility] at [facility], Jesse Sans, MD   1 year ago Annual physical exam   [place] [facility] at [facility], Jesse Sans, MD   1 year ago Ankle edema, bilateral   [place] [facility] and Sports [facility] at [facility], Jesse Sans, MD   2 years ago Annual physical exam   [facility] and [facility] at [facility], Jesse Sans, MD       Future Appointments  In 4 months Army Melia, Jesse Sans, MD [facility] Primary Care and Sports Medicine at [facility]   In 10 months Army Melia, Jesse Sans, MD [facility] Care and Sports Medicine at [facility], [facility]

## 2022-08-19 DIAGNOSIS — M7551 Bursitis of right shoulder: Secondary | ICD-10-CM | POA: Diagnosis not present

## 2022-08-19 DIAGNOSIS — M542 Cervicalgia: Secondary | ICD-10-CM | POA: Diagnosis not present

## 2022-08-19 DIAGNOSIS — M50322 Other cervical disc degeneration at C5-C6 level: Secondary | ICD-10-CM | POA: Diagnosis not present

## 2022-08-26 ENCOUNTER — Telehealth: Payer: Self-pay | Admitting: Internal Medicine

## 2022-08-26 NOTE — Telephone Encounter (Signed)
Patient called wanting to know the results from an x-ray she had done 08/19/22 at  [facility]. Advised patient that results were unable to be seen in the [facility] system. Advised patient to reach put to the [place] office for results. Patient verb understanding.

## 2022-09-11 DIAGNOSIS — M5441 Lumbago with sciatica, right side: Secondary | ICD-10-CM | POA: Diagnosis not present

## 2022-09-11 DIAGNOSIS — M48062 Spinal stenosis, lumbar region with neurogenic claudication: Secondary | ICD-10-CM | POA: Diagnosis not present

## 2022-09-11 DIAGNOSIS — M25551 Pain in right hip: Secondary | ICD-10-CM | POA: Diagnosis not present

## 2022-09-11 DIAGNOSIS — G8929 Other chronic pain: Secondary | ICD-10-CM | POA: Diagnosis not present

## 2022-09-14 DIAGNOSIS — M65312 Trigger thumb, left thumb: Secondary | ICD-10-CM | POA: Diagnosis not present

## 2022-09-14 DIAGNOSIS — M79645 Pain in left finger(s): Secondary | ICD-10-CM | POA: Diagnosis not present

## 2022-09-25 DIAGNOSIS — M25551 Pain in right hip: Secondary | ICD-10-CM | POA: Diagnosis not present

## 2022-09-25 DIAGNOSIS — G8929 Other chronic pain: Secondary | ICD-10-CM | POA: Diagnosis not present

## 2022-11-03 ENCOUNTER — Other Ambulatory Visit: Payer: Self-pay | Admitting: Internal Medicine

## 2022-11-03 DIAGNOSIS — J439 Emphysema, unspecified: Secondary | ICD-10-CM

## 2022-11-03 DIAGNOSIS — K219 Gastro-esophageal reflux disease without esophagitis: Secondary | ICD-10-CM | POA: Diagnosis not present

## 2022-11-03 DIAGNOSIS — H903 Sensorineural hearing loss, bilateral: Secondary | ICD-10-CM | POA: Diagnosis not present

## 2022-11-03 DIAGNOSIS — J300 Vasomotor rhinitis: Secondary | ICD-10-CM | POA: Diagnosis not present

## 2022-11-03 DIAGNOSIS — M542 Cervicalgia: Secondary | ICD-10-CM | POA: Diagnosis not present

## 2022-11-04 ENCOUNTER — Telehealth: Payer: Self-pay

## 2022-11-04 NOTE — Telephone Encounter (Signed)
Patient is requesting refill on Gabapentin but we have not gave her this medication since July of 2023. Please call her to schedule an appointment to discuss gabapentin with Dr Army Melia.  Thank you,  Monica Campbell

## 2022-11-04 NOTE — Telephone Encounter (Signed)
Requested medication (s) are due for refill today:   Yes for both  Requested medication (s) are on the active medication list:   Yes for both  Future visit scheduled:   Yes   Last ordered: Trelegy 07/24/2022 180 each, 0 refills;  Albuterol inhaler 07/23/2022 54 g, 0 refills  Returned because there isn't a protocol assigned to the Trelegy.      Requested Prescriptions  Pending Prescriptions Disp Refills   TRELEGY ELLIPTA 100-62.5-25 MCG/ACT AEPB [Pharmacy Med Name: Trelegy Ellipta 100-62.5-25 MCG/INH Inhalation Aerosol Powder Breath Activated] 180 each 0    Sig: Inhale 1 puff by mouth once daily     Off-Protocol Failed - 11/03/2022 12:26 PM      Failed - Medication not assigned to a protocol, review manually.      Passed - Valid encounter within last 12 months    Recent Outpatient Visits           4 months ago Annual physical exam   [place] [facility] at [facility], Jesse Sans, MD   11 months ago Pulmonary emphysema, unspecified emphysema type [facility])   [place] [facility] at [facility], Jesse Sans, MD   1 year ago Annual physical exam   [facility] Primary Care & Sports Medicine at [facility], Jesse Sans, MD   1 year ago Ankle edema, bilateral   [place] Primary [place] at [facility], Jesse Sans, MD   2 years ago Annual physical exam   [place] at [facility], Jesse Sans, MD       Future Appointments             In 1 month Army Melia Jesse Sans, MD [place] at [facility], [facility]   In 7 months Army Melia, Jesse Sans, MD [place] at [place], [facility]             albuterol (VENTOLIN HFA) 108 (90 Base) MCG/ACT inhaler [Pharmacy Med Name: Albuterol Sulfate HFA 108 (90 Base) MCG/ACT Inhalation Aerosol Solution] 54 g 0    Sig: INHALE 2 PUFFS BY MOUTH EVERY 6  HOURS AS NEEDED FOR WHEEZING FOR SHORTNESS OF BREATH     Pulmonology:  Beta Agonists 2 Passed - 11/03/2022 12:26 PM      Passed - Last BP in normal range    BP Readings from Last 1 Encounters:  06/08/22 122/60         Passed - Last Heart Rate in normal range    Pulse Readings from Last 1 Encounters:  06/08/22 64         Passed - Valid encounter within last 12 months    Recent Outpatient Visits           4 months ago Annual physical exam   [place] at [facility], Jesse Sans, MD   11 months ago Pulmonary emphysema, unspecified emphysema type [facility])   [place] at [facility], Jesse Sans, MD   1 year ago Annual physical exam   [place] [facility] Primary Care & Sports [facility] at [facility], Jesse Sans, MD   1 year ago Ankle edema, bilateral   [facility] Primary [place] at [facility], Jesse Sans, MD   2 years ago Annual  physical exam   [facility] at [facility], Jesse Sans, MD       Future Appointments             In 1 month Army Melia, Jesse Sans, MD [place] at [facility], [facility]   In 7 months Army Melia, Jesse Sans, MD [place] at [facility], [facility]

## 2022-12-08 ENCOUNTER — Ambulatory Visit: Payer: Medicare HMO | Admitting: Internal Medicine

## 2022-12-29 ENCOUNTER — Other Ambulatory Visit: Payer: Self-pay

## 2022-12-29 ENCOUNTER — Ambulatory Visit (INDEPENDENT_AMBULATORY_CARE_PROVIDER_SITE_OTHER): Payer: Medicare HMO | Admitting: Internal Medicine

## 2022-12-29 ENCOUNTER — Telehealth: Payer: Self-pay | Admitting: Internal Medicine

## 2022-12-29 ENCOUNTER — Encounter: Payer: Self-pay | Admitting: Internal Medicine

## 2022-12-29 VITALS — BP 122/68 | HR 73 | Ht 62.0 in | Wt 168.0 lb

## 2022-12-29 DIAGNOSIS — M5441 Lumbago with sciatica, right side: Secondary | ICD-10-CM

## 2022-12-29 DIAGNOSIS — M48062 Spinal stenosis, lumbar region with neurogenic claudication: Secondary | ICD-10-CM

## 2022-12-29 DIAGNOSIS — K219 Gastro-esophageal reflux disease without esophagitis: Secondary | ICD-10-CM

## 2022-12-29 DIAGNOSIS — E782 Mixed hyperlipidemia: Secondary | ICD-10-CM

## 2022-12-29 DIAGNOSIS — M5442 Lumbago with sciatica, left side: Secondary | ICD-10-CM

## 2022-12-29 DIAGNOSIS — M25471 Effusion, right ankle: Secondary | ICD-10-CM

## 2022-12-29 DIAGNOSIS — J439 Emphysema, unspecified: Secondary | ICD-10-CM | POA: Diagnosis not present

## 2022-12-29 DIAGNOSIS — Z1231 Encounter for screening mammogram for malignant neoplasm of breast: Secondary | ICD-10-CM

## 2022-12-29 DIAGNOSIS — M25472 Effusion, left ankle: Secondary | ICD-10-CM | POA: Diagnosis not present

## 2022-12-29 MED ORDER — BLOOD GLUCOSE MONITOR KIT: PACK | 0 refills | Status: AC

## 2022-12-29 MED ORDER — GABAPENTIN 100 MG PO CAPS: 100.0000 mg | ORAL_CAPSULE | Freq: Every day | ORAL | 0 refills | Status: DC

## 2022-12-29 NOTE — Telephone Encounter (Signed)
Rx sent to [facility]. - Monica Campbell

## 2022-12-29 NOTE — Assessment & Plan Note (Signed)
Taking gabapentin for lower back pain She is getting some benefit and no definite side effects

## 2022-12-29 NOTE — Patient Instructions (Signed)
Call [facility] to schedule your mammogram at [phone]. - due in August

## 2022-12-29 NOTE — Assessment & Plan Note (Signed)
Previously on lipitor but stopped medications when she changed her diet and lost weight

## 2022-12-29 NOTE — Assessment & Plan Note (Addendum)
COPD stable on Trelegy every day. Use Albuterol for exercise as needed

## 2022-12-29 NOTE — Assessment & Plan Note (Signed)
Much improved with weight loss and sodium restriction Continue Maxzide PRN

## 2022-12-29 NOTE — Assessment & Plan Note (Addendum)
Symptoms well controlled on prn TUMS No red flag signs such as weight loss, n/v, melena

## 2022-12-29 NOTE — Progress Notes (Signed)
Date:  12/29/2022   Name:  Monica Campbell   DOB:  Apr 29, 1941   MRN:  [id number]   Chief Complaint: COPD and Gastroesophageal Reflux  COPD She complains of cough and shortness of breath. There is no wheezing. This is a chronic problem. Pertinent negatives include no chest pain or headaches. Her past medical history is significant for COPD.  Gastroesophageal Reflux She complains of coughing. She reports no abdominal pain, no chest pain or no wheezing. This is a recurrent problem. The problem occurs rarely. Pertinent negatives include no fatigue.  Back Pain This is a chronic problem. The problem is unchanged. The pain is present in the lumbar spine. The quality of the pain is described as aching. The pain does not radiate. Pertinent negatives include no abdominal pain, chest pain, dysuria, headaches or weakness.    Lab Results  Component Value Date   NA 141 06/08/2022   K 4.3 06/08/2022   CO2 26 06/08/2022   GLUCOSE 89 06/08/2022   BUN 11 06/08/2022   CREATININE 0.74 06/08/2022   CALCIUM 9.5 06/08/2022   EGFR 81 06/08/2022   GFRNONAA 71 06/04/2020   Lab Results  Component Value Date   CHOL 238 (H) 06/08/2022   HDL 65 06/08/2022   LDLCALC 145 (H) 06/08/2022   TRIG 158 (H) 06/08/2022   CHOLHDL 3.7 06/08/2022   Lab Results  Component Value Date   TSH 3.020 06/08/2022   No results found for: "HGBA1C" Lab Results  Component Value Date   WBC 8.9 06/08/2022   HGB 13.5 06/08/2022   HCT 39.7 06/08/2022   MCV 87 06/08/2022   PLT 267 06/08/2022   Lab Results  Component Value Date   ALT 8 06/08/2022   AST 17 06/08/2022   ALKPHOS 72 06/08/2022   BILITOT 0.5 06/08/2022   Lab Results  Component Value Date   VD25OH 39.6 06/06/2021     Review of Systems  Constitutional:  Negative for fatigue and unexpected weight change.  HENT:  Negative for nosebleeds.   Eyes:  Negative for visual disturbance.  Respiratory:  Positive for cough and shortness of breath. Negative for  chest tightness and wheezing.   Cardiovascular:  Negative for chest pain, palpitations and leg swelling.  Gastrointestinal:  Negative for abdominal pain, constipation and diarrhea.  Genitourinary:  Positive for frequency and urgency. Negative for dysuria.  Musculoskeletal:  Positive for back pain.  Neurological:  Negative for dizziness, weakness, light-headedness and headaches.  Psychiatric/Behavioral:  Negative for dysphoric mood and sleep disturbance. The patient is not nervous/anxious.     Patient Active Problem List   Diagnosis Date Noted   Lumbar stenosis with neurogenic claudication 06/04/2020   Ankle edema, bilateral 06/04/2020   Senile ecchymosis 06/04/2020   Arthritis of both acromioclavicular joints 09/05/2019   Bursitis of right shoulder 07/05/2019   Venous insufficiency of right leg 06/21/2019   Myalgia 06/21/2019   Vitamin D deficiency 06/21/2019   Carpal tunnel syndrome 04/10/2019   Acquired trigger finger 04/03/2019   Numbness of hand 04/03/2019   Foot pain, left 12/17/2017   Elevated TSH 05/03/2017   Age-related osteoporosis without current pathological fracture 03/24/2016   Chronic dermatitis 07/09/2015   Allergic rhinitis, seasonal 12/12/2014   Gastro-esophageal reflux disease without esophagitis 12/12/2014   Mixed hyperlipidemia 12/12/2014   Degenerative arthritis of hip 12/12/2014   Chronic obstructive pulmonary disease, unspecified 10/11/2014   Bilateral hearing loss 07/15/2014    Allergies  Allergen Reactions   Augmentin [Amoxicillin-Pot Clavulanate] Itching  Itching all over.   Codeine Rash    Past Surgical History:  Procedure Laterality Date   CATARACT EXTRACTION W/PHACO Left 02/02/2018   Procedure: CATARACT EXTRACTION PHACO AND INTRAOCULAR LENS PLACEMENT (IOC) LEFT  MALYUGIN;  Surgeon: Lockie Mola, MD;  [facility];  Service: Ophthalmology;  Laterality: Left;   CATARACT EXTRACTION W/PHACO Right 05/03/2018   Procedure:  CATARACT EXTRACTION PHACO AND INTRAOCULAR LENS PLACEMENT (IOC) RIGHT  IVA/TOPICAL;  Surgeon: Lockie Mola, MD;  [facility];  Service: Ophthalmology;  Laterality: Right;   MINOR HEMORRHOIDECTOMY      Social History   Tobacco Use   Smoking status: Former    Packs/day: 0.50    Years: 30.00    Additional pack years: 0.00    Total pack years: 15.00    Types: Cigarettes    Quit date: 09/07/2000    Years since quitting: 22.3   Smokeless tobacco: Never   Tobacco comments:    smoking cessation materials not required  Vaping Use   Vaping Use: Never used  Substance Use Topics   Alcohol use: Yes    Alcohol/week: 1.0 standard drink of alcohol    Types: 1 Glasses of wine per week    Comment: social   Drug use: No     Medication list has been reviewed and updated.  Current Meds  Medication Sig   acetaminophen (TYLENOL) 650 MG CR tablet Take 650 mg by mouth every 8 (eight) hours as needed for pain.   albuterol (PROVENTIL) (2.5 MG/3ML) 0.083% nebulizer solution Take 3 mLs by nebulization every 6 (six) hours as needed for wheezing or shortness of breath.   albuterol (VENTOLIN HFA) 108 (90 Base) MCG/ACT inhaler INHALE 2 PUFFS BY MOUTH EVERY 6 HOURS AS NEEDED FOR WHEEZING FOR SHORTNESS OF BREATH   aspirin 81 MG chewable tablet Chew 1 tablet by mouth daily.   Biotin 1 MG CAPS Take by mouth.   Calcium Carbonate-Vit D-Min (CALTRATE 600+D PLUS MINERALS) 600-800 MG-UNIT CHEW Chew 1 tablet by mouth daily.   Cholecalciferol (VITAMIN D) 125 MCG (5000 UT) CAPS Take 1 capsule by mouth daily.   docusate sodium (COLACE) 100 MG capsule Take 1 capsule by mouth daily.   fluticasone (FLONASE) 50 MCG/ACT nasal spray Use 2 spray(s) in each nostril once daily   meloxicam (MOBIC) 15 MG tablet Take 15 mg by mouth daily.   TRELEGY ELLIPTA 100-62.5-25 MCG/ACT AEPB Inhale 1 puff by mouth once daily   zinc gluconate 50 MG tablet Take 50 mg by mouth daily.   [DISCONTINUED] esomeprazole  (NEXIUM) 40 MG capsule Take 1 capsule (40 mg total) by mouth daily.   [DISCONTINUED] famotidine (PEPCID) 20 MG tablet Take 20 mg by mouth as needed.   [DISCONTINUED] gabapentin (NEURONTIN) 100 MG capsule Take 1 capsule (100 mg total) by mouth at bedtime.       12/29/2022    3:24 PM 06/08/2022   10:07 AM 12/04/2021   10:22 AM 06/06/2021   10:46 AM  GAD 7 : Generalized Anxiety Score  Nervous, Anxious, on Edge 1 1 0 0  Control/stop worrying 1 0 0 0  Worry too much - different things 1 1 0 0  Trouble relaxing 1 0 0 0  Restless 0 1 0 1  Easily annoyed or irritable 1 0 1 0  Afraid - awful might happen 1 1 1  0  Total GAD 7 Score 6 4 2 1   Anxiety Difficulty Somewhat difficult Not difficult at all  Not difficult at  all       12/29/2022    3:24 PM 06/08/2022   10:06 AM 05/01/2022    2:58 PM  Depression screen PHQ 2/9  Decreased Interest 1 1 0  Down, Depressed, Hopeless 0 0 0  PHQ - 2 Score 1 1 0  Altered sleeping 0 1   Tired, decreased energy 1 1   Change in appetite 0 0   Feeling bad or failure about yourself  0 0   Trouble concentrating 0 1   Moving slowly or fidgety/restless 0 1   Suicidal thoughts 0 0   PHQ-9 Score 2 5   Difficult doing work/chores Not difficult at all Not difficult at all     BP Readings from Last 3 Encounters:  12/29/22 122/68  06/08/22 122/60  05/01/22 125/71    Physical Exam Vitals and nursing note reviewed.  Constitutional:      General: She is not in acute distress.    Appearance: Normal appearance. She is well-developed.  HENT:     Head: Normocephalic and atraumatic.  Cardiovascular:     Rate and Rhythm: Normal rate and regular rhythm.     Heart sounds: No murmur heard. Pulmonary:     Effort: Pulmonary effort is normal. No respiratory distress.     Breath sounds: Decreased breath sounds present. No wheezing or rhonchi.  Musculoskeletal:     Cervical back: Normal range of motion.  Lymphadenopathy:     Cervical: No cervical adenopathy.   Skin:    General: Skin is warm and dry.     Findings: No rash.  Neurological:     Mental Status: She is alert and oriented to person, place, and time.  Psychiatric:        Mood and Affect: Mood normal.        Behavior: Behavior normal.     Wt Readings from Last 3 Encounters:  12/29/22 168 lb (76.2 kg)  06/08/22 170 lb 6.4 oz (77.3 kg)  12/04/21 174 lb (78.9 kg)    BP 122/68   Pulse 73   Ht  (1.575 m)   Wt 168 lb (76.2 kg)   SpO2 96%   BMI 30.73 kg/m   Assessment and Plan:  Problem List Items Addressed This Visit       Respiratory   Chronic obstructive pulmonary disease, unspecified (Chronic)    COPD stable on Trelegy every day. Use Albuterol for exercise as needed        Digestive   Gastro-esophageal reflux disease without esophagitis - Primary (Chronic)    Symptoms well controlled on prn TUMS No red flag signs such as weight loss, n/v, melena       Relevant Orders   CBC with Differential/Platelet     Other   Ankle edema, bilateral (Chronic)    Much improved with weight loss and sodium restriction Continue Maxzide PRN      Lumbar stenosis with neurogenic claudication (Chronic)    Taking gabapentin for lower back pain She is getting some benefit and no definite side effects      Relevant Medications   gabapentin (NEURONTIN) 100 MG capsule   Mixed hyperlipidemia (Chronic)    Previously on lipitor but stopped medications when she changed her diet and lost weight      Relevant Orders   Lipid panel   Comprehensive metabolic panel   Other Visit Diagnoses     Low back pain due to bilateral sciatica       Relevant Medications  gabapentin (NEURONTIN) 100 MG capsule   Encounter for screening mammogram for breast cancer       Relevant Orders   MM 3D SCREENING MAMMOGRAM BILATERAL BREAST       No follow-ups on file.   Partially dictated using Dragon software, any errors are not intentional.  Reubin Milan, MD [facility] Primary Care  and Sports Medicine [place], [place]

## 2022-12-29 NOTE — Telephone Encounter (Signed)
Patient came back into the office and stated that she forgot to tell Dr. Judithann Graves that she needed another Blood Glucose Monitor, and wanted to see if she could order her one.

## 2022-12-30 LAB — COMPREHENSIVE METABOLIC PANEL
ALT: 8 IU/L (ref 0–32)
AST: 15 IU/L (ref 0–40)
Albumin/Globulin Ratio: 1.5 (ref 1.2–2.2)
Albumin: 4.3 g/dL (ref 3.7–4.7)
Alkaline Phosphatase: 73 IU/L (ref 44–121)
BUN/Creatinine Ratio: 20 (ref 12–28)
BUN: 18 mg/dL (ref 8–27)
Bilirubin Total: 0.2 mg/dL (ref 0.0–1.2)
CO2: 25 mmol/L (ref 20–29)
Calcium: 9.5 mg/dL (ref 8.7–10.3)
Chloride: 102 mmol/L (ref 96–106)
Creatinine, Ser: 0.89 mg/dL (ref 0.57–1.00)
Globulin, Total: 2.8 g/dL (ref 1.5–4.5)
Glucose: 89 mg/dL (ref 70–99)
Potassium: 4.4 mmol/L (ref 3.5–5.2)
Sodium: 142 mmol/L (ref 134–144)
Total Protein: 7.1 g/dL (ref 6.0–8.5)
eGFR: 65 mL/min/{1.73_m2} (ref >59)

## 2022-12-30 LAB — CBC WITH DIFFERENTIAL/PLATELET
Basophils Absolute: 0.1 10*3/uL (ref 0.0–0.2)
Basos: 1 %
EOS (ABSOLUTE): 0.2 10*3/uL (ref 0.0–0.4)
Eos: 2 %
Hematocrit: 40.1 % (ref 34.0–46.6)
Hemoglobin: 13.4 g/dL (ref 11.1–15.9)
Immature Grans (Abs): 0 10*3/uL (ref 0.0–0.1)
Immature Granulocytes: 0 %
Lymphocytes Absolute: 4.4 10*3/uL — ABNORMAL HIGH (ref 0.7–3.1)
Lymphs: 41 %
MCH: 30 pg (ref 26.6–33.0)
MCHC: 33.4 g/dL (ref 31.5–35.7)
MCV: 90 fL (ref 79–97)
Monocytes Absolute: 0.8 10*3/uL (ref 0.1–0.9)
Monocytes: 7 %
Neutrophils Absolute: 5.1 10*3/uL (ref 1.4–7.0)
Neutrophils: 49 %
Platelets: 279 10*3/uL (ref 150–450)
RBC: 4.47 x10E6/uL (ref 3.77–5.28)
RDW: 12.4 % (ref 11.7–15.4)
WBC: 10.5 10*3/uL (ref 3.4–10.8)

## 2022-12-30 LAB — LIPID PANEL
Chol/HDL Ratio: 3.7 ratio (ref 0.0–4.4)
Cholesterol, Total: 254 mg/dL — ABNORMAL HIGH (ref 100–199)
HDL: 68 mg/dL (ref >39)
LDL Chol Calc (NIH): 168 mg/dL — ABNORMAL HIGH (ref 0–99)
Triglycerides: 106 mg/dL (ref 0–149)
VLDL Cholesterol Cal: 18 mg/dL (ref 5–40)

## 2023-01-14 ENCOUNTER — Other Ambulatory Visit: Payer: Self-pay | Admitting: Internal Medicine

## 2023-01-14 DIAGNOSIS — J439 Emphysema, unspecified: Secondary | ICD-10-CM

## 2023-01-27 ENCOUNTER — Other Ambulatory Visit: Payer: Self-pay | Admitting: Internal Medicine

## 2023-01-27 DIAGNOSIS — J439 Emphysema, unspecified: Secondary | ICD-10-CM

## 2023-02-03 ENCOUNTER — Telehealth: Payer: Self-pay | Admitting: Internal Medicine

## 2023-02-03 ENCOUNTER — Other Ambulatory Visit: Payer: Self-pay

## 2023-02-03 DIAGNOSIS — J439 Emphysema, unspecified: Secondary | ICD-10-CM

## 2023-02-03 NOTE — Progress Notes (Signed)
pulmono

## 2023-02-03 NOTE — Telephone Encounter (Signed)
Copied from CRM [id number]. Topic: Referral - Request for Referral >> Feb 03, 2023 10:19 AM Epimenio Foot F wrote: Has patient seen PCP for this complaint? Yes.   *If NO, is insurance requiring patient see PCP for this issue before PCP can refer them? Referral for which specialty: Pulmonology  Preferred provider/office: Dr. Carmelina Paddock  Reason for referral: Check-Up  Pt was told by the referring office she needed a referral sent over. Pt would like someone to call her when this completed

## 2023-02-03 NOTE — Telephone Encounter (Signed)
Called and informed patient referral was placed.  - Natash Berman

## 2023-02-04 DIAGNOSIS — Z872 Personal history of diseases of the skin and subcutaneous tissue: Secondary | ICD-10-CM | POA: Diagnosis not present

## 2023-02-04 DIAGNOSIS — L720 Epidermal cyst: Secondary | ICD-10-CM | POA: Diagnosis not present

## 2023-02-04 DIAGNOSIS — L578 Other skin changes due to chronic exposure to nonionizing radiation: Secondary | ICD-10-CM | POA: Diagnosis not present

## 2023-02-04 DIAGNOSIS — L821 Other seborrheic keratosis: Secondary | ICD-10-CM | POA: Diagnosis not present

## 2023-02-04 DIAGNOSIS — Z85828 Personal history of other malignant neoplasm of skin: Secondary | ICD-10-CM | POA: Diagnosis not present

## 2023-02-08 DIAGNOSIS — R0609 Other forms of dyspnea: Secondary | ICD-10-CM | POA: Diagnosis not present

## 2023-02-08 DIAGNOSIS — J449 Chronic obstructive pulmonary disease, unspecified: Secondary | ICD-10-CM | POA: Diagnosis not present

## 2023-02-08 DIAGNOSIS — R058 Other specified cough: Secondary | ICD-10-CM | POA: Diagnosis not present

## 2023-02-08 DIAGNOSIS — R0600 Dyspnea, unspecified: Secondary | ICD-10-CM | POA: Diagnosis not present

## 2023-02-08 DIAGNOSIS — R059 Cough, unspecified: Secondary | ICD-10-CM | POA: Diagnosis not present

## 2023-03-30 ENCOUNTER — Other Ambulatory Visit: Payer: Self-pay | Admitting: Internal Medicine

## 2023-03-30 DIAGNOSIS — J439 Emphysema, unspecified: Secondary | ICD-10-CM

## 2023-04-04 ENCOUNTER — Other Ambulatory Visit: Payer: Self-pay | Admitting: Internal Medicine

## 2023-04-04 DIAGNOSIS — M5441 Lumbago with sciatica, right side: Secondary | ICD-10-CM

## 2023-04-07 ENCOUNTER — Ambulatory Visit: Payer: Self-pay

## 2023-04-07 NOTE — Telephone Encounter (Signed)
Patient called, left VM to return the call to the office to speak to the NT.   Summary: drainage, sore throat, headache mentioned feels weak.   Pt stated her sinuses are acting up with drainage, sore throat, headache mentioned feels weak. She declined scheduling an appointment and asked that something be called in for her.  Seeking clinical.

## 2023-04-07 NOTE — Telephone Encounter (Signed)
Patient called, left VM to return the call to the office to speak to the NT.  2nd attempt Summary: drainage, sore throat, headache mentioned feels weak.   Pt stated her sinuses are acting up with drainage, sore throat, headache mentioned feels weak. She declined scheduling an appointment and asked that something be called in for her.  Seeking clinical.

## 2023-04-08 ENCOUNTER — Other Ambulatory Visit: Payer: Self-pay

## 2023-04-08 ENCOUNTER — Encounter: Payer: Self-pay | Admitting: Emergency Medicine

## 2023-04-08 ENCOUNTER — Emergency Department
Admission: EM | Admit: 2023-04-08 | Discharge: 2023-04-08 | Disposition: A | Discharge status: Home / Self Care | Payer: Medicare HMO | Attending: Emergency Medicine | Admitting: Emergency Medicine

## 2023-04-08 ENCOUNTER — Emergency Department: Payer: Medicare HMO

## 2023-04-08 DIAGNOSIS — J449 Chronic obstructive pulmonary disease, unspecified: Secondary | ICD-10-CM | POA: Diagnosis not present

## 2023-04-08 DIAGNOSIS — R059 Cough, unspecified: Secondary | ICD-10-CM | POA: Diagnosis not present

## 2023-04-08 DIAGNOSIS — R509 Fever, unspecified: Secondary | ICD-10-CM | POA: Diagnosis present

## 2023-04-08 DIAGNOSIS — U071 COVID-19: Secondary | ICD-10-CM | POA: Diagnosis not present

## 2023-04-08 DIAGNOSIS — I700 Atherosclerosis of aorta: Secondary | ICD-10-CM | POA: Diagnosis not present

## 2023-04-08 LAB — SARS CORONAVIRUS 2 BY RT PCR: SARS Coronavirus 2 by RT PCR: POSITIVE — AB

## 2023-04-08 MED ORDER — METHYLPREDNISOLONE 4 MG PO TBPK: ORAL_TABLET | ORAL | 0 refills | Status: DC

## 2023-04-08 MED ORDER — BENZONATATE 100 MG PO CAPS: 200.0000 mg | ORAL_CAPSULE | Freq: Three times a day (TID) | ORAL | 0 refills | Status: DC | PRN

## 2023-04-08 MED ORDER — NIRMATRELVIR/RITONAVIR (PAXLOVID)TABLET: 3.0000 | ORAL_TABLET | Freq: Two times a day (BID) | ORAL | 0 refills | Status: AC

## 2023-04-08 MED ORDER — ACETAMINOPHEN 325 MG PO TABS
650.0000 mg | ORAL_TABLET | Freq: Once | ORAL | Status: AC
  Administered 2023-04-08: 650 mg via ORAL
  Filled 2023-04-08: qty 2

## 2023-04-08 NOTE — ED Notes (Signed)
See triage note  Presents with body aches and fever  States her sx's started 4 days ago  Febrile on arrival   cough

## 2023-04-08 NOTE — Discharge Instructions (Signed)
Follow-up with your regular doctor if not improving in 3 to 4 days.  Return the emergency department if you are worsening.  If you begin to to have shortness of breath, feel that you are worsening, please return emergency department

## 2023-04-08 NOTE — Telephone Encounter (Signed)
Noted  KP 

## 2023-04-08 NOTE — Telephone Encounter (Signed)
Please schedule pt an appointment. No medication can be sent in unless seen. Pt can be put on Dans schedule if he has any appointments available. If pt doesn't want to come in she can treat herself at home.  KP

## 2023-04-08 NOTE — Telephone Encounter (Signed)
Patient went to the hospital for treatment.

## 2023-04-08 NOTE — ED Provider Notes (Signed)
[facility] Provider Note    Event Date/Time   First MD Initiated Contact with Patient 04/08/23 [phone]     (approximate)   History   Cough   HPI  Monica CREAMER is a 82 y.o. female with history of COPD, GERD, osteoarthritis, hypercholesterolemia presents emergency department with fever, chills and bodyaches for 5 days.  Patient took a home COVID test yesterday that was negative.  Continues to feel achy.  Denies shortness of breath      Physical Exam   Triage Vital Signs: ED Triage Vitals  Encounter Vitals Group     BP 04/08/23 0652 (!) 140/46     Systolic BP Percentile --      Diastolic BP Percentile --      Pulse Rate 04/08/23 0652 89     Resp 04/08/23 0652 18     Temp 04/08/23 0652 (!) 101 F (38.3 C)     Temp Source 04/08/23 0652 Oral     SpO2 04/08/23 0652 95 %     Weight 04/08/23 0654 170 lb (77.1 kg)     Height 04/08/23 0654 5\' 2"  (1.575 m)     Head Circumference --      Peak Flow --      Pain Score 04/08/23 0654 4     Pain Loc --      Pain Education --      Exclude from Growth Chart --     Most recent vital signs: Vitals:   04/08/23 0652 04/08/23 0730  BP: (!) 140/46 138/60  Pulse: 89 80  Resp: 18 18  Temp: (!) 101 F (38.3 C) 99.5 F (37.5 C)  SpO2: 95% 96%     General: Awake, no distress.   CV:  Good peripheral perfusion. regular rate and  rhythm Resp:  Normal effort. Lungs cta Abd:  No distention.   Other:      ED Results / Procedures / Treatments   Labs (all labs ordered are listed, but only abnormal results are displayed) Labs Reviewed  SARS CORONAVIRUS 2 BY RT PCR - Abnormal; Notable for the following components:      Result Value   SARS Coronavirus 2 by RT PCR POSITIVE (*)    All other components within normal limits     EKG     RADIOLOGY Chest x-ray    PROCEDURES:   Procedures   MEDICATIONS ORDERED IN ED: Medications  acetaminophen (TYLENOL) tablet 650 mg (650 mg Oral Given 04/08/23  0656)     IMPRESSION / MDM / ASSESSMENT AND PLAN / ED COURSE  I reviewed the triage vital signs and the nursing notes.                              Differential diagnosis includes, but is not limited to, COVID, CAP, bronchitis  Patient's presentation is most consistent with acute illness / injury with system symptoms.   COVID test positive  Chest x-ray independently reviewed interpreted by me as being negative for any acute abnormality  Did discuss findings with patient.  She appears to be stable at this time.  Temp is decreased with the Tylenol given in triage.  Due to her having COPD and her age we will place her on Paxlovid, Medrol Dosepak, Tessalon Perles for cough.  She is to follow-up with her regular doctor if not improving to 3 days.  Return if worsening.  She is in agreement  treatment plan.  Discharged stable condition.      FINAL CLINICAL IMPRESSION(S) / ED DIAGNOSES   Final diagnoses:  COVID     Rx / DC Orders   ED Discharge Orders          Ordered    nirmatrelvir/ritonavir (PAXLOVID) 20 x 150 MG & 10 x 100MG  TABS  2 times daily        04/08/23 0742    methylPREDNISolone (MEDROL DOSEPAK) 4 MG TBPK tablet        04/08/23 0742    benzonatate (TESSALON PERLES) 100 MG capsule  3 times daily PRN        04/08/23 1610             Note:  This document was prepared using Dragon voice recognition software and may include unintentional dictation errors.    Faythe Ghee, PA-C 04/08/23 [id number]    Merwyn Katos, MD 04/09/23 [phone]

## 2023-04-08 NOTE — ED Triage Notes (Signed)
Patient ambulatory to triage with steady gait, without difficulty or distress noted; pt reports last 4 days having runny nose, prod cough yellow sputum, and body aches; took covid test at home and was neg

## 2023-04-13 ENCOUNTER — Inpatient Hospital Stay: Payer: Medicare HMO | Admitting: Internal Medicine

## 2023-04-15 ENCOUNTER — Telehealth: Payer: Self-pay

## 2023-04-15 NOTE — Telephone Encounter (Signed)
Transition Care Management Unsuccessful Follow-up Telephone Call  Date of discharge and from where:  [facility] 8/1  Attempts:  2nd Attempt  Reason for unsuccessful TCM follow-up call:  No answer/busy   Lenard Forth [facility] Guide, [facility] [phone] [address] [place], [place], [place] [address] Phone: [phone] Email: [email or web address]

## 2023-04-15 NOTE — Telephone Encounter (Signed)
Transition Care Management Unsuccessful Follow-up Telephone Call  Date of discharge and from where:  [facility] 8/1  Attempts:  1st Attempt  Reason for unsuccessful TCM follow-up call:  No answer/busy   Lenard Forth [facility] Guide, [facility] [phone] [address] [place], [place], [place] [address] Phone: [phone] Email: [email or web address]

## 2023-04-19 ENCOUNTER — Encounter: Payer: Self-pay | Admitting: Internal Medicine

## 2023-04-19 ENCOUNTER — Other Ambulatory Visit: Payer: Self-pay

## 2023-04-19 ENCOUNTER — Ambulatory Visit
Admission: RE | Admit: 2023-04-19 | Discharge: 2023-04-19 | Disposition: A | Discharge status: Home / Self Care | Payer: Medicare HMO | Attending: Internal Medicine | Admitting: Internal Medicine

## 2023-04-19 ENCOUNTER — Ambulatory Visit
Admission: RE | Admit: 2023-04-19 | Discharge: 2023-04-19 | Disposition: A | Discharge status: Home / Self Care | Payer: Medicare HMO | Source: Ambulatory Visit | Attending: Internal Medicine | Admitting: Internal Medicine

## 2023-04-19 ENCOUNTER — Ambulatory Visit: Payer: Medicare HMO | Admitting: Internal Medicine

## 2023-04-19 VITALS — BP 112/70 | HR 103 | Temp 98.1°F | Ht 62.0 in | Wt 166.0 lb

## 2023-04-19 DIAGNOSIS — U099 Post covid-19 condition, unspecified: Secondary | ICD-10-CM

## 2023-04-19 DIAGNOSIS — R0602 Shortness of breath: Secondary | ICD-10-CM | POA: Diagnosis not present

## 2023-04-19 DIAGNOSIS — U071 COVID-19: Secondary | ICD-10-CM | POA: Diagnosis not present

## 2023-04-19 DIAGNOSIS — J208 Acute bronchitis due to other specified organisms: Secondary | ICD-10-CM | POA: Diagnosis not present

## 2023-04-19 DIAGNOSIS — J0100 Acute maxillary sinusitis, unspecified: Secondary | ICD-10-CM | POA: Diagnosis not present

## 2023-04-19 DIAGNOSIS — R9389 Abnormal findings on diagnostic imaging of other specified body structures: Secondary | ICD-10-CM | POA: Diagnosis not present

## 2023-04-19 MED ORDER — AZITHROMYCIN 250 MG PO TABS
ORAL_TABLET | ORAL | 0 refills | Status: AC
Start: 2023-04-19 — End: 2023-04-24

## 2023-04-19 MED ORDER — PROMETHAZINE-DM 6.25-15 MG/5ML PO SYRP
5.0000 mL | ORAL_SOLUTION | Freq: Four times a day (QID) | ORAL | 0 refills | Status: AC | PRN
Start: 2023-04-19 — End: 2023-04-28

## 2023-04-19 NOTE — Patient Instructions (Signed)
Continue to use the inhaler or nebulizer as needed.  Take Delsym during the day for cough and take the prescription cough syrup at bedtime

## 2023-04-19 NOTE — Progress Notes (Signed)
Date:  04/19/2023   Name:  Monica Campbell   DOB:  1940-12-18   MRN:  [id number]   Chief Complaint: Hospitalization Follow-up (Post Covid Cough, Congestion. Pt concerned about having pneumonia./)  Cough This is a new problem. The current episode started 1 to 4 weeks ago. The cough is Non-productive. Associated symptoms include wheezing. Pertinent negatives include no chest pain, chills, fever or headaches. Associated symptoms comments: Seen at ER - Covid +.  Treated with Paxlovid and steroid taper..  She took all of the medication and began to improve.  Fevers have resolved.  Now she has green sputum, PND and sinus pressure, ear pressure and loss of smell.  CXR today - no active pulmonary disease.   Lab Results  Component Value Date   NA 142 12/29/2022   K 4.4 12/29/2022   CO2 25 12/29/2022   GLUCOSE 89 12/29/2022   BUN 18 12/29/2022   CREATININE 0.89 12/29/2022   CALCIUM 9.5 12/29/2022   EGFR 65 12/29/2022   GFRNONAA 71 06/04/2020   Lab Results  Component Value Date   CHOL 254 (H) 12/29/2022   HDL 68 12/29/2022   LDLCALC 168 (H) 12/29/2022   TRIG 106 12/29/2022   CHOLHDL 3.7 12/29/2022   Lab Results  Component Value Date   TSH 3.020 06/08/2022   No results found for: "HGBA1C" Lab Results  Component Value Date   WBC 10.5 12/29/2022   HGB 13.4 12/29/2022   HCT 40.1 12/29/2022   MCV 90 12/29/2022   PLT 279 12/29/2022   Lab Results  Component Value Date   ALT 8 12/29/2022   AST 15 12/29/2022   ALKPHOS 73 12/29/2022   BILITOT 0.2 12/29/2022   Lab Results  Component Value Date   VD25OH 39.6 06/06/2021     Review of Systems  Constitutional:  Positive for fatigue. Negative for chills, diaphoresis and fever.  Respiratory:  Positive for cough, chest tightness and wheezing.   Cardiovascular:  Negative for chest pain and leg swelling.  Neurological:  Negative for dizziness, light-headedness and headaches.  Psychiatric/Behavioral:  Negative for dysphoric mood  and sleep disturbance. The patient is not nervous/anxious.     Patient Active Problem List   Diagnosis Date Noted   Lumbar stenosis with neurogenic claudication 06/04/2020   Ankle edema, bilateral 06/04/2020   Senile ecchymosis 06/04/2020   Arthritis of both acromioclavicular joints 09/05/2019   Bursitis of right shoulder 07/05/2019   Venous insufficiency of right leg 06/21/2019   Myalgia 06/21/2019   Vitamin D deficiency 06/21/2019   Carpal tunnel syndrome 04/10/2019   Acquired trigger finger 04/03/2019   Numbness of hand 04/03/2019   Foot pain, left 12/17/2017   Elevated TSH 05/03/2017   Age-related osteoporosis without current pathological fracture 03/24/2016   Chronic dermatitis 07/09/2015   Allergic rhinitis, seasonal 12/12/2014   Gastro-esophageal reflux disease without esophagitis 12/12/2014   Mixed hyperlipidemia 12/12/2014   Degenerative arthritis of hip 12/12/2014   Chronic obstructive pulmonary disease, unspecified (HCC) 10/11/2014   Bilateral hearing loss 07/15/2014    Allergies  Allergen Reactions   Augmentin [Amoxicillin-Pot Clavulanate] Itching    Itching all over.   Codeine Rash    Past Surgical History:  Procedure Laterality Date   CATARACT EXTRACTION W/PHACO Left 02/02/2018   Procedure: CATARACT EXTRACTION PHACO AND INTRAOCULAR LENS PLACEMENT (IOC) LEFT  MALYUGIN;  Surgeon: Lockie Mola, MD;  Location: [facility];  Service: Ophthalmology;  Laterality: Left;   CATARACT EXTRACTION W/PHACO Right 05/03/2018   Procedure:  CATARACT EXTRACTION PHACO AND INTRAOCULAR LENS PLACEMENT (IOC) RIGHT  IVA/TOPICAL;  Surgeon: Lockie Mola, MD;  Location: [facility];  Service: Ophthalmology;  Laterality: Right;   MINOR HEMORRHOIDECTOMY      Social History   Tobacco Use   Smoking status: Former    Current packs/day: 0.00    Average packs/day: 0.5 packs/day for 30.0 years (15.0 ttl pk-yrs)    Types: Cigarettes    Start date: 09/07/1970     Quit date: 09/07/2000    Years since quitting: 22.6   Smokeless tobacco: Never   Tobacco comments:    smoking cessation materials not required  Vaping Use   Vaping status: Never Used  Substance Use Topics   Alcohol use: Yes    Alcohol/week: 1.0 standard drink of alcohol    Types: 1 Glasses of wine per week    Comment: social   Drug use: No     Medication list has been reviewed and updated.  Current Meds  Medication Sig   acetaminophen (TYLENOL) 650 MG CR tablet Take 650 mg by mouth every 8 (eight) hours as needed for pain.   albuterol (PROVENTIL) (2.5 MG/3ML) 0.083% nebulizer solution Take 3 mLs by nebulization every 6 (six) hours as needed for wheezing or shortness of breath.   albuterol (VENTOLIN HFA) 108 (90 Base) MCG/ACT inhaler INHALE 2 PUFFS BY MOUTH EVERY 6 HOURS AS NEEDED FOR WHEEZING OR  SHORTNESS  OF  BREATH   aspirin 81 MG chewable tablet Chew 1 tablet by mouth daily.   azithromycin (ZITHROMAX Z-PAK) 250 MG tablet UAD   Biotin 1 MG CAPS Take by mouth.   blood glucose meter kit and supplies KIT Dispense based on patient and insurance preference. Use up to four times daily as directed.   Calcium Carbonate-Vit D-Min (CALTRATE 600+D PLUS MINERALS) 600-800 MG-UNIT CHEW Chew 1 tablet by mouth daily.   Cholecalciferol (VITAMIN D) 125 MCG (5000 UT) CAPS Take 1 capsule by mouth daily.   docusate sodium (COLACE) 100 MG capsule Take 1 capsule by mouth daily.   fluticasone (FLONASE) 50 MCG/ACT nasal spray Use 2 spray(s) in each nostril once daily   gabapentin (NEURONTIN) 100 MG capsule Take 1 capsule by mouth at bedtime   meloxicam (MOBIC) 15 MG tablet Take 15 mg by mouth daily.   promethazine-dextromethorphan (PROMETHAZINE-DM) 6.25-15 MG/5ML syrup Take 5 mLs by mouth 4 (four) times daily as needed for up to 9 days for cough.   TRELEGY ELLIPTA 100-62.5-25 MCG/ACT AEPB INHALE 1 PUFF INTO THE LUNGS ONCE DAILY   triamterene-hydrochlorothiazide (MAXZIDE-25) 37.5-25 MG tablet Take 1 tablet  by mouth daily.   zinc gluconate 50 MG tablet Take 50 mg by mouth daily.   [DISCONTINUED] benzonatate (TESSALON PERLES) 100 MG capsule Take 2 capsules (200 mg total) by mouth 3 (three) times daily as needed for cough.   [DISCONTINUED] methylPREDNISolone (MEDROL DOSEPAK) 4 MG TBPK tablet Take 6 pills on day one then decrease by 1 pill each day       04/19/2023    1:56 PM 12/29/2022    3:24 PM 06/08/2022   10:07 AM 12/04/2021   10:22 AM  GAD 7 : Generalized Anxiety Score  Nervous, Anxious, on Edge 1 1 1  0  Control/stop worrying 1 1 0 0  Worry too much - different things 1 1 1  0  Trouble relaxing 0 1 0 0  Restless 0 0 1 0  Easily annoyed or irritable 0 1 0 1  Afraid - awful might happen 0  1 1 1   Total GAD 7 Score 3 6 4 2   Anxiety Difficulty Not difficult at all Somewhat difficult Not difficult at all        04/19/2023    1:55 PM 12/29/2022    3:24 PM 06/08/2022   10:06 AM  Depression screen PHQ 2/9  Decreased Interest 3 1 1   Down, Depressed, Hopeless 3 0 0  PHQ - 2 Score 6 1 1   Altered sleeping 1 0 1  Tired, decreased energy 2 1 1   Change in appetite 2 0 0  Feeling bad or failure about yourself  0 0 0  Trouble concentrating 0 0 1  Moving slowly or fidgety/restless 0 0 1  Suicidal thoughts 0 0 0  PHQ-9 Score 11 2 5   Difficult doing work/chores Not difficult at all Not difficult at all Not difficult at all    BP Readings from Last 3 Encounters:  04/19/23 112/70  04/08/23 138/60  12/29/22 122/68    Physical Exam Vitals and nursing note reviewed.  Constitutional:      General: She is not in acute distress.    Appearance: Normal appearance. She is well-developed.  HENT:     Head: Normocephalic and atraumatic.     Nose:     Right Sinus: Maxillary sinus tenderness present. No frontal sinus tenderness.     Left Sinus: Maxillary sinus tenderness present. No frontal sinus tenderness.  Cardiovascular:     Rate and Rhythm: Normal rate and regular rhythm.  Pulmonary:      Effort: Pulmonary effort is normal. No respiratory distress.     Breath sounds: No wheezing or rhonchi.  Musculoskeletal:     Cervical back: Normal range of motion.     Right lower leg: No edema.     Left lower leg: No edema.  Lymphadenopathy:     Cervical: No cervical adenopathy.  Skin:    General: Skin is warm and dry.     Findings: No rash.  Neurological:     Mental Status: She is alert and oriented to person, place, and time.  Psychiatric:        Mood and Affect: Mood normal.        Behavior: Behavior normal.     Wt Readings from Last 3 Encounters:  04/19/23 166 lb (75.3 kg)  04/08/23 170 lb (77.1 kg)  12/29/22 168 lb (76.2 kg)    BP 112/70   Pulse (!) 103   Temp 98.1 F (36.7 C) (Oral)   Ht 5\' 2"  (1.575 m)   Wt 166 lb (75.3 kg)   SpO2 95%   BMI 30.36 kg/m   Assessment and Plan:  Problem List Items Addressed This Visit   None Visit Diagnoses     Acute non-recurrent maxillary sinusitis    -  Primary   Relevant Medications   azithromycin (ZITHROMAX Z-PAK) 250 MG tablet   promethazine-dextromethorphan (PROMETHAZINE-DM) 6.25-15 MG/5ML syrup   Acute bronchitis due to COVID-19 virus       Relevant Medications   azithromycin (ZITHROMAX Z-PAK) 250 MG tablet   promethazine-dextromethorphan (PROMETHAZINE-DM) 6.25-15 MG/5ML syrup       No follow-ups on file.    Reubin Milan, MD [facility] [place]

## 2023-05-04 ENCOUNTER — Ambulatory Visit (INDEPENDENT_AMBULATORY_CARE_PROVIDER_SITE_OTHER): Payer: Medicare HMO

## 2023-05-04 DIAGNOSIS — Z0000 Encounter for general adult medical examination without abnormal findings: Secondary | ICD-10-CM

## 2023-05-04 DIAGNOSIS — Z780 Asymptomatic menopausal state: Secondary | ICD-10-CM

## 2023-05-04 NOTE — Patient Instructions (Addendum)
Monica Campbell , Thank you for taking time to come for your Medicare Wellness Visit. I appreciate your ongoing commitment to your health goals. Please review the following plan we discussed and let me know if I can assist you in the future.   Referrals/Orders/Follow-Ups/Clinician Recommendations: none  This is a list of the screening recommended for you and due dates:  Health Maintenance  Topic Date Due   Zoster (Shingles) Vaccine (1 of 2) 05/30/1960   COVID-19 Vaccine (5 - 2023-24 season) 05/08/2022   Flu Shot  04/08/2023   Mammogram  05/06/2023   Medicare Annual Wellness Visit  05/03/2024   DTaP/Tdap/Td vaccine (2 - Td or Tdap) 05/11/2024   Pneumonia Vaccine  Completed   DEXA scan (bone density measurement)  Completed   HPV Vaccine  Aged Out   Hepatitis C Screening  Discontinued    Advanced directives: (ACP Link)Information on Advanced Care Planning can be found at [facility] Advance Health Care Directives Advance Health Care Directives ([email or web address]   Next Medicare Annual Wellness Visit scheduled for next year: Yes    05/09/24 @ 1:30 pm by phone

## 2023-05-04 NOTE — Progress Notes (Signed)
Subjective:   Monica Campbell is a 82 y.o. female who presents for Medicare Annual (Subsequent) preventive examination.  Visit Complete: Virtual  I connected with  Monica Campbell on 05/04/23 by a audio enabled telemedicine application and verified that I am speaking with the correct person using two identifiers.  Patient Location: Home  Provider Location: Office/Clinic  I discussed the limitations of evaluation and management by telemedicine. The patient expressed understanding and agreed to proceed.  Vital Signs: Unable to obtain new vitals due to this being a telehealth visit.  Review of Systems     Cardiac Risk Factors include: advanced age (>4men, >41 women);dyslipidemia;obesity (BMI >30kg/m2)     Objective:    Today's Vitals   05/04/23 1529  PainSc: 0-No pain   There is no height or weight on file to calculate BMI.     05/04/2023    3:35 PM 04/08/2023    6:57 AM 04/23/2021    3:34 PM 04/03/2020   11:45 AM 04/03/2019   10:43 AM 05/03/2018    7:45 AM 03/23/2018    3:56 PM  Advanced Directives  Does Patient Have a Medical Advance Directive? No No Yes No Yes No Yes  Type of Surveyor, minerals;Living will  Healthcare Power of [place];Living will  Healthcare Power of [place];Living will  Does patient want to make changes to medical advance directive?     No - Patient declined    Copy of Healthcare Power of Attorney in Chart?   No - copy requested  No - copy requested  No - copy requested  Would patient like information on creating a medical advance directive? No - Patient declined No - Patient declined  No - Patient declined  No - Patient declined     Current Medications (verified) Outpatient Encounter Medications as of 05/04/2023  Medication Sig   acetaminophen (TYLENOL) 650 MG CR tablet Take 650 mg by mouth every 8 (eight) hours as needed for pain.   albuterol (PROVENTIL) (2.5 MG/3ML) 0.083% nebulizer solution Take 3 mLs by nebulization  every 6 (six) hours as needed for wheezing or shortness of breath.   albuterol (VENTOLIN HFA) 108 (90 Base) MCG/ACT inhaler INHALE 2 PUFFS BY MOUTH EVERY 6 HOURS AS NEEDED FOR WHEEZING OR  SHORTNESS  OF  BREATH   aspirin 81 MG chewable tablet Chew 1 tablet by mouth daily.   blood glucose meter kit and supplies KIT Dispense based on patient and insurance preference. Use up to four times daily as directed.   Calcium Carbonate-Vit D-Min (CALTRATE 600+D PLUS MINERALS) 600-800 MG-UNIT CHEW Chew 1 tablet by mouth daily.   Cholecalciferol (VITAMIN D) 125 MCG (5000 UT) CAPS Take 1 capsule by mouth daily.   docusate sodium (COLACE) 100 MG capsule Take 1 capsule by mouth daily.   fluticasone (FLONASE) 50 MCG/ACT nasal spray Use 2 spray(s) in each nostril once daily   gabapentin (NEURONTIN) 100 MG capsule Take 1 capsule by mouth at bedtime   TRELEGY ELLIPTA 100-62.5-25 MCG/ACT AEPB INHALE 1 PUFF INTO THE LUNGS ONCE DAILY   triamterene-hydrochlorothiazide (MAXZIDE-25) 37.5-25 MG tablet Take 1 tablet by mouth daily.   zinc gluconate 50 MG tablet Take 50 mg by mouth daily.   Biotin 1 MG CAPS Take by mouth. (Patient not taking: Reported on 05/04/2023)   meloxicam (MOBIC) 15 MG tablet Take 15 mg by mouth daily. (Patient not taking: Reported on 05/04/2023)   No facility-administered encounter medications on file as of 05/04/2023.  Allergies (verified) Augmentin [amoxicillin-pot clavulanate] and Codeine   History: Past Medical History:  Diagnosis Date   COPD (chronic obstructive pulmonary disease) (HCC)    Dyspnea    GERD (gastroesophageal reflux disease)    Hard of hearing    Hypercholesteremia    Osteoarthritis    Seasonal allergies    Past Surgical History:  Procedure Laterality Date   CATARACT EXTRACTION W/PHACO Left 02/02/2018   Procedure: CATARACT EXTRACTION PHACO AND INTRAOCULAR LENS PLACEMENT (IOC) LEFT  MALYUGIN;  Surgeon: Lockie Mola, MD;  Location: [facility];  Service:  Ophthalmology;  Laterality: Left;   CATARACT EXTRACTION W/PHACO Right 05/03/2018   Procedure: CATARACT EXTRACTION PHACO AND INTRAOCULAR LENS PLACEMENT (IOC) RIGHT  IVA/TOPICAL;  Surgeon: Lockie Mola, MD;  Location: [facility];  Service: Ophthalmology;  Laterality: Right;   MINOR HEMORRHOIDECTOMY     Family History  Problem Relation Age of Onset   Heart disease Mother    Heart disease Sister    Heart disease Brother    Cancer Father        colon   Breast cancer Neg Hx    Social History   Socioeconomic History   Marital status: Divorced    Spouse name: Not on file   Number of children: 3   Years of education: Not on file   Highest education level: 12th grade  Occupational History   Occupation: Retired  Tobacco Use   Smoking status: Former    Current packs/day: 0.00    Average packs/day: 0.5 packs/day for 30.0 years (15.0 ttl pk-yrs)    Types: Cigarettes    Start date: 09/07/1970    Quit date: 09/07/2000    Years since quitting: 22.6   Smokeless tobacco: Never   Tobacco comments:    smoking cessation materials not required  Vaping Use   Vaping status: Never Used  Substance and Sexual Activity   Alcohol use: Yes    Alcohol/week: 1.0 standard drink of alcohol    Types: 1 Glasses of wine per week    Comment: social   Drug use: No   Sexual activity: Not Currently    Birth control/protection: None  Other Topics Concern   Not on file  Social History Narrative   Pt lives alone    Social Determinants of Health   Financial Resource Strain: Low Risk  (05/04/2023)   Overall Financial Resource Strain (CARDIA)    Difficulty of Paying Living Expenses: Not hard at all  Food Insecurity: No Food Insecurity (05/04/2023)   Hunger Vital Sign    Worried About Running Out of Food in the Last Year: Never true    Ran Out of Food in the Last Year: Never true  Transportation Needs: No Transportation Needs (05/04/2023)   PRAPARE - Administrator, Civil Service  (Medical): No    Lack of Transportation (Non-Medical): No  Physical Activity: Insufficiently Active (05/04/2023)   Exercise Vital Sign    Days of Exercise per Week: 7 days    Minutes of Exercise per Session: 20 min  Stress: No Stress Concern Present (05/04/2023)   [employer] of Occupational Health - Occupational Stress Questionnaire    Feeling of Stress : Not at all  Social Connections: Socially Isolated (05/04/2023)   Social Connection and Isolation Panel [NHANES]    Frequency of Communication with Friends and Family: More than three times a week    Frequency of Social Gatherings with Friends and Family: Three times a week    Attends Religious  Services: Never    Database administrator or Organizations: No    Attends Engineer, structural: Never    Marital Status: Divorced    Tobacco Counseling Counseling given: Not Answered Tobacco comments: smoking cessation materials not required   Clinical Intake:  Pre-visit preparation completed: Yes  Pain : No/denies pain Pain Score: 0-No pain     Nutritional Risks: None Diabetes: No  How often do you need to have someone help you when you read instructions, pamphlets, or other written materials from your doctor or pharmacy?: 1 - Never  Interpreter Needed?: No  Information entered by :: Kennedy Bucker, LPN   Activities of Daily Living    05/04/2023    3:37 PM  In your present state of health, do you have any difficulty performing the following activities:  Hearing? 1  Vision? 0  Difficulty concentrating or making decisions? 0  Walking or climbing stairs? 0  Dressing or bathing? 0  Doing errands, shopping? 0  Preparing Food and eating ? N  Using the Toilet? N  In the past six months, have you accidently leaked urine? N  Do you have problems with loss of bowel control? N  Managing your Medications? N  Managing your Finances? N  Housekeeping or managing your Housekeeping? N    Patient Care Team: Reubin Milan, MD as PCP - General (Internal Medicine) Lockie Mola, MD as Referring Physician (Ophthalmology) Elijah Birk, MD as Referring Physician (Physical Medicine and Rehabilitation) Patterson Hammersmith, MD as Consulting Physician (Rheumatology) Jesusita Oka, MD (Dermatology) Armando Reichert, MD as Referring Physician (Cardiology)  Indicate any recent Medical Services you may have received from other than [facility] providers in the past year (date may be approximate).     Assessment:   This is a routine wellness examination for Chama.  Hearing/Vision screen Hearing Screening - Comments:: Wears aids Vision Screening - Comments:: Wears glasses- Dr.Brasington  Dietary issues and exercise activities discussed:     Goals Addressed             This Visit's Progress    DIET - EAT MORE FRUITS AND VEGETABLES         Depression Screen    05/04/2023    3:33 PM 04/19/2023    1:55 PM 12/29/2022    3:24 PM 06/08/2022   10:06 AM 05/01/2022    2:58 PM 12/04/2021   10:22 AM 06/06/2021   10:46 AM  PHQ 2/9 Scores  PHQ - 2 Score 2 6 1 1  0 0 0  PHQ- 9 Score 3 11 2 5  1 2     Fall Risk    05/04/2023    3:37 PM 04/19/2023    1:55 PM 12/29/2022    3:24 PM 06/08/2022   10:07 AM 05/01/2022    3:02 PM  Fall Risk   Falls in the past year? 0 0 0 0 0  Number falls in past yr: 0 0 0 0 0  Injury with Fall? 0 0 0 0 0  Risk for fall due to : No Fall Risks;Impaired mobility Impaired mobility No Fall Risks No Fall Risks No Fall Risks  Follow up Falls prevention discussed;Falls evaluation completed Falls evaluation completed Falls evaluation completed Falls evaluation completed Falls evaluation completed    MEDICARE RISK AT HOME: Medicare Risk at Home Any stairs in or around the home?: Yes If so, are there any without handrails?: No Home free of loose throw rugs in walkways, pet beds, electrical  cords, etc?: Yes Adequate lighting in your home to reduce risk of falls?: Yes Life  alert?: No Use of a cane, walker or w/c?: No Grab bars in the bathroom?: Yes Shower chair or bench in shower?: No Elevated toilet seat or a handicapped toilet?: No  TIMED UP AND GO:  Was the test performed?  No    Cognitive Function:        05/04/2023    3:39 PM 05/01/2022    3:04 PM 04/03/2020   11:50 AM 04/03/2019   10:47 AM 03/23/2018    3:40 PM  6CIT Screen  What Year? 0 points 0 points 0 points 0 points 0 points  What month? 0 points 0 points 0 points 0 points 0 points  What time? 0 points 0 points 0 points 0 points 0 points  Count back from 20 0 points 0 points 0 points 0 points 0 points  Months in reverse 0 points 0 points 0 points 0 points 2 points  Repeat phrase 0 points 0 points 0 points 0 points 2 points  Total Score 0 points 0 points 0 points 0 points 4 points    Immunizations Immunization History  Administered Date(s) Administered   Fluad Quad(high Dose 65+) 06/04/2020, 06/06/2021, 06/08/2022   Influenza, High Dose Seasonal PF 06/09/2017, 05/12/2018, 05/10/2019   Influenza-Unspecified 05/18/2012, 07/24/2015   PFIZER Comirnaty(Gray Top)Covid-19 Tri-Sucrose Vaccine 03/28/2021   PFIZER(Purple Top)SARS-COV-2 Vaccination 11/06/2019, 11/26/2019, 07/03/2020   PNEUMOCOCCAL CONJUGATE-20 06/08/2022   Pneumococcal Conjugate-13 01/17/2014   Pneumococcal Polysaccharide-23 02/06/2004, 02/20/2007, 12/16/2011   Tdap 05/11/2014   Zoster, Live 02/04/2010    TDAP status: Up to date  Flu Vaccine status: Up to date  Pneumococcal vaccine status: Up to date  Covid-19 vaccine status: Completed vaccines  Qualifies for Shingles Vaccine? Yes   Zostavax completed Yes   Shingrix Completed?: No.    Education has been provided regarding the importance of this vaccine. Patient has been advised to call insurance company to determine out of pocket expense if they have not yet received this vaccine. Advised may also receive vaccine at local pharmacy or Health Dept. Verbalized acceptance  and understanding.  Screening Tests Health Maintenance  Topic Date Due   Zoster Vaccines- Shingrix (1 of 2) 05/30/1960   COVID-19 Vaccine (5 - 2023-24 season) 05/08/2022   INFLUENZA VACCINE  04/08/2023   MAMMOGRAM  05/06/2023   Medicare Annual Wellness (AWV)  05/03/2024   DTaP/Tdap/Td (2 - Td or Tdap) 05/11/2024   Pneumonia Vaccine 55+ Years old  Completed   DEXA SCAN  Completed   HPV VACCINES  Aged Out   Hepatitis C Screening  Discontinued    Health Maintenance  Health Maintenance Due  Topic Date Due   Zoster Vaccines- Shingrix (1 of 2) 05/30/1960   COVID-19 Vaccine (5 - 2023-24 season) 05/08/2022   INFLUENZA VACCINE  04/08/2023    Colorectal cancer screening: No longer required.   Mammogram status: Ordered 12/29/22. Pt provided with contact info and advised to call to schedule appt.   Bone Density status: Completed 06/11/21. Results reflect: Bone density results: OSTEOPOROSIS. Repeat every 2 years.- referral sent  Lung Cancer Screening: (Low Dose CT Chest recommended if Age 34-80 years, 20 pack-year currently smoking OR have quit w/in 15years.) does not qualify.    Additional Screening:  Hepatitis C Screening: does not qualify; Completed no  Vision Screening: Recommended annual ophthalmology exams for early detection of glaucoma and other disorders of the eye. Is the patient up to date with their annual  eye exam?  Yes  Who is the provider or what is the name of the office in which the patient attends annual eye exams? Dr.Brasington If pt is not established with a provider, would they like to be referred to a provider to establish care? No .   Dental Screening: Recommended annual dental exams for proper oral hygiene   Community Resource Referral / Chronic Care Management: CRR required this visit?  No   CCM required this visit?  No     Plan:     I have personally reviewed and noted the following in the patient's chart:   Medical and social history Use of  alcohol, tobacco or illicit drugs  Current medications and supplements including opioid prescriptions. Patient is not currently taking opioid prescriptions. Functional ability and status Nutritional status Physical activity Advanced directives List of other physicians Hospitalizations, surgeries, and ER visits in previous 12 months Vitals Screenings to include cognitive, depression, and falls Referrals and appointments  In addition, I have reviewed and discussed with patient certain preventive protocols, quality metrics, and best practice recommendations. A written personalized care plan for preventive services as well as general preventive health recommendations were provided to patient.     Hal Hope, LPN   [id number]   After Visit Summary: (MyChart) Due to this being a telephonic visit, the after visit summary with patients personalized plan was offered to patient via MyChart   Nurse Notes: none

## 2023-05-07 DIAGNOSIS — J439 Emphysema, unspecified: Secondary | ICD-10-CM | POA: Diagnosis not present

## 2023-05-07 DIAGNOSIS — R079 Chest pain, unspecified: Secondary | ICD-10-CM | POA: Diagnosis not present

## 2023-05-07 DIAGNOSIS — E782 Mixed hyperlipidemia: Secondary | ICD-10-CM | POA: Diagnosis not present

## 2023-05-07 DIAGNOSIS — R0602 Shortness of breath: Secondary | ICD-10-CM | POA: Diagnosis not present

## 2023-05-07 DIAGNOSIS — J438 Other emphysema: Secondary | ICD-10-CM | POA: Diagnosis not present

## 2023-05-13 DIAGNOSIS — R0602 Shortness of breath: Secondary | ICD-10-CM | POA: Diagnosis not present

## 2023-05-13 DIAGNOSIS — R0789 Other chest pain: Secondary | ICD-10-CM | POA: Diagnosis not present

## 2023-05-13 DIAGNOSIS — R69 Illness, unspecified: Secondary | ICD-10-CM | POA: Diagnosis not present

## 2023-05-19 DIAGNOSIS — R0602 Shortness of breath: Secondary | ICD-10-CM | POA: Diagnosis not present

## 2023-05-24 ENCOUNTER — Telehealth: Payer: Self-pay | Admitting: Internal Medicine

## 2023-05-24 NOTE — Telephone Encounter (Signed)
Pt is calling to reschedule CPE appt on 05/12/23. Provider will be out of the office. Pt would like to know can she be worked into appt for CPE this year. CPE's not available until February 2025. Please advise CB- [phone]

## 2023-06-11 ENCOUNTER — Encounter: Payer: Medicare HMO | Admitting: Internal Medicine

## 2023-06-29 DIAGNOSIS — H43813 Vitreous degeneration, bilateral: Secondary | ICD-10-CM | POA: Diagnosis not present

## 2023-06-29 DIAGNOSIS — Z0100 Encounter for examination of eyes and vision without abnormal findings: Secondary | ICD-10-CM | POA: Diagnosis not present

## 2023-07-08 ENCOUNTER — Encounter: Payer: Self-pay | Admitting: Internal Medicine

## 2023-07-08 ENCOUNTER — Ambulatory Visit: Payer: Medicare HMO | Admitting: Internal Medicine

## 2023-07-08 VITALS — BP 132/74 | HR 74 | Ht 62.0 in | Wt 167.0 lb

## 2023-07-08 DIAGNOSIS — Z0000 Encounter for general adult medical examination without abnormal findings: Secondary | ICD-10-CM

## 2023-07-08 DIAGNOSIS — R7989 Other specified abnormal findings of blood chemistry: Secondary | ICD-10-CM

## 2023-07-08 DIAGNOSIS — E782 Mixed hyperlipidemia: Secondary | ICD-10-CM | POA: Diagnosis not present

## 2023-07-08 DIAGNOSIS — J439 Emphysema, unspecified: Secondary | ICD-10-CM | POA: Diagnosis not present

## 2023-07-08 DIAGNOSIS — Z23 Encounter for immunization: Secondary | ICD-10-CM

## 2023-07-08 DIAGNOSIS — K219 Gastro-esophageal reflux disease without esophagitis: Secondary | ICD-10-CM

## 2023-07-08 DIAGNOSIS — K644 Residual hemorrhoidal skin tags: Secondary | ICD-10-CM

## 2023-07-08 DIAGNOSIS — M25471 Effusion, right ankle: Secondary | ICD-10-CM

## 2023-07-08 DIAGNOSIS — Z1231 Encounter for screening mammogram for malignant neoplasm of breast: Secondary | ICD-10-CM | POA: Diagnosis not present

## 2023-07-08 DIAGNOSIS — M25472 Effusion, left ankle: Secondary | ICD-10-CM | POA: Diagnosis not present

## 2023-07-08 DIAGNOSIS — M810 Age-related osteoporosis without current pathological fracture: Secondary | ICD-10-CM

## 2023-07-08 NOTE — Assessment & Plan Note (Signed)
Managed with diet and weight control alone. Lab Results  Component Value Date   LDLCALC 168 (H) 12/29/2022

## 2023-07-08 NOTE — Assessment & Plan Note (Signed)
Repeat DEXA due; no recent fracture Continue calcium and vitamin D

## 2023-07-08 NOTE — Assessment & Plan Note (Signed)
Continue to monitor

## 2023-07-08 NOTE — Assessment & Plan Note (Signed)
Benign and asymptomatic currently No treatment needed at this time.

## 2023-07-08 NOTE — Assessment & Plan Note (Signed)
COPD stable on Trelegy and albuterol PRN Immunizations up to date Consider RSV vaccine

## 2023-07-08 NOTE — Assessment & Plan Note (Signed)
Controlled with reduced sodium diet and PRN maxzide

## 2023-07-08 NOTE — Patient Instructions (Signed)
Call [facility] to schedule your Bone Density with your mammogram at [phone].  Your Mammogram is scheduled for 07/15/23.

## 2023-07-08 NOTE — Progress Notes (Signed)
Date:  07/08/2023   Name:  Monica Campbell   DOB:  [birth year]   MRN:  [id number]   Chief Complaint: Annual Exam Monica Campbell is a 82 y.o. female who presents today for her Complete Annual Exam. She feels well. She reports exercising/walking. She reports she is sleeping well. Breast complaints - none.  Mammogram: scheduled for 07/15/23 DEXA: 06/2021 osteopenia hip Colonoscopy: 2013 aged out  Health Maintenance Due  Topic Date Due   Zoster Vaccines- Shingrix (1 of 2) 05/30/1960   MAMMOGRAM  05/06/2023    Immunization History  Administered Date(s) Administered   Fluad Quad(high Dose 65+) 06/04/2020, 06/06/2021, 06/08/2022   Fluad Trivalent(High Dose 65+) 07/08/2023   Influenza, High Dose Seasonal PF 06/09/2017, 05/12/2018, 05/10/2019   Influenza-Unspecified 05/18/2012, 07/24/2015   PFIZER Comirnaty(Gray Top)Covid-19 Tri-Sucrose Vaccine 03/28/2021   PFIZER(Purple Top)SARS-COV-2 Vaccination 11/06/2019, 11/26/2019, 07/03/2020   PNEUMOCOCCAL CONJUGATE-20 06/08/2022   Pfizer(Comirnaty)Fall Seasonal Vaccine 12 years and older 07/08/2023   Pneumococcal Conjugate-13 01/17/2014   Pneumococcal Polysaccharide-23 02/06/2004, 02/20/2007, 12/16/2011   Tdap 05/11/2014   Zoster, Live 02/04/2010     Shortness of Breath This is a chronic problem. The problem has been unchanged. Pertinent negatives include no abdominal pain, chest pain, fever, headaches, leg swelling, rash, vomiting or wheezing. She has tried beta agonist inhalers, steroid inhalers and ipratropium inhalers for the symptoms. The treatment provided significant relief. Her past medical history is significant for COPD.  Gastroesophageal Reflux She complains of heartburn. She reports no abdominal pain, no chest pain, no coughing or no wheezing. This is a recurrent problem. The problem occurs rarely. The heartburn duration is less than a minute. The heartburn is located in the substernum. The heartburn is of mild intensity.  Pertinent negatives include no fatigue. She has tried an antacid for the symptoms. The treatment provided significant relief.  Osteoporosis - on calcium and vitamin D daily.  Due for repeat DEXA.  Review of Systems  Constitutional:  Negative for chills, fatigue and fever.  HENT:  Negative for congestion, hearing loss, tinnitus, trouble swallowing and voice change.   Eyes:  Negative for visual disturbance.  Respiratory:  Positive for shortness of breath. Negative for cough, chest tightness and wheezing.   Cardiovascular:  Negative for chest pain, palpitations and leg swelling.  Gastrointestinal:  Positive for anal bleeding (hemorrhoid vs tag) and heartburn. Negative for abdominal pain, constipation, diarrhea and vomiting.  Endocrine: Negative for polydipsia and polyuria.  Genitourinary:  Negative for dysuria, frequency, genital sores, vaginal bleeding and vaginal discharge.  Musculoskeletal:  Negative for arthralgias, gait problem and joint swelling.  Skin:  Negative for color change and rash.  Neurological:  Negative for dizziness, tremors, light-headedness and headaches.  Hematological:  Negative for adenopathy. Does not bruise/bleed easily.  Psychiatric/Behavioral:  Negative for dysphoric mood and sleep disturbance. The patient is not nervous/anxious.      Lab Results  Component Value Date   NA 142 12/29/2022   K 4.4 12/29/2022   CO2 25 12/29/2022   GLUCOSE 89 12/29/2022   BUN 18 12/29/2022   CREATININE 0.89 12/29/2022   CALCIUM 9.5 12/29/2022   EGFR 65 12/29/2022   GFRNONAA 71 06/04/2020   Lab Results  Component Value Date   CHOL 254 (H) 12/29/2022   HDL 68 12/29/2022   LDLCALC 168 (H) 12/29/2022   TRIG 106 12/29/2022   CHOLHDL 3.7 12/29/2022   Lab Results  Component Value Date   TSH 3.020 06/08/2022   No results found for: "HGBA1C" Lab  Results  Component Value Date   WBC 10.5 12/29/2022   HGB 13.4 12/29/2022   HCT 40.1 12/29/2022   MCV 90 12/29/2022   PLT 279  12/29/2022   Lab Results  Component Value Date   ALT 8 12/29/2022   AST 15 12/29/2022   ALKPHOS 73 12/29/2022   BILITOT 0.2 12/29/2022   Lab Results  Component Value Date   VD25OH 39.6 06/06/2021     Patient Active Problem List   Diagnosis Date Noted   Skin tag of anus 07/08/2023   Lumbar stenosis with neurogenic claudication 06/04/2020   Ankle edema, bilateral 06/04/2020   Senile ecchymosis 06/04/2020   Arthritis of both acromioclavicular joints 09/05/2019   Bursitis of right shoulder 07/05/2019   Venous insufficiency of right leg 06/21/2019   Myalgia 06/21/2019   Vitamin D deficiency 06/21/2019   Carpal tunnel syndrome 04/10/2019   Acquired trigger finger 04/03/2019   Elevated TSH 05/03/2017   Age-related osteoporosis without current pathological fracture 03/24/2016   Chronic dermatitis 07/09/2015   Allergic rhinitis, seasonal 12/12/2014   Gastro-esophageal reflux disease without esophagitis 12/12/2014   Mixed hyperlipidemia 12/12/2014   Degenerative arthritis of hip 12/12/2014   Chronic obstructive pulmonary disease, unspecified (HCC) 10/11/2014   Bilateral hearing loss 07/15/2014    Allergies  Allergen Reactions   Augmentin [Amoxicillin-Pot Clavulanate] Itching    Itching all over.   Codeine Rash    Past Surgical History:  Procedure Laterality Date   CATARACT EXTRACTION W/PHACO Left 02/02/2018   Procedure: CATARACT EXTRACTION PHACO AND INTRAOCULAR LENS PLACEMENT (IOC) LEFT  MALYUGIN;  Surgeon: Lockie Mola, MD;  Location: [facility];  Service: Ophthalmology;  Laterality: Left;   CATARACT EXTRACTION W/PHACO Right 05/03/2018   Procedure: CATARACT EXTRACTION PHACO AND INTRAOCULAR LENS PLACEMENT (IOC) RIGHT  IVA/TOPICAL;  Surgeon: Lockie Mola, MD;  Location: [facility];  Service: Ophthalmology;  Laterality: Right;   MINOR HEMORRHOIDECTOMY      Social History   Tobacco Use   Smoking status: Former    Current packs/day: 0.00     Average packs/day: 0.5 packs/day for 30.0 years (15.0 ttl pk-yrs)    Types: Cigarettes    Start date: 09/07/1970    Quit date: 09/07/2000    Years since quitting: 22.8   Smokeless tobacco: Never   Tobacco comments:    smoking cessation materials not required  Vaping Use   Vaping status: Never Used  Substance Use Topics   Alcohol use: Yes    Alcohol/week: 1.0 standard drink of alcohol    Types: 1 Glasses of wine per week    Comment: social   Drug use: No     Medication list has been reviewed and updated.  Current Meds  Medication Sig   acetaminophen (TYLENOL) 650 MG CR tablet Take 650 mg by mouth every 8 (eight) hours as needed for pain.   albuterol (PROVENTIL) (2.5 MG/3ML) 0.083% nebulizer solution Take 3 mLs by nebulization every 6 (six) hours as needed for wheezing or shortness of breath.   albuterol (VENTOLIN HFA) 108 (90 Base) MCG/ACT inhaler INHALE 2 PUFFS BY MOUTH EVERY 6 HOURS AS NEEDED FOR WHEEZING OR  SHORTNESS  OF  BREATH   aspirin 81 MG chewable tablet Chew 1 tablet by mouth daily.   blood glucose meter kit and supplies KIT Dispense based on patient and insurance preference. Use up to four times daily as directed.   Calcium Carbonate-Vit D-Min (CALTRATE 600+D PLUS MINERALS) 600-800 MG-UNIT CHEW Chew 1 tablet by mouth daily.  Cholecalciferol (VITAMIN D) 125 MCG (5000 UT) CAPS Take 1 capsule by mouth daily.   docusate sodium (COLACE) 100 MG capsule Take 1 capsule by mouth daily.   fluticasone (FLONASE) 50 MCG/ACT nasal spray Use 2 spray(s) in each nostril once daily   gabapentin (NEURONTIN) 100 MG capsule Take 1 capsule by mouth at bedtime   TRELEGY ELLIPTA 100-62.5-25 MCG/ACT AEPB INHALE 1 PUFF INTO THE LUNGS ONCE DAILY   triamterene-hydrochlorothiazide (MAXZIDE-25) 37.5-25 MG tablet Take 1 tablet by mouth daily.   zinc gluconate 50 MG tablet Take 50 mg by mouth daily.       07/08/2023   10:59 AM 04/19/2023    1:56 PM 12/29/2022    3:24 PM 06/08/2022   10:07 AM  GAD 7  : Generalized Anxiety Score  Nervous, Anxious, on Edge 1 1 1 1   Control/stop worrying 1 1 1  0  Worry too much - different things 1 1 1 1   Trouble relaxing 0 0 1 0  Restless 1 0 0 1  Easily annoyed or irritable 0 0 1 0  Afraid - awful might happen 1 0 1 1  Total GAD 7 Score 5 3 6 4   Anxiety Difficulty Not difficult at all Not difficult at all Somewhat difficult Not difficult at all       07/08/2023   10:59 AM 05/04/2023    3:33 PM 04/19/2023    1:55 PM  Depression screen PHQ 2/9  Decreased Interest 0 1 3  Down, Depressed, Hopeless 0 1 3  PHQ - 2 Score 0 2 6  Altered sleeping 0 0 1  Tired, decreased energy 1 1 2   Change in appetite 0 0 2  Feeling bad or failure about yourself  0 0 0  Trouble concentrating 1 0 0  Moving slowly or fidgety/restless 1 0 0  Suicidal thoughts 0 0 0  PHQ-9 Score 3 3 11   Difficult doing work/chores Somewhat difficult Not difficult at all Not difficult at all    BP Readings from Last 3 Encounters:  07/08/23 132/74  04/19/23 112/70  04/08/23 138/60    Physical Exam Vitals and nursing note reviewed.  Constitutional:      General: She is not in acute distress.    Appearance: Normal appearance. She is well-developed.  HENT:     Head: Normocephalic and atraumatic.     Right Ear: Tympanic membrane and ear canal normal. Decreased hearing noted.     Left Ear: Tympanic membrane and ear canal normal. Decreased hearing noted.     Nose:     Right Sinus: No maxillary sinus tenderness.     Left Sinus: No maxillary sinus tenderness.  Eyes:     General: No scleral icterus.       Right eye: No discharge.        Left eye: No discharge.     Conjunctiva/sclera: Conjunctivae normal.  Neck:     Thyroid: No thyromegaly.     Vascular: No carotid bruit.  Cardiovascular:     Rate and Rhythm: Normal rate and regular rhythm.     Pulses: Normal pulses.     Heart sounds: Normal heart sounds.  Pulmonary:     Effort: Pulmonary effort is normal. No respiratory  distress.     Breath sounds: No wheezing.  Abdominal:     General: Bowel sounds are normal.     Palpations: Abdomen is soft.     Tenderness: There is no abdominal tenderness.  Genitourinary:  Comments: Small flesh colored lentil sized tag - no tenderness, bleeding or erythema Musculoskeletal:     Cervical back: Normal range of motion. No erythema.     Right lower leg: No edema.     Left lower leg: No edema.     Right ankle: Swelling present.     Left ankle: Swelling present.     Comments: Minimal swelling both ankles  Lymphadenopathy:     Cervical: No cervical adenopathy.  Skin:    General: Skin is warm and dry.     Findings: No rash.  Neurological:     Mental Status: She is alert and oriented to person, place, and time.     Cranial Nerves: No cranial nerve deficit.     Sensory: No sensory deficit.     Deep Tendon Reflexes: Reflexes are normal and symmetric.  Psychiatric:        Attention and Perception: Attention normal.        Mood and Affect: Mood normal.     Wt Readings from Last 3 Encounters:  07/08/23 167 lb (75.8 kg)  04/19/23 166 lb (75.3 kg)  04/08/23 170 lb (77.1 kg)    BP 132/74   Pulse 74   Ht 5\' 2"  (1.575 m)   Wt 167 lb (75.8 kg)   SpO2 95%   BMI 30.54 kg/m   Assessment and Plan:  Problem List Items Addressed This Visit       Unprioritized   Gastro-esophageal reflux disease without esophagitis (Chronic)    Reflux symptoms are minimal on current therapy - TUMS prn. No red flag signs such as weight loss, n/v, melena       Relevant Orders   CBC with Differential/Platelet   Mixed hyperlipidemia (Chronic)    Managed with diet and weight control alone. Lab Results  Component Value Date   LDLCALC 168 (H) 12/29/2022         Relevant Orders   Lipid panel   Chronic obstructive pulmonary disease, unspecified (HCC) (Chronic)    COPD stable on Trelegy and albuterol PRN Immunizations up to date Consider RSV vaccine      Age-related  osteoporosis without current pathological fracture (Chronic)    Repeat DEXA due; no recent fracture Continue calcium and vitamin D      Elevated TSH    Continue to monitor.      Relevant Orders   TSH   Ankle edema, bilateral (Chronic)    Controlled with reduced sodium diet and PRN maxzide      Relevant Orders   CBC with Differential/Platelet   Comprehensive metabolic panel   Skin tag of anus    Benign and asymptomatic currently No treatment needed at this time.      Other Visit Diagnoses     Annual physical exam    -  Primary   up to date on screenings/immunizations   Encounter for screening mammogram for breast cancer       scheduled for next week   Need for COVID-19 vaccine       Relevant Orders   Pfizer Comirnaty Covid -19 Vaccine 53yrs and older (Completed)   Need for influenza vaccination       Relevant Orders   Flu Vaccine Trivalent High Dose (Fluad) (Completed)       No follow-ups on file.    Reubin Milan, MD [facility] [place]

## 2023-07-08 NOTE — Assessment & Plan Note (Signed)
Reflux symptoms are minimal on current therapy - TUMS prn. No red flag signs such as weight loss, n/v, melena

## 2023-07-09 LAB — CBC WITH DIFFERENTIAL/PLATELET
Basophils Absolute: 0 10*3/uL (ref 0.0–0.2)
Basos: 1 %
EOS (ABSOLUTE): 0.2 10*3/uL (ref 0.0–0.4)
Eos: 2 %
Hematocrit: 39.2 % (ref 34.0–46.6)
Hemoglobin: 12.9 g/dL (ref 11.1–15.9)
Immature Grans (Abs): 0 10*3/uL (ref 0.0–0.1)
Immature Granulocytes: 0 %
Lymphocytes Absolute: 2.7 10*3/uL (ref 0.7–3.1)
Lymphs: 32 %
MCH: 29.6 pg (ref 26.6–33.0)
MCHC: 32.9 g/dL (ref 31.5–35.7)
MCV: 90 fL (ref 79–97)
Monocytes Absolute: 0.9 10*3/uL (ref 0.1–0.9)
Monocytes: 11 %
Neutrophils Absolute: 4.4 10*3/uL (ref 1.4–7.0)
Neutrophils: 54 %
Platelets: 279 10*3/uL (ref 150–450)
RBC: 4.36 x10E6/uL (ref 3.77–5.28)
RDW: 12.7 % (ref 11.7–15.4)
WBC: 8.2 10*3/uL (ref 3.4–10.8)

## 2023-07-09 LAB — LIPID PANEL
Chol/HDL Ratio: 4.1 ratio (ref 0.0–4.4)
Cholesterol, Total: 239 mg/dL — ABNORMAL HIGH (ref 100–199)
HDL: 58 mg/dL (ref >39)
LDL Chol Calc (NIH): 149 mg/dL — ABNORMAL HIGH (ref 0–99)
Triglycerides: 177 mg/dL — ABNORMAL HIGH (ref 0–149)
VLDL Cholesterol Cal: 32 mg/dL (ref 5–40)

## 2023-07-09 LAB — COMPREHENSIVE METABOLIC PANEL
ALT: 7 [IU]/L (ref 0–32)
AST: 16 [IU]/L (ref 0–40)
Albumin: 4 g/dL (ref 3.7–4.7)
Alkaline Phosphatase: 69 [IU]/L (ref 44–121)
BUN/Creatinine Ratio: 16 (ref 12–28)
BUN: 13 mg/dL (ref 8–27)
Bilirubin Total: 0.3 mg/dL (ref 0.0–1.2)
CO2: 26 mmol/L (ref 20–29)
Calcium: 9.4 mg/dL (ref 8.7–10.3)
Chloride: 100 mmol/L (ref 96–106)
Creatinine, Ser: 0.8 mg/dL (ref 0.57–1.00)
Globulin, Total: 2.5 g/dL (ref 1.5–4.5)
Glucose: 91 mg/dL (ref 70–99)
Potassium: 4.1 mmol/L (ref 3.5–5.2)
Sodium: 139 mmol/L (ref 134–144)
Total Protein: 6.5 g/dL (ref 6.0–8.5)
eGFR: 74 mL/min/{1.73_m2} (ref >59)

## 2023-07-09 LAB — TSH: TSH: 2.73 u[IU]/mL (ref 0.450–4.500)

## 2023-07-15 ENCOUNTER — Ambulatory Visit
Admission: RE | Admit: 2023-07-15 | Discharge: 2023-07-15 | Disposition: A | Discharge status: Home / Self Care | Payer: Medicare HMO | Source: Ambulatory Visit | Attending: Internal Medicine | Admitting: Internal Medicine

## 2023-07-15 DIAGNOSIS — Z1231 Encounter for screening mammogram for malignant neoplasm of breast: Secondary | ICD-10-CM | POA: Insufficient documentation

## 2023-07-29 DIAGNOSIS — M25551 Pain in right hip: Secondary | ICD-10-CM | POA: Diagnosis not present

## 2023-07-29 DIAGNOSIS — G8929 Other chronic pain: Secondary | ICD-10-CM | POA: Diagnosis not present

## 2023-08-11 DIAGNOSIS — J449 Chronic obstructive pulmonary disease, unspecified: Secondary | ICD-10-CM | POA: Diagnosis not present

## 2023-08-16 DIAGNOSIS — M25551 Pain in right hip: Secondary | ICD-10-CM | POA: Diagnosis not present

## 2023-08-16 DIAGNOSIS — M5441 Lumbago with sciatica, right side: Secondary | ICD-10-CM | POA: Diagnosis not present

## 2023-08-16 DIAGNOSIS — G8929 Other chronic pain: Secondary | ICD-10-CM | POA: Diagnosis not present

## 2023-08-23 DIAGNOSIS — R0609 Other forms of dyspnea: Secondary | ICD-10-CM | POA: Diagnosis not present

## 2023-08-23 DIAGNOSIS — J449 Chronic obstructive pulmonary disease, unspecified: Secondary | ICD-10-CM | POA: Diagnosis not present

## 2023-08-23 DIAGNOSIS — R0982 Postnasal drip: Secondary | ICD-10-CM | POA: Diagnosis not present

## 2023-09-06 ENCOUNTER — Other Ambulatory Visit: Payer: Self-pay | Admitting: Internal Medicine

## 2023-09-06 DIAGNOSIS — M5441 Lumbago with sciatica, right side: Secondary | ICD-10-CM

## 2023-09-06 NOTE — Telephone Encounter (Signed)
Medication Refill -  Most Recent Primary Care Visit:  Provider: Reubin Milan  Department: [facility]  Visit Type: PHYSICAL  Date: 07/08/2023  Medication: gabapentin (NEURONTIN) 100 MG capsule   Has the patient contacted their pharmacy? No,pt thought she needed to call in do Dr  Is this the correct pharmacy for this prescription? yes This is the patient's preferred pharmacy:  [facility] [address] ([address]), [place] - [address]) [place] [address] Phone: [phone] Fax: [phone]    Has the prescription been filled recently? no  Is the patient out of the medication? No has 3 or 4 left  Has the patient been seen for an appointment in the last year OR does the patient have an upcoming appointment? yes  Can we respond through MyChart? yes  Agent: Please be advised that Rx refills may take up to 3 business days. We ask that you follow-up with your pharmacy.

## 2023-09-10 MED ORDER — GABAPENTIN 100 MG PO CAPS: 100.0000 mg | ORAL_CAPSULE | Freq: Every day | ORAL | 1 refills | Status: DC

## 2023-09-10 NOTE — Telephone Encounter (Signed)
 Requested Prescriptions  Pending Prescriptions Disp Refills   gabapentin  (NEURONTIN ) 100 MG capsule 90 capsule 1    Sig: Take 1 capsule (100 mg total) by mouth at bedtime.     Neurology: Anticonvulsants - gabapentin  Passed - 09/10/2023  3:15 PM      Passed - Cr in normal range and within 360 days    Creatinine, Ser  Date Value Ref Range Status  07/08/2023 0.80 0.57 - 1.00 mg/dL Final         Passed - Completed PHQ-2 or PHQ-9 in the last 360 days      Passed - Valid encounter within last 12 months    Recent Outpatient Visits           2 months ago Annual physical exam   [place] [facility] at [facility], Leita DEL, MD   4 months ago Acute non-recurrent maxillary sinusitis   [place] [facility] at [facility], Leita DEL, MD   8 months ago Gastro-esophageal reflux disease without esophagitis   [place] [facility] at [facility], Leita DEL, MD   1 year ago Annual physical exam   [facility] at [facility], Leita DEL, MD   1 year ago Pulmonary emphysema, unspecified emphysema type [facility])   [place] [facility] at [facility], Leita DEL, MD       Future Appointments             In 10 months Justus Leita DEL, MD [facility] at [facility]

## 2023-09-22 ENCOUNTER — Ambulatory Visit: Payer: Medicare HMO | Admitting: Internal Medicine

## 2023-09-22 ENCOUNTER — Encounter: Payer: Self-pay | Admitting: Internal Medicine

## 2023-09-22 VITALS — BP 124/76 | HR 80 | Temp 97.9°F | Ht 62.0 in | Wt 163.2 lb

## 2023-09-22 DIAGNOSIS — J0100 Acute maxillary sinusitis, unspecified: Secondary | ICD-10-CM

## 2023-09-22 DIAGNOSIS — J069 Acute upper respiratory infection, unspecified: Secondary | ICD-10-CM

## 2023-09-22 DIAGNOSIS — B9789 Other viral agents as the cause of diseases classified elsewhere: Secondary | ICD-10-CM | POA: Diagnosis not present

## 2023-09-22 MED ORDER — AZITHROMYCIN 250 MG PO TABS: ORAL_TABLET | ORAL | 0 refills | Status: AC

## 2023-09-22 NOTE — Progress Notes (Signed)
 Date:  09/22/2023   Name:  Monica Campbell   DOB:  05-15-1941   MRN:  [id number]   Chief Complaint: Sinusitis (X 5 days.Headache, Sinus pressure and pain, sore throat, chest congestion, and cough with green mucous. No fever.)  Sinus Problem This is a new problem. Episode onset: 5 days ago. The problem is unchanged. There has been no fever. The pain is mild. Associated symptoms include congestion, coughing, diaphoresis, headaches and shortness of breath. Pertinent negatives include no chills, ear pain or sore throat. The treatment provided no relief.  Cough This is a new problem. Episode onset: 5 days ago. The problem occurs every few hours. The cough is Non-productive. Associated symptoms include headaches, nasal congestion, shortness of breath and wheezing. Pertinent negatives include no chest pain, chills, ear pain, fever or sore throat. Her past medical history is significant for COPD.    Review of Systems  Constitutional:  Positive for diaphoresis. Negative for chills and fever.  HENT:  Positive for congestion. Negative for ear pain and sore throat.   Eyes:  Negative for visual disturbance.  Respiratory:  Positive for cough, shortness of breath and wheezing.   Cardiovascular:  Negative for chest pain and palpitations.  Gastrointestinal:  Negative for diarrhea, nausea and vomiting.  Neurological:  Positive for headaches. Negative for dizziness and light-headedness.  Psychiatric/Behavioral:  Negative for sleep disturbance.      Lab Results  Component Value Date   NA 139 07/08/2023   K 4.1 07/08/2023   CO2 26 07/08/2023   GLUCOSE 91 07/08/2023   BUN 13 07/08/2023   CREATININE 0.80 07/08/2023   CALCIUM 9.4 07/08/2023   EGFR 74 07/08/2023   GFRNONAA 71 06/04/2020   Lab Results  Component Value Date   CHOL 239 (H) 07/08/2023   HDL 58 07/08/2023   LDLCALC 149 (H) 07/08/2023   TRIG 177 (H) 07/08/2023   CHOLHDL 4.1 07/08/2023   Lab Results  Component Value Date   TSH  2.730 07/08/2023   No results found for: "HGBA1C" Lab Results  Component Value Date   WBC 8.2 07/08/2023   HGB 12.9 07/08/2023   HCT 39.2 07/08/2023   MCV 90 07/08/2023   PLT 279 07/08/2023   Lab Results  Component Value Date   ALT 7 07/08/2023   AST 16 07/08/2023   ALKPHOS 69 07/08/2023   BILITOT 0.3 07/08/2023   Lab Results  Component Value Date   VD25OH 39.6 06/06/2021     Patient Active Problem List   Diagnosis Date Noted   Skin tag of anus 07/08/2023   Lumbar stenosis with neurogenic claudication 06/04/2020   Ankle edema, bilateral 06/04/2020   Senile ecchymosis 06/04/2020   Arthritis of both acromioclavicular joints 09/05/2019   Bursitis of right shoulder 07/05/2019   Venous insufficiency of right leg 06/21/2019   Myalgia 06/21/2019   Vitamin D  deficiency 06/21/2019   Carpal tunnel syndrome 04/10/2019   Acquired trigger finger 04/03/2019   Elevated TSH 05/03/2017   Age-related osteoporosis without current pathological fracture 03/24/2016   Chronic dermatitis 07/09/2015   Allergic rhinitis, seasonal 12/12/2014   Gastro-esophageal reflux disease without esophagitis 12/12/2014   Mixed hyperlipidemia 12/12/2014   Degenerative arthritis of hip 12/12/2014   Chronic obstructive pulmonary disease, unspecified (HCC) 10/11/2014   Bilateral hearing loss 07/15/2014    Allergies  Allergen Reactions   Augmentin  [Amoxicillin -Pot Clavulanate] Itching    Itching all over.   Codeine Rash    Past Surgical History:  Procedure Laterality Date  CATARACT EXTRACTION W/PHACO Left 02/02/2018   Procedure: CATARACT EXTRACTION PHACO AND INTRAOCULAR LENS PLACEMENT (IOC) LEFT  MALYUGIN;  Surgeon: Annell Kidney, MD;  Location: [facility];  Service: Ophthalmology;  Laterality: Left;   CATARACT EXTRACTION W/PHACO Right 05/03/2018   Procedure: CATARACT EXTRACTION PHACO AND INTRAOCULAR LENS PLACEMENT (IOC) RIGHT  IVA/TOPICAL;  Surgeon: Annell Kidney, MD;  Location:  [facility];  Service: Ophthalmology;  Laterality: Right;   MINOR HEMORRHOIDECTOMY      Social History   Tobacco Use   Smoking status: Former    Current packs/day: 0.00    Average packs/day: 0.5 packs/day for 30.0 years (15.0 ttl pk-yrs)    Types: Cigarettes    Start date: 09/07/1970    Quit date: 09/07/2000    Years since quitting: 23.0   Smokeless tobacco: Never   Tobacco comments:    smoking cessation materials not required  Vaping Use   Vaping status: Never Used  Substance Use Topics   Alcohol use: Yes    Alcohol/week: 1.0 standard drink of alcohol    Types: 1 Glasses of wine per week    Comment: social   Drug use: No     Medication list has been reviewed and updated.  Current Meds  Medication Sig   acetaminophen  (TYLENOL ) 650 MG CR tablet Take 650 mg by mouth every 8 (eight) hours as needed for pain.   albuterol  (PROVENTIL ) (2.5 MG/3ML) 0.083% nebulizer solution Take 3 mLs by nebulization every 6 (six) hours as needed for wheezing or shortness of breath.   albuterol  (VENTOLIN  HFA) 108 (90 Base) MCG/ACT inhaler INHALE 2 PUFFS BY MOUTH EVERY 6 HOURS AS NEEDED FOR WHEEZING OR  SHORTNESS  OF  BREATH   aspirin  81 MG chewable tablet Chew 1 tablet by mouth daily.   azithromycin  (ZITHROMAX  Z-PAK) 250 MG tablet UAD   blood glucose meter kit and supplies KIT Dispense based on patient and insurance preference. Use up to four times daily as directed.   Calcium Carbonate-Vit D-Min (CALTRATE 600+D PLUS MINERALS) 600-800 MG-UNIT CHEW Chew 1 tablet by mouth daily.   Cholecalciferol (VITAMIN D ) 125 MCG (5000 UT) CAPS Take 1 capsule by mouth daily.   docusate sodium (COLACE) 100 MG capsule Take 1 capsule by mouth daily.   fluticasone  (FLONASE ) 50 MCG/ACT nasal spray Use 2 spray(s) in each nostril once daily   gabapentin  (NEURONTIN ) 100 MG capsule Take 1 capsule (100 mg total) by mouth at bedtime.   TRELEGY ELLIPTA  100-62.5-25 MCG/ACT AEPB INHALE 1 PUFF INTO THE LUNGS ONCE DAILY    triamterene -hydrochlorothiazide (MAXZIDE-25) 37.5-25 MG tablet Take 1 tablet by mouth daily.   zinc gluconate 50 MG tablet Take 50 mg by mouth daily.       09/22/2023   10:20 AM 07/08/2023   10:59 AM 04/19/2023    1:56 PM 12/29/2022    3:24 PM  GAD 7 : Generalized Anxiety Score  Nervous, Anxious, on Edge 3 1 1 1   Control/stop worrying 3 1 1 1   Worry too much - different things 3 1 1 1   Trouble relaxing 3 0 0 1  Restless 3 1 0 0  Easily annoyed or irritable 2 0 0 1  Afraid - awful might happen 0 1 0 1  Total GAD 7 Score 17 5 3 6   Anxiety Difficulty Somewhat difficult Not difficult at all Not difficult at all Somewhat difficult       09/22/2023   10:19 AM 07/08/2023   10:59 AM 05/04/2023  3:33 PM  Depression screen PHQ 2/9  Decreased Interest 3 0 1  Down, Depressed, Hopeless 3 0 1  PHQ - 2 Score 6 0 2  Altered sleeping 1 0 0  Tired, decreased energy 3 1 1   Change in appetite 3 0 0  Feeling bad or failure about yourself  0 0 0  Trouble concentrating 3 1 0  Moving slowly or fidgety/restless 3 1 0  Suicidal thoughts 0 0 0  PHQ-9 Score 19 3 3   Difficult doing work/chores Somewhat difficult Somewhat difficult Not difficult at all    BP Readings from Last 3 Encounters:  09/22/23 124/76  07/08/23 132/74  04/19/23 112/70    Physical Exam Vitals and nursing note reviewed.  Constitutional:      General: She is not in acute distress.    Appearance: Normal appearance. She is well-developed.  HENT:     Head: Normocephalic and atraumatic.     Right Ear: Tympanic membrane normal. Decreased hearing noted.     Left Ear: Tympanic membrane normal. Decreased hearing noted.     Nose:     Right Sinus: Maxillary sinus tenderness present.     Left Sinus: Maxillary sinus tenderness present.     Mouth/Throat:     Pharynx: Posterior oropharyngeal erythema present. No oropharyngeal exudate.  Cardiovascular:     Rate and Rhythm: Normal rate and regular rhythm.  Pulmonary:     Effort:  Pulmonary effort is normal. No respiratory distress.     Breath sounds: Normal breath sounds. No decreased air movement. No decreased breath sounds or wheezing.  Musculoskeletal:     Cervical back: Normal range of motion.  Lymphadenopathy:     Cervical: No cervical adenopathy.  Skin:    General: Skin is warm and dry.     Findings: No rash.  Neurological:     Mental Status: She is alert and oriented to person, place, and time.  Psychiatric:        Mood and Affect: Mood normal.        Behavior: Behavior normal.     Wt Readings from Last 3 Encounters:  09/22/23 163 lb 3.2 oz (74 kg)  07/08/23 167 lb (75.8 kg)  04/19/23 166 lb (75.3 kg)    BP 124/76   Pulse 80   Temp 97.9 F (36.6 C) (Oral)   Ht 5\' 2"  (1.575 m)   Wt 163 lb 3.2 oz (74 kg)   SpO2 95%   BMI 29.85 kg/m   Assessment and Plan:  Problem List Items Addressed This Visit   None Visit Diagnoses       Viral URI with cough    -  Primary   continue Delsym; continue inhalers for COPD   Relevant Medications   azithromycin  (ZITHROMAX  Z-PAK) 250 MG tablet     Acute non-recurrent maxillary sinusitis       continue nasal spray; fluids follow up if needed   Relevant Medications   azithromycin  (ZITHROMAX  Z-PAK) 250 MG tablet       No follow-ups on file.    Sheron Dixons, MD [facility] [place]

## 2023-09-29 ENCOUNTER — Other Ambulatory Visit: Payer: Medicare HMO

## 2023-10-27 ENCOUNTER — Encounter: Payer: Medicare HMO | Admitting: Internal Medicine

## 2023-11-17 ENCOUNTER — Encounter: Payer: Self-pay | Admitting: Internal Medicine

## 2023-11-17 ENCOUNTER — Ambulatory Visit
Admission: RE | Admit: 2023-11-17 | Discharge: 2023-11-17 | Disposition: A | Discharge status: Home / Self Care | Payer: Medicare HMO | Source: Ambulatory Visit | Attending: Internal Medicine | Admitting: Internal Medicine

## 2023-11-17 DIAGNOSIS — Z780 Asymptomatic menopausal state: Secondary | ICD-10-CM | POA: Insufficient documentation

## 2023-11-26 ENCOUNTER — Encounter: Payer: Self-pay | Admitting: Internal Medicine

## 2023-11-26 ENCOUNTER — Ambulatory Visit: Payer: Self-pay

## 2023-11-26 ENCOUNTER — Ambulatory Visit (INDEPENDENT_AMBULATORY_CARE_PROVIDER_SITE_OTHER): Admitting: Internal Medicine

## 2023-11-26 VITALS — BP 124/68 | HR 92 | Temp 98.5°F | Ht 62.0 in | Wt 160.5 lb

## 2023-11-26 DIAGNOSIS — J439 Emphysema, unspecified: Secondary | ICD-10-CM | POA: Diagnosis not present

## 2023-11-26 DIAGNOSIS — J441 Chronic obstructive pulmonary disease with (acute) exacerbation: Secondary | ICD-10-CM | POA: Diagnosis not present

## 2023-11-26 MED ORDER — PREDNISONE 10 MG PO TABS: ORAL_TABLET | ORAL | 0 refills | Status: AC

## 2023-11-26 MED ORDER — PROMETHAZINE-DM 6.25-15 MG/5ML PO SYRP: 5.0000 mL | ORAL_SOLUTION | Freq: Four times a day (QID) | ORAL | 0 refills | Status: AC | PRN

## 2023-11-26 MED ORDER — ALBUTEROL SULFATE (2.5 MG/3ML) 0.083% IN NEBU
3.0000 mL | INHALATION_SOLUTION | Freq: Four times a day (QID) | RESPIRATORY_TRACT | 5 refills | Status: AC | PRN
Start: 2023-11-26 — End: ?

## 2023-11-26 MED ORDER — DOXYCYCLINE HYCLATE 100 MG PO TABS: 100.0000 mg | ORAL_TABLET | Freq: Two times a day (BID) | ORAL | 0 refills | Status: AC

## 2023-11-26 NOTE — Patient Instructions (Signed)
 Please rest and drink plenty of fluids. Use your nebulizer at least three times per day. Follow up not improving.

## 2023-11-26 NOTE — Telephone Encounter (Signed)
 Copied from CRM [id number]. Topic: Clinical - Red Word Triage >> Nov 26, 2023  8:18 AM Gaetano Hawthorne wrote: [facility] that prompted transfer to Nurse Triage: increasing worsening cough x 3 weeks, soreness on left side of throat, patient stated that there is white stuff in her mouth, it is also difficult to swallow, patient may have had a fever in the past.  Chief Complaint: cough, sore throat Symptoms: cough-productive w/green sputum,sore throat, white patches in throat Frequency: x 3 weeks and worsening Pertinent Negatives: Patient denies fever Disposition: [] ED /[] Urgent Care (no appt availability in office) / [x] Appointment(In office/virtual)/ []  [place] [facility]/ [] Home Care/ [] Refused Recommended Disposition /[] [place] [facility]/ []  Follow-up with PCP Additional Notes: pt reports teeth hurt, nasal & chest congestion, tired, feels bad all over. SOB with exertion  Answer Assessment - Initial Assessment Questions 1. ONSET: "When did the cough begin?"      X 3 weeks 2. SEVERITY: "How bad is the cough today?"      severe 3. SPUTUM: "Describe the color of your sputum" (none, dry cough; clear, white, yellow, green)     greenish 4. HEMOPTYSIS: "Are you coughing up any blood?" If so ask: "How much?" (flecks, streaks, tablespoons, etc.)     no 5. DIFFICULTY BREATHING: "Are you having difficulty breathing?" If Yes, ask: "How bad is it?" (e.g., mild, moderate, severe)    - MILD: No SOB at rest, mild SOB with walking, speaks normally in sentences, can lie down, no retractions, pulse < 100.    - MODERATE: SOB at rest, SOB with minimal exertion and prefers to sit, cannot lie down flat, speaks in phrases, mild retractions, audible wheezing, pulse 100-120.    - SEVERE: Very SOB at rest, speaks in single words, struggling to breathe, sitting hunched forward, retractions, pulse > 120      Mild  6. FEVER: "Do you have a fever?" If Yes, ask: "What is your temperature, how was it measured, and when did it  start?"     no 7. CARDIAC HISTORY: "Do you have any history of heart disease?" (e.g., heart attack, congestive heart failure)      N/a 8. LUNG HISTORY: "Do you have any history of lung disease?"  (e.g., pulmonary embolus, asthma, emphysema)     N/a 9. PE RISK FACTORS: "Do you have a history of blood clots?" (or: recent major surgery, recent prolonged travel, bedridden)     N/a 10. OTHER SYMPTOMS: "Do you have any other symptoms?" (e.g., runny nose, wheezing, chest pain)       Nasal and chest congestion, chest hurts when cough, ears hurt, sore throat, white patch in throat 11. PREGNANCY: "Is there any chance you are pregnant?" "When was your last menstrual period?"       no 12. TRAVEL: "Have you traveled out of the country in the last month?" (e.g., travel history, exposures)       N/a  Answer Assessment - Initial Assessment Questions 1. ONSET: "When did the throat start hurting?" (Hours or days ago)      Approx 1-2 weeks 2. SEVERITY: "How bad is the sore throat?" (Scale 1-10; mild, moderate or severe)   - MILD (1-3):  Doesn't interfere with eating or normal activities.   - MODERATE (4-7): Interferes with eating some solids and normal activities.   - SEVERE (8-10):  Excruciating pain, interferes with most normal activities.   - SEVERE WITH DYSPHAGIA (10): Can't swallow liquids, drooling.     moderate 3. STREP  EXPOSURE: "Has there been any exposure to strep within the past week?" If Yes, ask: "What type of contact occurred?"      no 4.  VIRAL SYMPTOMS: "Are there any symptoms of a cold, such as a runny nose, cough, hoarse voice or red eyes?"      Cough, chest and nasal congestion  5. FEVER: "Do you have a fever?" If Yes, ask: "What is your temperature, how was it measured, and when did it start?"     no 6. PUS ON THE TONSILS: "Is there pus on the tonsils in the back of your throat?"     White patches and soreness 7. OTHER SYMPTOMS: "Do you have any other symptoms?" (e.g., difficulty  breathing, headache, rash)     SOB with exertion 8. PREGNANCY: "Is there any chance you are pregnant?" "When was your last menstrual period?"     N/a  Protocols used: Cough - Acute Productive-A-AH, Sore Throat-A-AH

## 2023-11-26 NOTE — Progress Notes (Signed)
 Date:  11/26/2023   Name:  Monica Campbell   DOB:  1941/02/20   MRN:  [id number]   Chief Complaint: Generalized Body Aches (Patient said she started 4 days ago, nasal congestion) and Cough (Patient said she has had the cough for a couple of weeks, no fever)  Cough This is a new problem. The current episode started 1 to 4 weeks ago. The problem has been gradually worsening. The problem occurs every few minutes. The cough is Productive of sputum. Associated symptoms include headaches, postnasal drip, a sore throat, shortness of breath, weight loss and wheezing. Pertinent negatives include no chest pain, chills, ear congestion, fever or nasal congestion. Nothing aggravates the symptoms. She has tried a beta-agonist inhaler for the symptoms. The treatment provided mild relief. Her past medical history is significant for COPD.    Review of Systems  Constitutional:  Positive for fatigue, unexpected weight change and weight loss. Negative for chills and fever.  HENT:  Positive for postnasal drip and sore throat. Negative for trouble swallowing.   Eyes:  Negative for visual disturbance.  Respiratory:  Positive for cough, chest tightness, shortness of breath and wheezing.   Cardiovascular:  Negative for chest pain and palpitations.  Gastrointestinal:  Negative for abdominal pain, diarrhea and vomiting.  Musculoskeletal:  Positive for arthralgias.  Neurological:  Positive for headaches. Negative for dizziness.  Hematological:  Negative for adenopathy.  Psychiatric/Behavioral:  Positive for dysphoric mood (brother in ICU critical) and sleep disturbance. The patient is nervous/anxious.      Lab Results  Component Value Date   NA 139 07/08/2023   K 4.1 07/08/2023   CO2 26 07/08/2023   GLUCOSE 91 07/08/2023   BUN 13 07/08/2023   CREATININE 0.80 07/08/2023   CALCIUM 9.4 07/08/2023   EGFR 74 07/08/2023   GFRNONAA 71 06/04/2020   Lab Results  Component Value Date   CHOL 239 (H) 07/08/2023    HDL 58 07/08/2023   LDLCALC 149 (H) 07/08/2023   TRIG 177 (H) 07/08/2023   CHOLHDL 4.1 07/08/2023   Lab Results  Component Value Date   TSH 2.730 07/08/2023   No results found for: "HGBA1C" Lab Results  Component Value Date   WBC 8.2 07/08/2023   HGB 12.9 07/08/2023   HCT 39.2 07/08/2023   MCV 90 07/08/2023   PLT 279 07/08/2023   Lab Results  Component Value Date   ALT 7 07/08/2023   AST 16 07/08/2023   ALKPHOS 69 07/08/2023   BILITOT 0.3 07/08/2023   Lab Results  Component Value Date   VD25OH 39.6 06/06/2021     Patient Active Problem List   Diagnosis Date Noted   Skin tag of anus 07/08/2023   Lumbar stenosis with neurogenic claudication 06/04/2020   Ankle edema, bilateral 06/04/2020   Senile ecchymosis 06/04/2020   Arthritis of both acromioclavicular joints 09/05/2019   Bursitis of right shoulder 07/05/2019   Venous insufficiency of right leg 06/21/2019   Myalgia 06/21/2019   Vitamin D deficiency 06/21/2019   Carpal tunnel syndrome 04/10/2019   Acquired trigger finger 04/03/2019   Elevated TSH 05/03/2017   Age-related osteoporosis without current pathological fracture 03/24/2016   Chronic dermatitis 07/09/2015   Allergic rhinitis, seasonal 12/12/2014   Gastro-esophageal reflux disease without esophagitis 12/12/2014   Mixed hyperlipidemia 12/12/2014   Degenerative arthritis of hip 12/12/2014   Chronic obstructive pulmonary disease, unspecified (HCC) 10/11/2014   Bilateral hearing loss 07/15/2014    Allergies  Allergen Reactions   Augmentin [Amoxicillin-Pot  Clavulanate] Itching    Itching all over.   Codeine Rash    Past Surgical History:  Procedure Laterality Date   CATARACT EXTRACTION W/PHACO Left 02/02/2018   Procedure: CATARACT EXTRACTION PHACO AND INTRAOCULAR LENS PLACEMENT (IOC) LEFT  MALYUGIN;  Surgeon: Lockie Mola, MD;  Location: [facility];  Service: Ophthalmology;  Laterality: Left;   CATARACT EXTRACTION W/PHACO Right  05/03/2018   Procedure: CATARACT EXTRACTION PHACO AND INTRAOCULAR LENS PLACEMENT (IOC) RIGHT  IVA/TOPICAL;  Surgeon: Lockie Mola, MD;  Location: [facility];  Service: Ophthalmology;  Laterality: Right;   MINOR HEMORRHOIDECTOMY      Social History   Tobacco Use   Smoking status: Former    Current packs/day: 0.00    Average packs/day: 0.5 packs/day for 30.0 years (15.0 ttl pk-yrs)    Types: Cigarettes    Start date: 09/07/1970    Quit date: 09/07/2000    Years since quitting: 23.2   Smokeless tobacco: Never   Tobacco comments:    smoking cessation materials not required  Vaping Use   Vaping status: Never Used  Substance Use Topics   Alcohol use: Yes    Alcohol/week: 1.0 standard drink of alcohol    Types: 1 Glasses of wine per week    Comment: social   Drug use: No     Medication list has been reviewed and updated.  Current Meds  Medication Sig   acetaminophen (TYLENOL) 650 MG CR tablet Take 650 mg by mouth every 8 (eight) hours as needed for pain.   albuterol (VENTOLIN HFA) 108 (90 Base) MCG/ACT inhaler INHALE 2 PUFFS BY MOUTH EVERY 6 HOURS AS NEEDED FOR WHEEZING OR  SHORTNESS  OF  BREATH   aspirin 81 MG chewable tablet Chew 1 tablet by mouth daily.   blood glucose meter kit and supplies KIT Dispense based on patient and insurance preference. Use up to four times daily as directed.   Calcium Carbonate-Vit D-Min (CALTRATE 600+D PLUS MINERALS) 600-800 MG-UNIT CHEW Chew 1 tablet by mouth daily.   Cholecalciferol (VITAMIN D) 125 MCG (5000 UT) CAPS Take 1 capsule by mouth daily.   docusate sodium (COLACE) 100 MG capsule Take 1 capsule by mouth daily.   doxycycline (VIBRA-TABS) 100 MG tablet Take 1 tablet (100 mg total) by mouth 2 (two) times daily for 10 days.   fluticasone (FLONASE) 50 MCG/ACT nasal spray Use 2 spray(s) in each nostril once daily   gabapentin (NEURONTIN) 100 MG capsule Take 1 capsule (100 mg total) by mouth at bedtime.   predniSONE (DELTASONE) 10 MG  tablet Take 6 tablets (60 mg total) by mouth daily with breakfast for 1 day, THEN 5 tablets (50 mg total) daily with breakfast for 1 day, THEN 4 tablets (40 mg total) daily with breakfast for 1 day, THEN 3 tablets (30 mg total) daily with breakfast for 1 day, THEN 2 tablets (20 mg total) daily with breakfast for 1 day, THEN 1 tablet (10 mg total) daily with breakfast for 1 day.   promethazine-dextromethorphan (PROMETHAZINE-DM) 6.25-15 MG/5ML syrup Take 5 mLs by mouth 4 (four) times daily as needed for up to 9 days for cough.   TRELEGY ELLIPTA 100-62.5-25 MCG/ACT AEPB INHALE 1 PUFF INTO THE LUNGS ONCE DAILY   triamterene-hydrochlorothiazide (MAXZIDE-25) 37.5-25 MG tablet Take 1 tablet by mouth daily.   zinc gluconate 50 MG tablet Take 50 mg by mouth daily.   [DISCONTINUED] albuterol (PROVENTIL) (2.5 MG/3ML) 0.083% nebulizer solution Take 3 mLs by nebulization every 6 (six) hours as needed  for wheezing or shortness of breath.       11/26/2023    9:49 AM 09/22/2023   10:20 AM 07/08/2023   10:59 AM 04/19/2023    1:56 PM  GAD 7 : Generalized Anxiety Score  Nervous, Anxious, on Edge 3 3 1 1   Control/stop worrying 3 3 1 1   Worry too much - different things 3 3 1 1   Trouble relaxing 3 3 0 0  Restless 3 3 1  0  Easily annoyed or irritable 3 2 0 0  Afraid - awful might happen 3 0 1 0  Total GAD 7 Score 21 17 5 3   Anxiety Difficulty  Somewhat difficult Not difficult at all Not difficult at all       11/26/2023    9:49 AM 09/22/2023   10:19 AM 07/08/2023   10:59 AM  Depression screen PHQ 2/9  Decreased Interest 3 3 0  Down, Depressed, Hopeless 3 3 0  PHQ - 2 Score 6 6 0  Altered sleeping 2 1 0  Tired, decreased energy 3 3 1   Change in appetite 3 3 0  Feeling bad or failure about yourself  3 0 0  Trouble concentrating 0 3 1  Moving slowly or fidgety/restless 3 3 1   Suicidal thoughts 0 0 0  PHQ-9 Score 20 19 3   Difficult doing work/chores  Somewhat difficult Somewhat difficult    BP  Readings from Last 3 Encounters:  11/26/23 124/68  09/22/23 124/76  07/08/23 132/74    Physical Exam Constitutional:      Appearance: She is ill-appearing.  Cardiovascular:     Rate and Rhythm: Normal rate and regular rhythm.     Pulses: Normal pulses.  Pulmonary:     Breath sounds: Decreased air movement and transmitted upper airway sounds present. Examination of the right-upper field reveals wheezing. Examination of the left-upper field reveals wheezing. Decreased breath sounds and wheezing present. No rhonchi.  Musculoskeletal:     Cervical back: Normal range of motion.  Lymphadenopathy:     Cervical: No cervical adenopathy.  Neurological:     Mental Status: She is alert and oriented to person, place, and time.  Psychiatric:        Attention and Perception: Attention normal.     Wt Readings from Last 3 Encounters:  11/26/23 160 lb 8 oz (72.8 kg)  09/22/23 163 lb 3.2 oz (74 kg)  07/08/23 167 lb (75.8 kg)    BP 124/68   Pulse 92   Temp 98.5 F (36.9 C)   Ht 5\' 2"  (1.575 m)   Wt 160 lb 8 oz (72.8 kg)   SpO2 94%   BMI 29.36 kg/m   Assessment and Plan:  Problem List Items Addressed This Visit       Unprioritized   Chronic obstructive pulmonary disease, unspecified (HCC) (Chronic)   Relevant Medications   predniSONE (DELTASONE) 10 MG tablet   promethazine-dextromethorphan (PROMETHAZINE-DM) 6.25-15 MG/5ML syrup   albuterol (PROVENTIL) (2.5 MG/3ML) 0.083% nebulizer solution   Other Visit Diagnoses       Acute exacerbation of chronic obstructive pulmonary disease (COPD) (HCC)    -  Primary   Relevant Medications   doxycycline (VIBRA-TABS) 100 MG tablet   predniSONE (DELTASONE) 10 MG tablet   promethazine-dextromethorphan (PROMETHAZINE-DM) 6.25-15 MG/5ML syrup   albuterol (PROVENTIL) (2.5 MG/3ML) 0.083% nebulizer solution       No follow-ups on file.    Reubin Milan, MD [facility] [place]

## 2023-11-26 NOTE — Telephone Encounter (Signed)
Noted  Pt has an appointment today.  KP 

## 2024-02-25 ENCOUNTER — Telehealth: Payer: Self-pay

## 2024-02-25 NOTE — Telephone Encounter (Signed)
 Copied from CRM [id number]. Topic: Clinical - Medication Question >> Feb 25, 2024  9:19 AM Monica Campbell wrote: Reason for CRM:  Monica Campbell would like for Monica Campbell to call in a refill for doxycycline  (MONODOX ) 100 MG and predniSONE  (DELTASONE ) 10 MG tablet for her sore throat. The sore throat is like the one she had in November 26, 2023.  Please call Monica Campbell to let her know if you can call this in. Her number is [phone]. Thanks  -----------------------------------------------------------------------

## 2024-02-25 NOTE — Telephone Encounter (Signed)
 Pt informed she needs to come in. She declined appt today saying she does not feel like going out. Told her if she gets worse to be sure to seek care at Urgent care this weekend.  - Deyton Ellenbecker M

## 2024-02-28 ENCOUNTER — Ambulatory Visit (INDEPENDENT_AMBULATORY_CARE_PROVIDER_SITE_OTHER): Admitting: Internal Medicine

## 2024-02-28 ENCOUNTER — Encounter: Payer: Self-pay | Admitting: Internal Medicine

## 2024-02-28 VITALS — BP 124/68 | HR 91 | Temp 98.1°F | Ht 62.0 in | Wt 155.0 lb

## 2024-02-28 DIAGNOSIS — J0100 Acute maxillary sinusitis, unspecified: Secondary | ICD-10-CM | POA: Diagnosis not present

## 2024-02-28 DIAGNOSIS — R051 Acute cough: Secondary | ICD-10-CM

## 2024-02-28 DIAGNOSIS — E0849 Diabetes mellitus due to underlying condition with other diabetic neurological complication: Secondary | ICD-10-CM | POA: Diagnosis not present

## 2024-02-28 MED ORDER — BENZONATATE 100 MG PO CAPS: 100.0000 mg | ORAL_CAPSULE | Freq: Three times a day (TID) | ORAL | 0 refills | Status: AC

## 2024-02-28 MED ORDER — DOXYCYCLINE HYCLATE 100 MG PO TABS: 100.0000 mg | ORAL_TABLET | Freq: Two times a day (BID) | ORAL | 0 refills | Status: AC

## 2024-02-28 NOTE — Progress Notes (Signed)
 Date:  02/28/2024   Name:  Monica Campbell   DOB:  07-13-1941   MRN:  [id number]   Chief Complaint: Cough (Patient said she mowed her lawn Thursday and after that day she started having a cough, nasal drainage, using home remedy for cough, fever at night (99.6 highest))  Cough This is a new problem. The current episode started in the past 7 days. The problem has been unchanged. The problem occurs every few minutes. The cough is Productive of sputum. Associated symptoms include nasal congestion, postnasal drip and a sore throat. Pertinent negatives include no chest pain, chills, ear pain, fever, shortness of breath or wheezing. The symptoms are aggravated by pollens (started after cutting grass). She has tried OTC cough suppressant (and sudafed) for the symptoms.    Review of Systems  Constitutional:  Positive for fatigue. Negative for chills, fever and unexpected weight change.  HENT:  Positive for postnasal drip, sinus pressure and sore throat. Negative for ear pain.   Respiratory:  Positive for cough. Negative for chest tightness, shortness of breath and wheezing.   Cardiovascular:  Negative for chest pain and palpitations.  Gastrointestinal:  Negative for abdominal pain and diarrhea.  Psychiatric/Behavioral:  Negative for dysphoric mood and sleep disturbance. The patient is not nervous/anxious.      Lab Results  Component Value Date   NA 139 07/08/2023   K 4.1 07/08/2023   CO2 26 07/08/2023   GLUCOSE 91 07/08/2023   BUN 13 07/08/2023   CREATININE 0.80 07/08/2023   CALCIUM 9.4 07/08/2023   EGFR 74 07/08/2023   GFRNONAA 71 06/04/2020   Lab Results  Component Value Date   CHOL 239 (H) 07/08/2023   HDL 58 07/08/2023   LDLCALC 149 (H) 07/08/2023   TRIG 177 (H) 07/08/2023   CHOLHDL 4.1 07/08/2023   Lab Results  Component Value Date   TSH 2.730 07/08/2023   No results found for: HGBA1C Lab Results  Component Value Date   WBC 8.2 07/08/2023   HGB 12.9 07/08/2023    HCT 39.2 07/08/2023   MCV 90 07/08/2023   PLT 279 07/08/2023   Lab Results  Component Value Date   ALT 7 07/08/2023   AST 16 07/08/2023   ALKPHOS 69 07/08/2023   BILITOT 0.3 07/08/2023   Lab Results  Component Value Date   VD25OH 39.6 06/06/2021     Patient Active Problem List   Diagnosis Date Noted   Skin tag of anus 07/08/2023   Lumbar stenosis with neurogenic claudication 06/04/2020   Ankle edema, bilateral 06/04/2020   Senile ecchymosis 06/04/2020   Arthritis of both acromioclavicular joints 09/05/2019   Bursitis of right shoulder 07/05/2019   Venous insufficiency of right leg 06/21/2019   Myalgia 06/21/2019   Vitamin D  deficiency 06/21/2019   Carpal tunnel syndrome 04/10/2019   Acquired trigger finger 04/03/2019   Elevated TSH 05/03/2017   Age-related osteoporosis without current pathological fracture 03/24/2016   Chronic dermatitis 07/09/2015   Allergic rhinitis, seasonal 12/12/2014   Gastro-esophageal reflux disease without esophagitis 12/12/2014   Mixed hyperlipidemia 12/12/2014   Degenerative arthritis of hip 12/12/2014   Chronic obstructive pulmonary disease, unspecified (HCC) 10/11/2014   Bilateral hearing loss 07/15/2014    Allergies  Allergen Reactions   Augmentin  [Amoxicillin -Pot Clavulanate] Itching    Itching all over.   Codeine Rash    Past Surgical History:  Procedure Laterality Date   CATARACT EXTRACTION W/PHACO Left 02/02/2018   Procedure: CATARACT EXTRACTION PHACO AND INTRAOCULAR LENS PLACEMENT (  IOC) LEFT  MALYUGIN;  Surgeon: Mittie Gaskin, MD;  Location: [facility];  Service: Ophthalmology;  Laterality: Left;   CATARACT EXTRACTION W/PHACO Right 05/03/2018   Procedure: CATARACT EXTRACTION PHACO AND INTRAOCULAR LENS PLACEMENT (IOC) RIGHT  IVA/TOPICAL;  Surgeon: Mittie Gaskin, MD;  Location: [facility];  Service: Ophthalmology;  Laterality: Right;   MINOR HEMORRHOIDECTOMY      Social History   Tobacco Use    Smoking status: Former    Current packs/day: 0.00    Average packs/day: 0.5 packs/day for 30.0 years (15.0 ttl pk-yrs)    Types: Cigarettes    Start date: 09/07/1970    Quit date: 09/07/2000    Years since quitting: 23.4   Smokeless tobacco: Never   Tobacco comments:    smoking cessation materials not required  Vaping Use   Vaping status: Never Used  Substance Use Topics   Alcohol use: Yes    Alcohol/week: 1.0 standard drink of alcohol    Types: 1 Glasses of wine per week    Comment: social   Drug use: No     Medication list has been reviewed and updated.  Current Meds  Medication Sig   acetaminophen  (TYLENOL ) 650 MG CR tablet Take 650 mg by mouth every 8 (eight) hours as needed for pain.   albuterol  (PROVENTIL ) (2.5 MG/3ML) 0.083% nebulizer solution Take 3 mLs by nebulization every 6 (six) hours as needed for wheezing or shortness of breath.   albuterol  (VENTOLIN  HFA) 108 (90 Base) MCG/ACT inhaler INHALE 2 PUFFS BY MOUTH EVERY 6 HOURS AS NEEDED FOR WHEEZING OR  SHORTNESS  OF  BREATH   aspirin  81 MG chewable tablet Chew 1 tablet by mouth daily.   benzonatate  (TESSALON ) 100 MG capsule Take 1 capsule (100 mg total) by mouth 3 (three) times daily for 10 days.   blood glucose meter kit and supplies KIT Dispense based on patient and insurance preference. Use up to four times daily as directed.   Calcium Carbonate-Vit D-Min (CALTRATE 600+D PLUS MINERALS) 600-800 MG-UNIT CHEW Chew 1 tablet by mouth daily.   Cholecalciferol (VITAMIN D ) 125 MCG (5000 UT) CAPS Take 1 capsule by mouth daily.   docusate sodium (COLACE) 100 MG capsule Take 1 capsule by mouth daily.   doxycycline  (VIBRA -TABS) 100 MG tablet Take 1 tablet (100 mg total) by mouth 2 (two) times daily for 10 days.   fluticasone  (FLONASE ) 50 MCG/ACT nasal spray Use 2 spray(s) in each nostril once daily   gabapentin  (NEURONTIN ) 100 MG capsule Take 1 capsule (100 mg total) by mouth at bedtime.   TRELEGY ELLIPTA  100-62.5-25 MCG/ACT AEPB  INHALE 1 PUFF INTO THE LUNGS ONCE DAILY   triamterene -hydrochlorothiazide (MAXZIDE-25) 37.5-25 MG tablet Take 1 tablet by mouth daily.   zinc gluconate 50 MG tablet Take 50 mg by mouth daily.       02/28/2024    9:03 AM 11/26/2023    9:49 AM 09/22/2023   10:20 AM 07/08/2023   10:59 AM  GAD 7 : Generalized Anxiety Score  Nervous, Anxious, on Edge 2 3 3 1   Control/stop worrying 2 3 3 1   Worry too much - different things 2 3 3 1   Trouble relaxing 2 3 3  0  Restless 2 3 3 1   Easily annoyed or irritable 2 3 2  0  Afraid - awful might happen 2 3 0 1  Total GAD 7 Score 14 21 17 5   Anxiety Difficulty Somewhat difficult  Somewhat difficult Not difficult at all  02/28/2024    9:02 AM 11/26/2023    9:49 AM 09/22/2023   10:19 AM  Depression screen PHQ 2/9  Decreased Interest 2 3 3   Down, Depressed, Hopeless 2 3 3   PHQ - 2 Score 4 6 6   Altered sleeping 2 2 1   Tired, decreased energy 2 3 3   Change in appetite 2 3 3   Feeling bad or failure about yourself  2 3 0  Trouble concentrating 0 0 3  Moving slowly or fidgety/restless 2 3 3   Suicidal thoughts 0 0 0  PHQ-9 Score 14 20 19   Difficult doing work/chores Somewhat difficult  Somewhat difficult    BP Readings from Last 3 Encounters:  02/28/24 124/68  11/26/23 124/68  09/22/23 124/76    Physical Exam Constitutional:      Appearance: She is well-developed.  HENT:     Right Ear: Ear canal and external ear normal. Tympanic membrane is not erythematous or retracted.     Left Ear: Ear canal and external ear normal. Tympanic membrane is not erythematous or retracted.     Nose:     Right Sinus: Maxillary sinus tenderness and frontal sinus tenderness present.     Left Sinus: Maxillary sinus tenderness and frontal sinus tenderness present.     Mouth/Throat:     Mouth: No oral lesions.     Pharynx: Uvula midline. Posterior oropharyngeal erythema present. No oropharyngeal exudate.   Cardiovascular:     Rate and Rhythm: Normal rate and  regular rhythm.     Heart sounds: Normal heart sounds.  Pulmonary:     Breath sounds: Normal breath sounds. No wheezing or rales.   Musculoskeletal:     Right lower leg: No edema.     Left lower leg: No edema.  Lymphadenopathy:     Cervical: No cervical adenopathy.   Neurological:     Mental Status: She is alert and oriented to person, place, and time.     Wt Readings from Last 3 Encounters:  02/28/24 155 lb (70.3 kg)  11/26/23 160 lb 8 oz (72.8 kg)  09/22/23 163 lb 3.2 oz (74 kg)    BP 124/68   Pulse 91   Temp 98.1 F (36.7 C)   Ht 5' 2 (1.575 m)   Wt 155 lb (70.3 kg)   SpO2 94%   BMI 28.35 kg/m   Assessment and Plan:  Problem List Items Addressed This Visit   None Visit Diagnoses       Acute non-recurrent maxillary sinusitis    -  Primary   Relevant Medications   benzonatate  (TESSALON ) 100 MG capsule   doxycycline  (VIBRA -TABS) 100 MG tablet     Acute cough       Relevant Medications   doxycycline  (VIBRA -TABS) 100 MG tablet       No follow-ups on file.    Leita HILARIO Adie, MD [facility] [place]

## 2024-02-28 NOTE — Patient Instructions (Addendum)
 Continue to take Sudafed for congestion and the other medications.  Get Allegra, or Claritin or Zyrtec for the post nasal drainage.

## 2024-04-24 DIAGNOSIS — Z85828 Personal history of other malignant neoplasm of skin: Secondary | ICD-10-CM | POA: Diagnosis not present

## 2024-04-24 DIAGNOSIS — Z872 Personal history of diseases of the skin and subcutaneous tissue: Secondary | ICD-10-CM | POA: Diagnosis not present

## 2024-04-24 DIAGNOSIS — L720 Epidermal cyst: Secondary | ICD-10-CM | POA: Diagnosis not present

## 2024-04-24 DIAGNOSIS — L578 Other skin changes due to chronic exposure to nonionizing radiation: Secondary | ICD-10-CM | POA: Diagnosis not present

## 2024-04-24 DIAGNOSIS — M25551 Pain in right hip: Secondary | ICD-10-CM | POA: Diagnosis not present

## 2024-04-24 DIAGNOSIS — G8929 Other chronic pain: Secondary | ICD-10-CM | POA: Diagnosis not present

## 2024-04-24 DIAGNOSIS — L821 Other seborrheic keratosis: Secondary | ICD-10-CM | POA: Diagnosis not present

## 2024-05-11 ENCOUNTER — Ambulatory Visit: Admitting: Emergency Medicine

## 2024-05-11 VITALS — Ht 62.0 in | Wt 153.0 lb

## 2024-05-11 DIAGNOSIS — Z1231 Encounter for screening mammogram for malignant neoplasm of breast: Secondary | ICD-10-CM | POA: Diagnosis not present

## 2024-05-11 DIAGNOSIS — Z0000 Encounter for general adult medical examination without abnormal findings: Secondary | ICD-10-CM | POA: Diagnosis not present

## 2024-05-11 NOTE — Progress Notes (Signed)
 Subjective:   Monica Campbell is a 83 y.o. who presents for a Medicare Wellness preventive visit.  As a reminder, Annual Wellness Visits don't include a physical exam, and some assessments may be limited, especially if this visit is performed virtually. We may recommend an in-person follow-up visit with your provider if needed.  Visit Complete: Virtual I connected with  Monica Campbell on 05/11/24 by a audio enabled telemedicine application and verified that I am speaking with the correct person using two identifiers.  Patient Location: Home  Provider Location: Home Office  I discussed the limitations of evaluation and management by telemedicine. The patient expressed understanding and agreed to proceed.  Vital Signs: Because this visit was a virtual/telehealth visit, some criteria may be missing or patient reported. Any vitals not documented were not able to be obtained and vitals that have been documented are patient reported.  VideoDeclined- This patient declined Librarian, academic. Therefore the visit was completed with audio only.  Persons Participating in Visit: Patient.  AWV Questionnaire: No: Patient Medicare AWV questionnaire was not completed prior to this visit.  Cardiac Risk Factors include: advanced age (>74men, >85 women);dyslipidemia     Objective:    Today's Vitals   05/11/24 1553  Weight: 153 lb (69.4 kg)  Height: 5' 2 (1.575 m)   Body mass index is 27.98 kg/m.     05/11/2024    4:15 PM 05/04/2023    3:35 PM 04/08/2023    6:57 AM 04/23/2021    3:34 PM 04/03/2020   11:45 AM 04/03/2019   10:43 AM 05/03/2018    7:45 AM  Advanced Directives  Does Patient Have a Medical Advance Directive? Yes No No Yes No Yes No   Type of Estate agent of [place];Living will   Healthcare Power of [place];Living will  Healthcare Power of [place];Living will   Does patient want to make changes to medical advance directive? No -  Patient declined     No - Patient declined    Copy of Healthcare Power of Attorney in Chart? No - copy requested   No - copy requested  No - copy requested    Would patient like information on creating a medical advance directive?  No - Patient declined No - Patient declined  No - Patient declined  No - Patient declined      Data saved with a previous flowsheet row definition    Current Medications (verified) Outpatient Encounter Medications as of 05/11/2024  Medication Sig   acetaminophen  (TYLENOL ) 650 MG CR tablet Take 650 mg by mouth every 8 (eight) hours as needed for pain.   albuterol  (PROVENTIL ) (2.5 MG/3ML) 0.083% nebulizer solution Take 3 mLs by nebulization every 6 (six) hours as needed for wheezing or shortness of breath.   albuterol  (VENTOLIN  HFA) 108 (90 Base) MCG/ACT inhaler INHALE 2 PUFFS BY MOUTH EVERY 6 HOURS AS NEEDED FOR WHEEZING OR  SHORTNESS  OF  BREATH   aspirin  81 MG chewable tablet Chew 1 tablet by mouth daily.   blood glucose meter kit and supplies KIT Dispense based on patient and insurance preference. Use up to four times daily as directed.   Calcium Carbonate-Vit D-Min (CALTRATE 600+D PLUS MINERALS) 600-800 MG-UNIT CHEW Chew 1 tablet by mouth daily.   Cholecalciferol (VITAMIN D ) 125 MCG (5000 UT) CAPS Take 1 capsule by mouth daily.   docusate sodium (COLACE) 100 MG capsule Take 1 capsule by mouth daily.   fluticasone  (FLONASE ) 50 MCG/ACT  nasal spray Use 2 spray(s) in each nostril once daily   lidocaine  (LIDODERM ) 5 % Place 1 patch onto the skin daily. Remove & Discard patch within 12 hours or as directed by MD (Patient taking differently: Place 1 patch onto the skin daily. Remove & Discard patch within 12 hours or as directed by MD PRN)   TRELEGY ELLIPTA  100-62.5-25 MCG/ACT AEPB INHALE 1 PUFF INTO THE LUNGS ONCE DAILY   gabapentin  (NEURONTIN ) 100 MG capsule Take 1 capsule (100 mg total) by mouth at bedtime. (Patient not taking: Reported on 05/13/24)    triamterene -hydrochlorothiazide (MAXZIDE-25) 37.5-25 MG tablet Take 1 tablet by mouth daily. (Patient not taking: Reported on 05-13-24)   zinc gluconate 50 MG tablet Take 50 mg by mouth daily. (Patient not taking: Reported on 2024-05-13)   No facility-administered encounter medications on file as of 05-13-2024.    Allergies (verified) Augmentin  [amoxicillin -pot clavulanate] and Codeine   History: Past Medical History:  Diagnosis Date   COPD (chronic obstructive pulmonary disease) (HCC)    Dyspnea    GERD (gastroesophageal reflux disease)    Hard of hearing    Hypercholesteremia    Osteoarthritis    Seasonal allergies    Past Surgical History:  Procedure Laterality Date   CATARACT EXTRACTION W/PHACO Left 02/02/2018   Procedure: CATARACT EXTRACTION PHACO AND INTRAOCULAR LENS PLACEMENT (IOC) LEFT  MALYUGIN;  Surgeon: Mittie Gaskin, MD;  Location: [facility];  Service: Ophthalmology;  Laterality: Left;   CATARACT EXTRACTION W/PHACO Right 05/03/2018   Procedure: CATARACT EXTRACTION PHACO AND INTRAOCULAR LENS PLACEMENT (IOC) RIGHT  IVA/TOPICAL;  Surgeon: Mittie Gaskin, MD;  Location: [facility];  Service: Ophthalmology;  Laterality: Right;   MINOR HEMORRHOIDECTOMY     Family History  Problem Relation Age of Onset   Heart disease Mother    Heart disease Sister    Heart disease Brother    Cancer Father        colon   Breast cancer Neg Hx    Social History   Socioeconomic History   Marital status: Divorced    Spouse name: Not on file   Number of children: 3   Years of education: Not on file   Highest education level: 12th grade  Occupational History   Occupation: Retired  Tobacco Use   Smoking status: Former    Current packs/day: 0.00    Average packs/day: 0.5 packs/day for 30.0 years (15.0 ttl pk-yrs)    Types: Cigarettes    Start date: 09/07/1970    Quit date: 09/07/2000    Years since quitting: 23.6   Smokeless tobacco: Never   Tobacco comments:     smoking cessation materials not required  Vaping Use   Vaping status: Never Used  Substance and Sexual Activity   Alcohol use: Yes    Alcohol/week: 1.0 standard drink of alcohol    Types: 1 Glasses of wine per week    Comment: social, 1 glass of wine every 3 or 4 mths   Drug use: No   Sexual activity: Not Currently    Birth control/protection: None  Other Topics Concern   Not on file  Social History Narrative   Pt lives alone    05-13-2024 2 deceased sons, 1 living daughter/pbt   Social Drivers of Health   Financial Resource Strain: Low Risk  (05-13-24)   Overall Financial Resource Strain (CARDIA)    Difficulty of Paying Living Expenses: Not hard at all  Food Insecurity: No Food Insecurity (2024/05/13)   Hunger Vital  Sign    Worried About Programme researcher, broadcasting/film/video in the Last Year: Never true    Ran Out of Food in the Last Year: Never true  Transportation Needs: No Transportation Needs (05/11/2024)   PRAPARE - Administrator, Civil Service (Medical): No    Lack of Transportation (Non-Medical): No  Physical Activity: Insufficiently Active (05/11/2024)   Exercise Vital Sign    Days of Exercise per Week: 3 days    Minutes of Exercise per Session: 30 min  Stress: No Stress Concern Present (05/11/2024)   [employer] of Occupational Health - Occupational Stress Questionnaire    Feeling of Stress: Not at all  Social Connections: Socially Isolated (05/11/2024)   Social Connection and Isolation Panel    Frequency of Communication with Friends and Family: More than three times a week    Frequency of Social Gatherings with Friends and Family: Three times a week    Attends Religious Services: Never    Active Member of Clubs or Organizations: No    Attends Banker Meetings: Never    Marital Status: Divorced    Tobacco Counseling Counseling given: Not Answered Tobacco comments: smoking cessation materials not required    Clinical Intake:  Pre-visit preparation  completed: Yes  Pain : No/denies pain     BMI - recorded: 27.98 Nutritional Status: BMI 25 -29 Overweight Nutritional Risks: None Diabetes: No  No results found for: HGBA1C   How often do you need to have someone help you when you read instructions, pamphlets, or other written materials from your doctor or pharmacy?: 1 - Never  Interpreter Needed?: No  Information entered by :: Vina Ned, CMA   Activities of Daily Living     05/11/2024    3:56 PM  In your present state of health, do you have any difficulty performing the following activities:  Hearing? 1  Comment wears hearing aids  Vision? 0  Difficulty concentrating or making decisions? 0  Walking or climbing stairs? 0  Dressing or bathing? 0  Doing errands, shopping? 0  Preparing Food and eating ? N  Using the Toilet? N  In the past six months, have you accidently leaked urine? Y  Comment wears mini pad  Do you have problems with loss of bowel control? N  Managing your Medications? N  Managing your Finances? N  Housekeeping or managing your Housekeeping? N    Patient Care Team: Justus Leita DEL, MD as PCP - General (Internal Medicine) Mittie Gaskin, MD as Referring Physician (Ophthalmology) Dodson Delon FERNS, MD as Referring Physician (Physical Medicine and Rehabilitation) Tobie Lady POUR, MD as Consulting Physician (Rheumatology) Cathlyn Seal, MD as Referring Physician (Dermatology) Ammon Blunt, MD as Consulting Physician (Cardiology)  I have updated your Care Teams any recent Medical Services you may have received from other providers in the past year.     Assessment:   This is a routine wellness examination for Monica Campbell.  Hearing/Vision screen Hearing Screening - Comments:: Wears hearing aids Vision Screening - Comments:: Gets routine eye exams, Dr. Mittie, [place]   Goals Addressed             This Visit's Progress    Patient Stated       Maintain current  health and activity level     COMPLETED: Weight < 200 lb (90.7 kg)   153 lb (69.4 kg)    Patient has lost 10-15 pounds being very active and would like to continue being active in  her life as well as keep eating healthy.        Depression Screen     05/11/2024    4:11 PM 02/28/2024    9:02 AM 11/26/2023    9:49 AM 09/22/2023   10:19 AM 07/08/2023   10:59 AM 05/04/2023    3:33 PM 04/19/2023    1:55 PM  PHQ 2/9 Scores  PHQ - 2 Score 1 4 6 6  0 2 6  PHQ- 9 Score 1 14 20 19 3 3 11     Fall Risk     05/11/2024    4:16 PM 02/28/2024    9:02 AM 11/26/2023    9:49 AM 09/22/2023   10:19 AM 07/08/2023   10:59 AM  Fall Risk   Falls in the past year? 1 1 1  0 0  Number falls in past yr: 0 0 0 0 0  Injury with Fall? 0 0 1 0 0  Risk for fall due to : No Fall Risks;Impaired balance/gait;History of fall(s) History of fall(s) History of fall(s) No Fall Risks No Fall Risks  Follow up Falls evaluation completed;Education provided Falls evaluation completed Falls evaluation completed Falls evaluation completed Falls evaluation completed    MEDICARE RISK AT HOME:  Medicare Risk at Home Any stairs in or around the home?: Yes If so, are there any without handrails?: No Home free of loose throw rugs in walkways, pet beds, electrical cords, etc?: Yes Adequate lighting in your home to reduce risk of falls?: Yes Life alert?: No Use of a cane, walker or w/c?: No Grab bars in the bathroom?: Yes Shower chair or bench in shower?: No Elevated toilet seat or a handicapped toilet?: No  TIMED UP AND GO:  Was the test performed?  No  Cognitive Function: 6CIT completed        05/11/2024    4:19 PM 05/04/2023    3:39 PM 05/01/2022    3:04 PM 04/03/2020   11:50 AM 04/03/2019   10:47 AM  6CIT Screen  What Year? 0 points 0 points 0 points 0 points 0 points  What month? 0 points 0 points 0 points 0 points 0 points  What time? 3 points 0 points 0 points 0 points 0 points  Count back from 20 0 points 0 points 0  points 0 points 0 points  Months in reverse 4 points 0 points 0 points 0 points 0 points  Repeat phrase 0 points 0 points 0 points 0 points 0 points  Total Score 7 points 0 points 0 points 0 points 0 points    Immunizations Immunization History  Administered Date(s) Administered   Fluad Quad(high Dose 65+) 06/04/2020, 06/06/2021, 06/08/2022   Fluad Trivalent(High Dose 65+) 07/08/2023   INFLUENZA, HIGH DOSE SEASONAL PF 06/09/2017, 05/12/2018, 05/10/2019   Influenza-Unspecified 05/18/2012, 07/24/2015   PFIZER Comirnaty(Gray Top)Covid-19 Tri-Sucrose Vaccine 03/28/2021   PFIZER(Purple Top)SARS-COV-2 Vaccination 11/06/2019, 11/26/2019, 07/03/2020   PNEUMOCOCCAL CONJUGATE-20 06/08/2022   Pfizer(Comirnaty)Fall Seasonal Vaccine 12 years and older 07/08/2023   Pneumococcal Conjugate-13 01/17/2014   Pneumococcal Polysaccharide-23 02/06/2004, 02/20/2007, 12/16/2011   Tdap 05/11/2014   Zoster, Live 02/04/2010    Screening Tests Health Maintenance  Topic Date Due   Diabetic kidney evaluation - Urine ACR  Never done   Zoster Vaccines- Shingrix (1 of 2) 05/30/1960   INFLUENZA VACCINE  04/07/2024   COVID-19 Vaccine (6 - Pfizer risk 2024-25 season) 05/08/2024   DTaP/Tdap/Td (2 - Td or Tdap) 05/11/2024   Diabetic kidney evaluation - eGFR measurement  07/07/2024  MAMMOGRAM  07/14/2024   Medicare Annual Wellness (AWV)  05/11/2025   DEXA SCAN  11/16/2025   Pneumococcal Vaccine: 50+ Years  Completed   HPV VACCINES  Aged Out   Meningococcal B Vaccine  Aged Out   Hepatitis C Screening  Discontinued    Health Maintenance  Health Maintenance Due  Topic Date Due   Diabetic kidney evaluation - Urine ACR  Never done   Zoster Vaccines- Shingrix (1 of 2) 05/30/1960   INFLUENZA VACCINE  04/07/2024   COVID-19 Vaccine (6 - Pfizer risk 2024-25 season) 05/08/2024   DTaP/Tdap/Td (2 - Td or Tdap) 05/11/2024   Health Maintenance Items Addressed: Mammogram ordered, See Nurse Notes at the end of this  note  Additional Screening:  Vision Screening: Recommended annual ophthalmology exams for early detection of glaucoma and other disorders of the eye. Would you like a referral to an eye doctor? No    Dental Screening: Recommended annual dental exams for proper oral hygiene  Community Resource Referral / Chronic Care Management: CRR required this visit?  No   CCM required this visit?  No   Plan:    I have personally reviewed and noted the following in the patient's chart:   Medical and social history Use of alcohol, tobacco or illicit drugs  Current medications and supplements including opioid prescriptions. Patient is not currently taking opioid prescriptions. Functional ability and status Nutritional status Physical activity Advanced directives List of other physicians Hospitalizations, surgeries, and ER visits in previous 12 months Vitals Screenings to include cognitive, depression, and falls Referrals and appointments  In addition, I have reviewed and discussed with patient certain preventive protocols, quality metrics, and best practice recommendations. A written personalized care plan for preventive services as well as general preventive health recommendations were provided to patient.   Vina Ned, CMA   05/11/2024   After Visit Summary: (Mail) Due to this being a telephonic visit, the after visit summary with patients personalized plan was offered to patient via mail   Notes:  6 CIT Score - 7 Needs flu vaccine Wants to discuss with PCP the need for Tdap and Shingles vaccines Declined Covid vaccine Place order for a MMG (due ~07/14/24) Screening colonoscopy no longer recommended due to age.

## 2024-05-11 NOTE — Patient Instructions (Signed)
 Monica Campbell , Thank you for taking time out of your busy schedule to complete your Annual Wellness Visit with me. I enjoyed our conversation and look forward to speaking with you again next year. I, as well as your care team,  appreciate your ongoing commitment to your health goals. Please review the following plan we discussed and let me know if I can assist you in the future. Your Game plan/ To Do List    Referrals: None   Follow up Visits: Your Next Medicare AWV will be due after 05/12/25. Have you seen your provider in the last 6 months (3 months if uncontrolled diabetes)? Yes  Clinician Recommendations: Get a flu vaccine at your convenience. Discuss with Dr. Justus the need for a Shingles and Tetanus vaccine at your next OV.  Aim for 30 minutes of exercise or brisk walking, 6-8 glasses of water, and 5 servings of fruits and vegetables each day.   Please call to schedule your mammogram due 07/15/24:  [facility] at [facility] Address: [address], [place], [place] Phone: [phone]  [facility] at [facility] [address] [place], [place] [address] Phone: [phone]       This is a list of the screenings recommended for you:  Health Maintenance  Topic Date Due   Yearly kidney health urinalysis for diabetes  Never done   Zoster (Shingles) Vaccine (1 of 2) 05/30/1960   Flu Shot  04/07/2024   COVID-19 Vaccine (6 - Pfizer risk 2024-25 season) 05/08/2024   DTaP/Tdap/Td vaccine (2 - Td or Tdap) 05/11/2024   Yearly kidney function blood test for diabetes  07/07/2024   Mammogram  07/14/2024   Medicare Annual Wellness Visit  05/11/2025   DEXA scan (bone density measurement)  11/16/2025   Pneumococcal Vaccine for age over 75  Completed   HPV Vaccine  Aged Out   Meningitis B Vaccine  Aged Out   Hepatitis C Screening  Discontinued    Advanced directives: (Copy Requested) Please bring a copy of your health care power of  attorney and living will to the office to be added to your chart at your convenience. You can mail to [facility] [address] [place], [place] [address] or email to [email or web address] Advance Care Planning is important because it:  [x]  Makes sure you receive the medical care that is consistent with your values, goals, and preferences  [x]  It provides guidance to your family and loved ones and reduces their decisional burden about whether or not they are making the right decisions based on your wishes.  Follow the link provided in your after visit summary or read over the paperwork we have mailed to you to help you started getting your Advance Directives in place. If you need assistance in completing these, please reach out to us  so that we can help you!  See attachments for Preventive Care and Fall Prevention Tips.   Fall Prevention in the Home, Adult Falls can cause injuries and affect people of all ages. There are many simple things that you can do to make your home safe and to help prevent falls. If you need it, ask for help making these changes. What actions can I take to prevent falls? General information Use good lighting in all rooms. Make sure to: Replace any light bulbs that burn out. Turn on lights if it is dark and use night-lights. Keep items that you use often in easy-to-reach places. Lower  the shelves around your home if needed. Move furniture so that there are clear paths around it. Do not keep throw rugs or other things on the floor that can make you trip. If any of your floors are uneven, fix them. Add color or contrast paint or tape to clearly mark and help you see: Grab bars or handrails. First and last steps of staircases. Where the edge of each step is. If you use a ladder or stepladder: Make sure that it is fully opened. Do not climb a closed ladder. Make sure the sides of the ladder are locked in place. Have someone hold the ladder  while you use it. Know where your pets are as you move through your home. What can I do in the bathroom?     Keep the floor dry. Clean up any water that is on the floor right away. Remove soap buildup in the bathtub or shower. Buildup makes bathtubs and showers slippery. Use non-skid mats or decals on the floor of the bathtub or shower. Attach bath mats securely with double-sided, non-slip rug tape. If you need to sit down while you are in the shower, use a non-slip stool. Install grab bars by the toilet and in the bathtub and shower. Do not use towel bars as grab bars. What can I do in the bedroom? Make sure that you have a light by your bed that is easy to reach. Do not use any sheets or blankets on your bed that hang to the floor. Have a firm bench or chair with side arms that you can use for support when you get dressed. What can I do in the kitchen? Clean up any spills right away. If you need to reach something above you, use a sturdy step stool that has a grab bar. Keep electrical cables out of the way. Do not use floor polish or wax that makes floors slippery. What can I do with my stairs? Do not leave anything on the stairs. Make sure that you have a light switch at the top and the bottom of the stairs. Have them installed if you do not have them. Make sure that there are handrails on both sides of the stairs. Fix handrails that are broken or loose. Make sure that handrails are as long as the staircases. Install non-slip stair treads on all stairs in your home if they do not have carpet. Avoid having throw rugs at the top or bottom of stairs, or secure the rugs with carpet tape to prevent them from moving. Choose a carpet design that does not hide the edge of steps on the stairs. Make sure that carpet is firmly attached to the stairs. Fix any carpet that is loose or worn. What can I do on the outside of my home? Use bright outdoor lighting. Repair the edges of walkways and  driveways and fix any cracks. Clear paths of anything that can make you trip, such as tools or rocks. Add color or contrast paint or tape to clearly mark and help you see high doorway thresholds. Trim any bushes or trees on the main path into your home. Check that handrails are securely fastened and in good repair. Both sides of all steps should have handrails. Install guardrails along the edges of any raised decks or porches. Have leaves, snow, and ice cleared regularly. Use sand, salt, or ice melt on walkways during winter months if you live where there is ice and snow. In the garage, clean up any  spills right away, including grease or oil spills. What other actions can I take? Review your medicines with your health care provider. Some medicines can make you confused or feel dizzy. This can increase your chance of falling. Wear closed-toe shoes that fit well and support your feet. Wear shoes that have rubber soles and low heels. Use a cane, walker, scooter, or crutches that help you move around if needed. Talk with your provider about other ways that you can decrease your risk of falls. This may include seeing a physical therapist to learn to do exercises to improve movement and strength. Where to find more information Centers for Disease Control and Prevention, STEADI: TonerPromos.no [employer] on Aging: BaseRingTones.pl National Institute on Aging: BaseRingTones.pl Contact a health care provider if: You are afraid of falling at home. You feel weak, drowsy, or dizzy at home. You fall at home. Get help right away if you: Lose consciousness or have trouble moving after a fall. Have a fall that causes a head injury. These symptoms may be an emergency. Get help right away. Call 911. Do not wait to see if the symptoms will go away. Do not drive yourself to the hospital. This information is not intended to replace advice given to you by your health care provider. Make sure you discuss any questions you  have with your health care provider. Document Revised: 04/27/2022 Document Reviewed: 04/27/2022 Elsevier Patient Education  2024 [employer].

## 2024-06-05 DIAGNOSIS — M5416 Radiculopathy, lumbar region: Secondary | ICD-10-CM | POA: Diagnosis not present

## 2024-06-26 ENCOUNTER — Ambulatory Visit (INDEPENDENT_AMBULATORY_CARE_PROVIDER_SITE_OTHER): Admitting: Internal Medicine

## 2024-06-26 ENCOUNTER — Ambulatory Visit: Payer: Self-pay

## 2024-06-26 ENCOUNTER — Encounter: Payer: Self-pay | Admitting: Internal Medicine

## 2024-06-26 VITALS — BP 136/72 | HR 66 | Ht 62.0 in | Wt 147.0 lb

## 2024-06-26 DIAGNOSIS — H02841 Edema of right upper eyelid: Secondary | ICD-10-CM | POA: Diagnosis not present

## 2024-06-26 DIAGNOSIS — H0289 Other specified disorders of eyelid: Secondary | ICD-10-CM | POA: Diagnosis not present

## 2024-06-26 MED ORDER — PREDNISONE 10 MG PO TABS: ORAL_TABLET | ORAL | 0 refills | Status: AC

## 2024-06-26 NOTE — Progress Notes (Signed)
 Date:  06/26/2024   Name:  Monica Campbell   DOB:  29-Sep-1940   MRN:  [id number]   Chief Complaint: Facial Swelling (X1- day,Possible bug bite, right eye, redness )  Eye Problem  The right eye is affected. This is a new problem. The current episode started yesterday. The problem occurs constantly. Injury mechanism: suspected insect bite. The patient is experiencing no pain. There is No known exposure to pink eye. Associated symptoms include itching (and swelling). Pertinent negatives include no weakness.    Review of Systems  Constitutional:  Negative for fatigue and unexpected weight change.  HENT:  Negative for trouble swallowing.   Eyes:  Positive for itching (and swelling). Negative for visual disturbance.  Respiratory:  Negative for cough, chest tightness, shortness of breath and wheezing.   Cardiovascular:  Negative for chest pain, palpitations and leg swelling.  Gastrointestinal:  Negative for abdominal pain, constipation and diarrhea.  Musculoskeletal:  Negative for arthralgias and myalgias.  Neurological:  Negative for dizziness, weakness, light-headedness and headaches.     Lab Results  Component Value Date   NA 139 07/08/2023   K 4.1 07/08/2023   CO2 26 07/08/2023   GLUCOSE 91 07/08/2023   BUN 13 07/08/2023   CREATININE 0.80 07/08/2023   CALCIUM 9.4 07/08/2023   EGFR 74 07/08/2023   GFRNONAA 71 06/04/2020   Lab Results  Component Value Date   CHOL 239 (H) 07/08/2023   HDL 58 07/08/2023   LDLCALC 149 (H) 07/08/2023   TRIG 177 (H) 07/08/2023   CHOLHDL 4.1 07/08/2023   Lab Results  Component Value Date   TSH 2.730 07/08/2023   No results found for: HGBA1C Lab Results  Component Value Date   WBC 8.2 07/08/2023   HGB 12.9 07/08/2023   HCT 39.2 07/08/2023   MCV 90 07/08/2023   PLT 279 07/08/2023   Lab Results  Component Value Date   ALT 7 07/08/2023   AST 16 07/08/2023   ALKPHOS 69 07/08/2023   BILITOT 0.3 07/08/2023   Lab Results  Component  Value Date   VD25OH 39.6 06/06/2021     Patient Active Problem List   Diagnosis Date Noted   Skin tag of anus 07/08/2023   Lumbar stenosis with neurogenic claudication 06/04/2020   Ankle edema, bilateral 06/04/2020   Senile ecchymosis 06/04/2020   Arthritis of both acromioclavicular joints 09/05/2019   Bursitis of right shoulder 07/05/2019   Venous insufficiency of right leg 06/21/2019   Myalgia 06/21/2019   Vitamin D  deficiency 06/21/2019   Carpal tunnel syndrome 04/10/2019   Acquired trigger finger 04/03/2019   Elevated TSH 05/03/2017   Age-related osteoporosis without current pathological fracture 03/24/2016   Chronic dermatitis 07/09/2015   Allergic rhinitis, seasonal 12/12/2014   Gastro-esophageal reflux disease without esophagitis 12/12/2014   Mixed hyperlipidemia 12/12/2014   Degenerative arthritis of hip 12/12/2014   Chronic obstructive pulmonary disease, unspecified (HCC) 10/11/2014   Bilateral hearing loss 07/15/2014    Allergies  Allergen Reactions   Augmentin  [Amoxicillin -Pot Clavulanate] Itching    Itching all over.   Codeine Rash    Past Surgical History:  Procedure Laterality Date   CATARACT EXTRACTION W/PHACO Left 02/02/2018   Procedure: CATARACT EXTRACTION PHACO AND INTRAOCULAR LENS PLACEMENT (IOC) LEFT  MALYUGIN;  Surgeon: Mittie Gaskin, MD;  Location: [facility];  Service: Ophthalmology;  Laterality: Left;   CATARACT EXTRACTION W/PHACO Right 05/03/2018   Procedure: CATARACT EXTRACTION PHACO AND INTRAOCULAR LENS PLACEMENT (IOC) RIGHT  IVA/TOPICAL;  Surgeon: Mittie,  Dene, MD;  Location: [facility];  Service: Ophthalmology;  Laterality: Right;   MINOR HEMORRHOIDECTOMY      Social History   Tobacco Use   Smoking status: Former    Current packs/day: 0.00    Average packs/day: 0.5 packs/day for 30.0 years (15.0 ttl pk-yrs)    Types: Cigarettes    Start date: 09/07/1970    Quit date: 09/07/2000    Years since quitting: 23.8    Smokeless tobacco: Never   Tobacco comments:    smoking cessation materials not required  Vaping Use   Vaping status: Never Used  Substance Use Topics   Alcohol use: Yes    Alcohol/week: 1.0 standard drink of alcohol    Types: 1 Glasses of wine per week    Comment: social, 1 glass of wine every 3 or 4 mths   Drug use: No     Medication list has been reviewed and updated.  Current Meds  Medication Sig   acetaminophen  (TYLENOL ) 650 MG CR tablet Take 650 mg by mouth every 8 (eight) hours as needed for pain.   albuterol  (PROVENTIL ) (2.5 MG/3ML) 0.083% nebulizer solution Take 3 mLs by nebulization every 6 (six) hours as needed for wheezing or shortness of breath.   albuterol  (VENTOLIN  HFA) 108 (90 Base) MCG/ACT inhaler INHALE 2 PUFFS BY MOUTH EVERY 6 HOURS AS NEEDED FOR WHEEZING OR  SHORTNESS  OF  BREATH   aspirin  81 MG chewable tablet Chew 1 tablet by mouth daily.   blood glucose meter kit and supplies KIT Dispense based on patient and insurance preference. Use up to four times daily as directed.   Calcium Carbonate-Vit D-Min (CALTRATE 600+D PLUS MINERALS) 600-800 MG-UNIT CHEW Chew 1 tablet by mouth daily.   Cholecalciferol (VITAMIN D ) 125 MCG (5000 UT) CAPS Take 1 capsule by mouth daily.   docusate sodium (COLACE) 100 MG capsule Take 1 capsule by mouth daily.   fluticasone  (FLONASE ) 50 MCG/ACT nasal spray Use 2 spray(s) in each nostril once daily   lidocaine  (LIDODERM ) 5 % Place 1 patch onto the skin daily. Remove & Discard patch within 12 hours or as directed by MD   predniSONE  (DELTASONE ) 10 MG tablet Take 6 tablets (60 mg total) by mouth daily with breakfast for 1 day, THEN 5 tablets (50 mg total) daily with breakfast for 1 day, THEN 4 tablets (40 mg total) daily with breakfast for 1 day, THEN 3 tablets (30 mg total) daily with breakfast for 1 day, THEN 2 tablets (20 mg total) daily with breakfast for 1 day, THEN 1 tablet (10 mg total) daily with breakfast for 1 day.   TRELEGY ELLIPTA   100-62.5-25 MCG/ACT AEPB INHALE 1 PUFF INTO THE LUNGS ONCE DAILY   zinc gluconate 50 MG tablet Take 50 mg by mouth daily.   [DISCONTINUED] gabapentin  (NEURONTIN ) 100 MG capsule Take 1 capsule (100 mg total) by mouth at bedtime.       06/26/2024    2:10 PM 02/28/2024    9:03 AM 11/26/2023    9:49 AM 09/22/2023   10:20 AM  GAD 7 : Generalized Anxiety Score  Nervous, Anxious, on Edge 0 2 3 3   Control/stop worrying 0 2 3 3   Worry too much - different things 0 2 3 3   Trouble relaxing 0 2 3 3   Restless 0 2 3 3   Easily annoyed or irritable 0 2 3 2   Afraid - awful might happen 0 2 3 0  Total GAD 7 Score 0 14 21  17  Anxiety Difficulty Not difficult at all Somewhat difficult  Somewhat difficult       06/26/2024    2:09 PM 05/11/2024    4:11 PM 02/28/2024    9:02 AM  Depression screen PHQ 2/9  Decreased Interest 0 0 2  Down, Depressed, Hopeless 0 1 2  PHQ - 2 Score 0 1 4  Altered sleeping 0 0 2  Tired, decreased energy 0 0 2  Change in appetite 0 0 2  Feeling bad or failure about yourself  0 0 2  Trouble concentrating 0 0 0  Moving slowly or fidgety/restless 0 0 2  Suicidal thoughts 0 0 0  PHQ-9 Score 0 1 14  Difficult doing work/chores Not difficult at all Not difficult at all Somewhat difficult    BP Readings from Last 3 Encounters:  06/26/24 136/72  02/28/24 124/68  11/26/23 124/68    Physical Exam Vitals and nursing note reviewed.  Constitutional:      General: She is not in acute distress.    Appearance: Normal appearance. She is well-developed.  HENT:     Head: Normocephalic and atraumatic.  Eyes:     Extraocular Movements: Extraocular movements intact.     Conjunctiva/sclera: Conjunctivae normal.     Pupils: Pupils are equal, round, and reactive to light.     Comments: Swelling right upper and lower lids No discharge  Cardiovascular:     Rate and Rhythm: Normal rate and regular rhythm.  Pulmonary:     Effort: Pulmonary effort is normal. No respiratory distress.      Breath sounds: No wheezing or rhonchi.  Musculoskeletal:     Cervical back: Normal range of motion.  Skin:    General: Skin is warm and dry.     Findings: No rash.  Neurological:     Mental Status: She is alert and oriented to person, place, and time.  Psychiatric:        Mood and Affect: Mood normal.        Behavior: Behavior normal.     Wt Readings from Last 3 Encounters:  06/26/24 147 lb (66.7 kg)  05/11/24 153 lb (69.4 kg)  02/28/24 155 lb (70.3 kg)    BP 136/72   Pulse 66   Ht 5' 2 (1.575 m)   Wt 147 lb (66.7 kg)   SpO2 95%   BMI 26.89 kg/m   Assessment and Plan:  Problem List Items Addressed This Visit   None Visit Diagnoses       Pain and swelling of upper eyelid of right eye    -  Primary   continue cold compresses as needed will treat with steroid taper - no evidence of infection or conjunctivitis   Relevant Medications   predniSONE  (DELTASONE ) 10 MG tablet       No follow-ups on file.    Leita HILARIO Adie, MD [facility] [place]

## 2024-06-26 NOTE — Telephone Encounter (Signed)
 FYI Only or Action Required?: FYI only for provider.  Patient was last seen in primary care on 02/28/2024 by Justus Leita DEL, MD.  Called Nurse Triage reporting Insect Bite and Facial Swelling.  Symptoms began yesterday.  Interventions attempted: Ice/heat application.  Symptoms are: unchanged.  Triage Disposition: See Physician Within 24 Hours  Patient/caregiver understands and will follow disposition?: Yes  Copied from CRM #[id number]. Topic: Clinical - Red Word Triage >> Jun 26, 2024 10:36 AM Avram MATSU wrote: Red Word that prompted transfer to Nurse Triage: got bit on the right corner of her eye. Face started to swell and feels sore under her eye. Reason for Disposition  Face swelling is painful to touch  Answer Assessment - Initial Assessment Questions Scheduled appt 06/26/24.  Advised call back/ ED if worsens.  1. ONSET: When did the swelling start? (e.g., minutes, hours, days)     yesterday 2. LOCATION: What part of the face is swollen? (e.g., cheek, entire face, jaw joint area, under jaw)    right Around eyebrow and under cheek 3. SEVERITY: How swollen is it?     Has not affected site; did not answer. Redness, used ice pack 4. ITCHING: Is there any itching? If Yes, ask: How much?   (Scale 1-10; mild, moderate or severe)     yes 5. PAIN: Is the swelling painful to touch? If Yes, ask: How painful is it?   (Scale 0-10; mild, moderate or severe)     mild 6. FEVER: Do you have a fever? If Yes, ask: What is it, how was it measured, and when did it start?      Denies fever chills, n/v 7. CAUSE: What do you think is causing the face swelling?     mosquito  9. RECURRENT SYMPTOM: Have you had face swelling before? If Yes, ask: When was the last time? What happened that time?     Yes, when bit by mosquito 10. OTHER SYMPTOMS: Do you have any other symptoms? (e.g., leg swelling, toothache)       Denies vision changes, HA, dizziness, difficulty  breathing.  Protocols used: Face Swelling-A-AH

## 2024-06-26 NOTE — Telephone Encounter (Signed)
 Noted  Pt has appt.  KP

## 2024-06-29 DIAGNOSIS — H43813 Vitreous degeneration, bilateral: Secondary | ICD-10-CM | POA: Diagnosis not present

## 2024-06-29 DIAGNOSIS — Z961 Presence of intraocular lens: Secondary | ICD-10-CM | POA: Diagnosis not present

## 2024-06-29 DIAGNOSIS — Z0100 Encounter for examination of eyes and vision without abnormal findings: Secondary | ICD-10-CM | POA: Diagnosis not present

## 2024-06-29 DIAGNOSIS — H04123 Dry eye syndrome of bilateral lacrimal glands: Secondary | ICD-10-CM | POA: Diagnosis not present

## 2024-06-29 DIAGNOSIS — H02833 Dermatochalasis of right eye, unspecified eyelid: Secondary | ICD-10-CM | POA: Diagnosis not present

## 2024-06-30 DIAGNOSIS — M5441 Lumbago with sciatica, right side: Secondary | ICD-10-CM | POA: Diagnosis not present

## 2024-06-30 DIAGNOSIS — M25551 Pain in right hip: Secondary | ICD-10-CM | POA: Diagnosis not present

## 2024-06-30 DIAGNOSIS — G8929 Other chronic pain: Secondary | ICD-10-CM | POA: Diagnosis not present

## 2024-06-30 DIAGNOSIS — M5416 Radiculopathy, lumbar region: Secondary | ICD-10-CM | POA: Diagnosis not present

## 2024-06-30 DIAGNOSIS — M5442 Lumbago with sciatica, left side: Secondary | ICD-10-CM | POA: Diagnosis not present

## 2024-07-10 ENCOUNTER — Encounter: Payer: Self-pay | Admitting: Internal Medicine

## 2024-07-10 ENCOUNTER — Ambulatory Visit (INDEPENDENT_AMBULATORY_CARE_PROVIDER_SITE_OTHER): Payer: Self-pay | Admitting: Internal Medicine

## 2024-07-10 VITALS — BP 118/62 | HR 73 | Ht 62.0 in | Wt 157.0 lb

## 2024-07-10 DIAGNOSIS — Z0000 Encounter for general adult medical examination without abnormal findings: Secondary | ICD-10-CM | POA: Diagnosis not present

## 2024-07-10 DIAGNOSIS — K219 Gastro-esophageal reflux disease without esophagitis: Secondary | ICD-10-CM

## 2024-07-10 DIAGNOSIS — J439 Emphysema, unspecified: Secondary | ICD-10-CM

## 2024-07-10 DIAGNOSIS — Z1231 Encounter for screening mammogram for malignant neoplasm of breast: Secondary | ICD-10-CM | POA: Diagnosis not present

## 2024-07-10 DIAGNOSIS — R7989 Other specified abnormal findings of blood chemistry: Secondary | ICD-10-CM | POA: Diagnosis not present

## 2024-07-10 DIAGNOSIS — M810 Age-related osteoporosis without current pathological fracture: Secondary | ICD-10-CM

## 2024-07-10 DIAGNOSIS — N3281 Overactive bladder: Secondary | ICD-10-CM | POA: Insufficient documentation

## 2024-07-10 DIAGNOSIS — Z23 Encounter for immunization: Secondary | ICD-10-CM | POA: Diagnosis not present

## 2024-07-10 DIAGNOSIS — E782 Mixed hyperlipidemia: Secondary | ICD-10-CM

## 2024-07-10 MED ORDER — FESOTERODINE FUMARATE ER 4 MG PO TB24
4.0000 mg | ORAL_TABLET | Freq: Every day | ORAL | 0 refills | Status: AC
Start: 2024-07-10 — End: ?

## 2024-07-10 MED ORDER — ALBUTEROL SULFATE HFA 108 (90 BASE) MCG/ACT IN AERS
2.0000 | INHALATION_SPRAY | Freq: Four times a day (QID) | RESPIRATORY_TRACT | 1 refills | Status: AC | PRN
Start: 2024-07-10 — End: ?

## 2024-07-10 MED ORDER — TRELEGY ELLIPTA 100-62.5-25 MCG/ACT IN AEPB: 1.0000 | INHALATION_SPRAY | Freq: Every day | RESPIRATORY_TRACT | 0 refills | Status: AC

## 2024-07-10 NOTE — Assessment & Plan Note (Addendum)
 Last DEXA 2025 - stable osteopenia spine/osteoporosis hip Continue Calcium and vitamin D 

## 2024-07-10 NOTE — Assessment & Plan Note (Signed)
 Managed with diet only. Lab Results  Component Value Date   LDLCALC 149 (H) 07/08/2023

## 2024-07-10 NOTE — Assessment & Plan Note (Signed)
 Will check UA for infection Begin Toviaz 4 mg qAM

## 2024-07-10 NOTE — Assessment & Plan Note (Signed)
 Currently taking TUMS prn with minimal reflux symptoms. Patient denies red flag symptoms - no melena, weight loss, dysphagia. Will maintain current management.

## 2024-07-10 NOTE — Assessment & Plan Note (Signed)
 Monitoring for transition to hypothyroidism Last tsh and T4 were normal.

## 2024-07-10 NOTE — Assessment & Plan Note (Signed)
 Stable on Trelegy daily with prn albuterol 

## 2024-07-10 NOTE — Patient Instructions (Signed)
 Call [facility] to schedule your mammogram at [phone].

## 2024-07-10 NOTE — Progress Notes (Signed)
 Date:  07/10/2024   Name:  Monica Campbell   DOB:  09-10-1940   MRN:  [id number]   Chief Complaint: Annual Exam Monica Campbell is a 83 y.o. female who presents today for her Complete Annual Exam. She feels well. She reports exercising. She reports she is sleeping well. Breast complaints - none.  Health Maintenance  Topic Date Due   Yearly kidney health urinalysis for diabetes  Never done   Zoster (Shingles) Vaccine (1 of 2) 05/30/1960   COVID-19 Vaccine (6 - 2025-26 season) 05/08/2024   DTaP/Tdap/Td vaccine (2 - Td or Tdap) 05/11/2024   Yearly kidney function blood test for diabetes  07/07/2024   Breast Cancer Screening  07/14/2024   Medicare Annual Wellness Visit  05/11/2025   DEXA scan (bone density measurement)  11/16/2025   Pneumococcal Vaccine for age over 85  Completed   Flu Shot  Completed   Meningitis B Vaccine  Aged Out   Hepatitis C Screening  Discontinued   Hyperlipidemia This is a chronic problem. The problem is uncontrolled. Recent lipid tests were reviewed and are high. Associated symptoms include myalgias (discomfort under right shoulder blade). Pertinent negatives include no chest pain or shortness of breath. She is currently on no antihyperlipidemic treatment.  COPD -  stable.  On Trelegy and albuterol  PRN. OP - on Calcium and Vitamin D ; DEXA done recently. OAB - having trouble getting to the BR in time without leaking.  Review of Systems  Constitutional:  Negative for fatigue and unexpected weight change.  HENT:  Negative for trouble swallowing.   Eyes:  Negative for visual disturbance.  Respiratory:  Negative for cough, chest tightness, shortness of breath and wheezing.   Cardiovascular:  Negative for chest pain, palpitations and leg swelling.  Gastrointestinal:  Negative for abdominal pain, constipation and diarrhea.  Genitourinary:  Positive for frequency and urgency.  Musculoskeletal:  Positive for arthralgias (right hip pain), gait problem and  myalgias (discomfort under right shoulder blade).  Skin:  Negative for color change and rash.  Neurological:  Negative for dizziness, weakness, light-headedness and headaches.  Psychiatric/Behavioral:  Negative for dysphoric mood and sleep disturbance. The patient is not nervous/anxious.      Lab Results  Component Value Date   NA 139 07/08/2023   K 4.1 07/08/2023   CO2 26 07/08/2023   GLUCOSE 91 07/08/2023   BUN 13 07/08/2023   CREATININE 0.80 07/08/2023   CALCIUM 9.4 07/08/2023   EGFR 74 07/08/2023   GFRNONAA 71 06/04/2020   Lab Results  Component Value Date   CHOL 239 (H) 07/08/2023   HDL 58 07/08/2023   LDLCALC 149 (H) 07/08/2023   TRIG 177 (H) 07/08/2023   CHOLHDL 4.1 07/08/2023   Lab Results  Component Value Date   TSH 2.730 07/08/2023   No results found for: HGBA1C Lab Results  Component Value Date   WBC 8.2 07/08/2023   HGB 12.9 07/08/2023   HCT 39.2 07/08/2023   MCV 90 07/08/2023   PLT 279 07/08/2023   Lab Results  Component Value Date   ALT 7 07/08/2023   AST 16 07/08/2023   ALKPHOS 69 07/08/2023   BILITOT 0.3 07/08/2023   Lab Results  Component Value Date   VD25OH 39.6 06/06/2021     Patient Active Problem List   Diagnosis Date Noted   OAB (overactive bladder) 07/10/2024   Skin tag of anus 07/08/2023   Lumbar stenosis with neurogenic claudication 06/04/2020   Ankle edema, bilateral 06/04/2020  Senile ecchymosis 06/04/2020   Arthritis of both acromioclavicular joints 09/05/2019   Bursitis of right shoulder 07/05/2019   Venous insufficiency of right leg 06/21/2019   Myalgia 06/21/2019   Vitamin D  deficiency 06/21/2019   Carpal tunnel syndrome 04/10/2019   Acquired trigger finger 04/03/2019   Elevated TSH 05/03/2017   Age-related osteoporosis without current pathological fracture 03/24/2016   Chronic dermatitis 07/09/2015   Allergic rhinitis, seasonal 12/12/2014   Gastro-esophageal reflux disease without esophagitis 12/12/2014   Mixed  hyperlipidemia 12/12/2014   Degenerative arthritis of hip 12/12/2014   Chronic obstructive pulmonary disease, unspecified (HCC) 10/11/2014   Bilateral hearing loss 07/15/2014    Allergies  Allergen Reactions   Augmentin  [Amoxicillin -Pot Clavulanate] Itching    Itching all over.   Codeine Rash    Past Surgical History:  Procedure Laterality Date   CATARACT EXTRACTION W/PHACO Left 02/02/2018   Procedure: CATARACT EXTRACTION PHACO AND INTRAOCULAR LENS PLACEMENT (IOC) LEFT  MALYUGIN;  Surgeon: Mittie Gaskin, MD;  Location: [facility];  Service: Ophthalmology;  Laterality: Left;   CATARACT EXTRACTION W/PHACO Right 05/03/2018   Procedure: CATARACT EXTRACTION PHACO AND INTRAOCULAR LENS PLACEMENT (IOC) RIGHT  IVA/TOPICAL;  Surgeon: Mittie Gaskin, MD;  Location: [facility];  Service: Ophthalmology;  Laterality: Right;   MINOR HEMORRHOIDECTOMY      Social History   Tobacco Use   Smoking status: Former    Current packs/day: 0.00    Average packs/day: 0.5 packs/day for 30.0 years (15.0 ttl pk-yrs)    Types: Cigarettes    Start date: 09/07/1970    Quit date: 09/07/2000    Years since quitting: 23.8   Smokeless tobacco: Never   Tobacco comments:    smoking cessation materials not required  Vaping Use   Vaping status: Never Used  Substance Use Topics   Alcohol use: Yes    Alcohol/week: 1.0 standard drink of alcohol    Types: 1 Glasses of wine per week    Comment: social, 1 glass of wine every 3 or 4 mths   Drug use: No     Medication list has been reviewed and updated.  Current Meds  Medication Sig   acetaminophen  (TYLENOL ) 650 MG CR tablet Take 650 mg by mouth every 8 (eight) hours as needed for pain.   albuterol  (PROVENTIL ) (2.5 MG/3ML) 0.083% nebulizer solution Take 3 mLs by nebulization every 6 (six) hours as needed for wheezing or shortness of breath.   aspirin  81 MG chewable tablet Chew 1 tablet by mouth daily.   blood glucose meter kit and  supplies KIT Dispense based on patient and insurance preference. Use up to four times daily as directed.   Calcium Carbonate-Vit D-Min (CALTRATE 600+D PLUS MINERALS) 600-800 MG-UNIT CHEW Chew 1 tablet by mouth daily.   Cholecalciferol (VITAMIN D ) 125 MCG (5000 UT) CAPS Take 1 capsule by mouth daily.   docusate sodium (COLACE) 100 MG capsule Take 1 capsule by mouth daily.   fesoterodine (TOVIAZ) 4 MG TB24 tablet Take 1 tablet (4 mg total) by mouth daily.   fluticasone  (FLONASE ) 50 MCG/ACT nasal spray Use 2 spray(s) in each nostril once daily   lidocaine  (LIDODERM ) 5 % Place 1 patch onto the skin daily. Remove & Discard patch within 12 hours or as directed by MD   zinc gluconate 50 MG tablet Take 50 mg by mouth daily.   [DISCONTINUED] albuterol  (VENTOLIN  HFA) 108 (90 Base) MCG/ACT inhaler INHALE 2 PUFFS BY MOUTH EVERY 6 HOURS AS NEEDED FOR WHEEZING OR  SHORTNESS  OF  BREATH   [DISCONTINUED] TRELEGY ELLIPTA  100-62.5-25 MCG/ACT AEPB INHALE 1 PUFF INTO THE LUNGS ONCE DAILY       07/10/2024    9:50 AM 06/26/2024    2:10 PM 02/28/2024    9:03 AM 11/26/2023    9:49 AM  GAD 7 : Generalized Anxiety Score  Nervous, Anxious, on Edge 0 0 2 3  Control/stop worrying 0 0 2 3  Worry too much - different things 0 0 2 3  Trouble relaxing 0 0 2 3  Restless 0 0 2 3  Easily annoyed or irritable 0 0 2 3  Afraid - awful might happen 0 0 2 3  Total GAD 7 Score 0 0 14 21  Anxiety Difficulty Not difficult at all Not difficult at all Somewhat difficult        07/10/2024    9:49 AM 06/26/2024    2:09 PM 05/11/2024    4:11 PM  Depression screen PHQ 2/9  Decreased Interest 0 0 0  Down, Depressed, Hopeless 0 0 1  PHQ - 2 Score 0 0 1  Altered sleeping 0 0 0  Tired, decreased energy 0 0 0  Change in appetite 0 0 0  Feeling bad or failure about yourself  0 0 0  Trouble concentrating 0 0 0  Moving slowly or fidgety/restless 0 0 0  Suicidal thoughts 0 0 0  PHQ-9 Score 0 0 1  Difficult doing work/chores Not  difficult at all Not difficult at all Not difficult at all    BP Readings from Last 3 Encounters:  07/10/24 118/62  06/26/24 136/72  02/28/24 124/68    Physical Exam Vitals and nursing note reviewed.  Constitutional:      General: She is not in acute distress.    Appearance: She is well-developed.  HENT:     Head: Normocephalic and atraumatic.     Right Ear: Tympanic membrane and ear canal normal.     Left Ear: Tympanic membrane and ear canal normal.     Nose:     Right Sinus: No maxillary sinus tenderness.     Left Sinus: No maxillary sinus tenderness.  Eyes:     General: No scleral icterus.       Right eye: No discharge.        Left eye: No discharge.     Conjunctiva/sclera: Conjunctivae normal.  Neck:     Thyroid : No thyromegaly.     Vascular: No carotid bruit.  Cardiovascular:     Rate and Rhythm: Normal rate and regular rhythm.     Pulses: Normal pulses.     Heart sounds: Normal heart sounds.  Pulmonary:     Effort: Pulmonary effort is normal. No respiratory distress.     Breath sounds: No wheezing.  Chest:     Chest wall: No mass, deformity or tenderness.  Abdominal:     General: Bowel sounds are normal.     Palpations: Abdomen is soft.     Tenderness: There is no abdominal tenderness.  Musculoskeletal:     Cervical back: Normal range of motion. No erythema.     Right lower leg: No edema.     Left lower leg: No edema.  Lymphadenopathy:     Cervical: No cervical adenopathy.  Skin:    General: Skin is warm and dry.     Capillary Refill: Capillary refill takes less than 2 seconds.     Findings: No rash.  Neurological:     Mental Status:  She is alert and oriented to person, place, and time.     Cranial Nerves: No cranial nerve deficit.     Sensory: No sensory deficit.     Deep Tendon Reflexes: Reflexes are normal and symmetric.  Psychiatric:        Attention and Perception: Attention normal.        Mood and Affect: Mood normal.        Thought Content:  Thought content normal.     Wt Readings from Last 3 Encounters:  07/10/24 157 lb (71.2 kg)  06/26/24 147 lb (66.7 kg)  05/11/24 153 lb (69.4 kg)    BP 118/62   Pulse 73   Ht 5' 2 (1.575 m)   Wt 157 lb (71.2 kg)   SpO2 95%   BMI 28.72 kg/m   Assessment and Plan:  Problem List Items Addressed This Visit       Unprioritized   Gastro-esophageal reflux disease without esophagitis (Chronic)   Currently taking TUMS prn with minimal reflux symptoms. Patient denies red flag symptoms - no melena, weight loss, dysphagia. Will maintain current management.       Mixed hyperlipidemia (Chronic)   Managed with diet only. Lab Results  Component Value Date   LDLCALC 149 (H) 07/08/2023         Relevant Orders   Comprehensive metabolic panel with GFR   Lipid panel   Chronic obstructive pulmonary disease, unspecified (HCC) (Chronic)   Stable on Trelegy daily with prn albuterol       Relevant Medications   Fluticasone -Umeclidin-Vilant (TRELEGY ELLIPTA ) 100-62.5-25 MCG/ACT AEPB   albuterol  (VENTOLIN  HFA) 108 (90 Base) MCG/ACT inhaler   Other Relevant Orders   CBC with Differential/Platelet   Age-related osteoporosis without current pathological fracture (Chronic)   Last DEXA 2025 - stable osteopenia spine/osteoporosis hip Continue Calcium and vitamin D       Elevated TSH   Monitoring for transition to hypothyroidism Last tsh and T4 were normal.      Relevant Orders   TSH + free T4   OAB (overactive bladder) (Chronic)   Will check UA for infection Begin Toviaz 4 mg qAM      Relevant Medications   fesoterodine (TOVIAZ) 4 MG TB24 tablet   Other Relevant Orders   Urinalysis, Routine w reflex microscopic   Other Visit Diagnoses       Annual physical exam    -  Primary   up to date on screenings and immunizations     Encounter for screening mammogram for breast cancer       schedule at [facility]   Relevant Orders   MM 3D SCREENING MAMMOGRAM BILATERAL BREAST     Encounter  for immunization       Relevant Orders   Flu vaccine HIGH DOSE PF(Fluzone Trivalent) (Completed)       Return in about 6 months (around 01/07/2025) for COPD - Dr. Lemon.    Leita HILARIO Adie, MD [facility] [place]

## 2024-07-11 ENCOUNTER — Ambulatory Visit: Payer: Self-pay | Admitting: Internal Medicine

## 2024-07-11 LAB — MICROSCOPIC EXAMINATION
Bacteria, UA: NONE SEEN
Casts: NONE SEEN /LPF
RBC, Urine: NONE SEEN /HPF (ref 0–2)

## 2024-07-11 LAB — TSH+FREE T4
Free T4: 1.12 ng/dL (ref 0.82–1.77)
TSH: 2.53 u[IU]/mL (ref 0.450–4.500)

## 2024-07-11 LAB — CBC WITH DIFFERENTIAL/PLATELET
Basophils Absolute: 0 x10E3/uL (ref 0.0–0.2)
Basos: 0 %
EOS (ABSOLUTE): 0.2 x10E3/uL (ref 0.0–0.4)
Eos: 2 %
Hematocrit: 43.2 % (ref 34.0–46.6)
Hemoglobin: 13.8 g/dL (ref 11.1–15.9)
Immature Grans (Abs): 0 x10E3/uL (ref 0.0–0.1)
Immature Granulocytes: 0 %
Lymphocytes Absolute: 3.6 x10E3/uL — ABNORMAL HIGH (ref 0.7–3.1)
Lymphs: 31 %
MCH: 29.2 pg (ref 26.6–33.0)
MCHC: 31.9 g/dL (ref 31.5–35.7)
MCV: 92 fL (ref 79–97)
Monocytes Absolute: 1 x10E3/uL — ABNORMAL HIGH (ref 0.1–0.9)
Monocytes: 9 %
Neutrophils Absolute: 6.8 x10E3/uL (ref 1.4–7.0)
Neutrophils: 58 %
Platelets: 260 x10E3/uL (ref 150–450)
RBC: 4.72 x10E6/uL (ref 3.77–5.28)
RDW: 12.8 % (ref 11.7–15.4)
WBC: 11.7 x10E3/uL — ABNORMAL HIGH (ref 3.4–10.8)

## 2024-07-11 LAB — URINALYSIS, ROUTINE W REFLEX MICROSCOPIC
Bilirubin, UA: NEGATIVE
Glucose, UA: NEGATIVE
Ketones, UA: NEGATIVE
Nitrite, UA: NEGATIVE
Protein,UA: NEGATIVE
RBC, UA: NEGATIVE
Specific Gravity, UA: 1.017 (ref 1.005–1.030)
Urobilinogen, Ur: 0.2 mg/dL (ref 0.2–1.0)
pH, UA: 6.5 (ref 5.0–7.5)

## 2024-07-11 LAB — COMPREHENSIVE METABOLIC PANEL WITH GFR
ALT: 8 IU/L (ref 0–32)
AST: 18 IU/L (ref 0–40)
Albumin: 4.3 g/dL (ref 3.7–4.7)
Alkaline Phosphatase: 68 IU/L (ref 48–129)
BUN/Creatinine Ratio: 16 (ref 12–28)
BUN: 13 mg/dL (ref 8–27)
Bilirubin Total: 0.5 mg/dL (ref 0.0–1.2)
CO2: 27 mmol/L (ref 20–29)
Calcium: 9.1 mg/dL (ref 8.7–10.3)
Chloride: 99 mmol/L (ref 96–106)
Creatinine, Ser: 0.8 mg/dL (ref 0.57–1.00)
Globulin, Total: 2.6 g/dL (ref 1.5–4.5)
Glucose: 89 mg/dL (ref 70–99)
Potassium: 4.4 mmol/L (ref 3.5–5.2)
Sodium: 137 mmol/L (ref 134–144)
Total Protein: 6.9 g/dL (ref 6.0–8.5)
eGFR: 73 mL/min/1.73 (ref >59)

## 2024-07-11 LAB — LIPID PANEL
Chol/HDL Ratio: 3.5 ratio (ref 0.0–4.4)
Cholesterol, Total: 266 mg/dL — ABNORMAL HIGH (ref 100–199)
HDL: 76 mg/dL (ref >39)
LDL Chol Calc (NIH): 160 mg/dL — ABNORMAL HIGH (ref 0–99)
Triglycerides: 170 mg/dL — ABNORMAL HIGH (ref 0–149)
VLDL Cholesterol Cal: 30 mg/dL (ref 5–40)

## 2024-07-14 DIAGNOSIS — M47816 Spondylosis without myelopathy or radiculopathy, lumbar region: Secondary | ICD-10-CM | POA: Diagnosis not present

## 2024-07-31 DIAGNOSIS — G8929 Other chronic pain: Secondary | ICD-10-CM | POA: Diagnosis not present

## 2024-07-31 DIAGNOSIS — M5416 Radiculopathy, lumbar region: Secondary | ICD-10-CM | POA: Diagnosis not present

## 2024-07-31 DIAGNOSIS — M1611 Unilateral primary osteoarthritis, right hip: Secondary | ICD-10-CM | POA: Diagnosis not present

## 2024-07-31 DIAGNOSIS — M5441 Lumbago with sciatica, right side: Secondary | ICD-10-CM | POA: Diagnosis not present

## 2024-07-31 DIAGNOSIS — M5414 Radiculopathy, thoracic region: Secondary | ICD-10-CM | POA: Diagnosis not present

## 2024-08-28 ENCOUNTER — Ambulatory Visit: Payer: Self-pay

## 2024-08-28 NOTE — Telephone Encounter (Signed)
 FYI Only or Action Required?: FYI only for provider: appointment scheduled on 08/29/24.  Patient was last seen in primary care on 07/10/2024 by Justus Leita DEL, MD.  Called Nurse Triage reporting Dysuria.  Symptoms began 5 days ago.  Interventions attempted: Nothing.  Symptoms are: gradually worsening.  Triage Disposition: See Physician Within 24 Hours  Patient/caregiver understands and will follow disposition?: Yes              Copied from CRM #[id number]. Topic: Clinical - Red Word Triage >> Aug 28, 2024  1:37 PM Mia F wrote: Red Word that prompted transfer to Nurse Triage: Possible UTI. Incontenense. When urinating she feel a raw/burning sensation. Has been going on for 3-4 days. She says she takes fesoterodine  (TOVIAZ ) 4 MG TB24 tablet and she wonders if that could be the reason for the symptoms. Reason for Disposition  Age > 50 years  Answer Assessment - Initial Assessment Questions 1. SEVERITY: How bad is the pain?  (e.g., Scale 1-10; mild, moderate, or severe)     2-3/10.  2. FREQUENCY: How many times have you had painful urination today?      3-4 times.  3. PATTERN: Is pain present every time you urinate or just sometimes?      More often now, because it irritated me  4. ONSET: When did the painful urination start?      4-5 days.  5. FEVER: Do you have a fever? If Yes, ask: What is your temperature, how was it measured, and when did it start?     No.  6. PAST UTI: Have you had a urine infection before? If Yes, ask: When was the last time? and What happened that time?      Yes, years ago.  7. CAUSE: What do you think is causing the painful urination?  (e.g., UTI, scratch, Herpes sore)     UTI. She states she thinks this could be due to the soap she uses or her new body spray.  8. OTHER SYMPTOMS: Do you have any other symptoms? (e.g., blood in urine, flank pain, genital sores, urgency, vaginal discharge)     She states she has  urinary frequency and dribble/urinary incontinence when coughing or sneezing and at night sometimes x years. No flank pain, new back pain, blood in urine, nausea, vomiting.  Protocols used: Urination Pain - Female-A-AH

## 2024-08-28 NOTE — Telephone Encounter (Signed)
Noted  Pt has an appt  KP 

## 2024-08-29 ENCOUNTER — Ambulatory Visit: Admitting: Internal Medicine

## 2024-08-29 ENCOUNTER — Encounter: Payer: Self-pay | Admitting: Internal Medicine

## 2024-08-29 VITALS — BP 128/60 | HR 72 | Ht 61.0 in | Wt 155.0 lb

## 2024-08-29 DIAGNOSIS — R300 Dysuria: Secondary | ICD-10-CM

## 2024-08-29 LAB — POCT URINE DIPSTICK
Bilirubin, UA: NEGATIVE
Blood, UA: NEGATIVE
Glucose, UA: NEGATIVE mg/dL
Ketones, POC UA: NEGATIVE mg/dL
Leukocytes, UA: NEGATIVE
Nitrite, UA: NEGATIVE
POC PROTEIN,UA: NEGATIVE
Spec Grav, UA: 1.015
Urobilinogen, UA: NEGATIVE U/dL — AB
pH, UA: 7

## 2024-08-29 MED ORDER — ESTRADIOL 0.01 % VA CREA: TOPICAL_CREAM | VAGINAL | 0 refills | Status: AC

## 2024-08-29 NOTE — Progress Notes (Signed)
 "   Date:  08/29/2024   Name:  Monica Campbell   DOB:  [birth year]   MRN:  [id number]   Chief Complaint: Follow-up (Burning with urination, vaginal irritation.--4-5 days)  Dysuria  This is a new problem. The current episode started in the past 7 days. The problem occurs every urination. The quality of the pain is described as burning. The pain is mild. Pertinent negatives include no chills, discharge, flank pain, frequency, hematuria, hesitancy, sweats or urgency. She has tried increased fluids for the symptoms. The treatment provided no relief.    Review of Systems  Constitutional:  Negative for chills, fatigue and fever.  Respiratory:  Negative for chest tightness, shortness of breath and wheezing.   Cardiovascular:  Negative for chest pain.  Genitourinary:  Positive for dysuria. Negative for flank pain, frequency, hematuria, hesitancy and urgency.  Neurological:  Negative for dizziness and headaches.     Lab Results  Component Value Date   NA 137 07/10/2024   K 4.4 07/10/2024   CO2 27 07/10/2024   GLUCOSE 89 07/10/2024   BUN 13 07/10/2024   CREATININE 0.80 07/10/2024   CALCIUM 9.1 07/10/2024   EGFR 73 07/10/2024   GFRNONAA 71 06/04/2020   Lab Results  Component Value Date   CHOL 266 (H) 07/10/2024   HDL 76 07/10/2024   LDLCALC 160 (H) 07/10/2024   TRIG 170 (H) 07/10/2024   CHOLHDL 3.5 07/10/2024   Lab Results  Component Value Date   TSH 2.530 07/10/2024   No results found for: HGBA1C Lab Results  Component Value Date   WBC 11.7 (H) 07/10/2024   HGB 13.8 07/10/2024   HCT 43.2 07/10/2024   MCV 92 07/10/2024   PLT 260 07/10/2024   Lab Results  Component Value Date   ALT 8 07/10/2024   AST 18 07/10/2024   ALKPHOS 68 07/10/2024   BILITOT 0.5 07/10/2024   Lab Results  Component Value Date   VD25OH 39.6 06/06/2021     Patient Active Problem List   Diagnosis Date Noted   OAB (overactive bladder) 07/10/2024   Skin tag of anus 07/08/2023   Lumbar  stenosis with neurogenic claudication 06/04/2020   Ankle edema, bilateral 06/04/2020   Senile ecchymosis 06/04/2020   Arthritis of both acromioclavicular joints 09/05/2019   Bursitis of right shoulder 07/05/2019   Venous insufficiency of right leg 06/21/2019   Myalgia 06/21/2019   Vitamin D  deficiency 06/21/2019   Carpal tunnel syndrome 04/10/2019   Acquired trigger finger 04/03/2019   Elevated TSH 05/03/2017   Age-related osteoporosis without current pathological fracture 03/24/2016   Chronic dermatitis 07/09/2015   Allergic rhinitis, seasonal 12/12/2014   Gastro-esophageal reflux disease without esophagitis 12/12/2014   Mixed hyperlipidemia 12/12/2014   Degenerative arthritis of hip 12/12/2014   Chronic obstructive pulmonary disease, unspecified (HCC) 10/11/2014   Bilateral hearing loss 07/15/2014    Allergies[1]  Past Surgical History:  Procedure Laterality Date   CATARACT EXTRACTION W/PHACO Left 02/02/2018   Procedure: CATARACT EXTRACTION PHACO AND INTRAOCULAR LENS PLACEMENT (IOC) LEFT  MALYUGIN;  Surgeon: Mittie Gaskin, MD;  Location: [facility];  Service: Ophthalmology;  Laterality: Left;   CATARACT EXTRACTION W/PHACO Right 05/03/2018   Procedure: CATARACT EXTRACTION PHACO AND INTRAOCULAR LENS PLACEMENT (IOC) RIGHT  IVA/TOPICAL;  Surgeon: Mittie Gaskin, MD;  Location: [facility];  Service: Ophthalmology;  Laterality: Right;   MINOR HEMORRHOIDECTOMY      Social History[2]   Medication list has been reviewed and updated.  Active Medications[3]  08/29/2024    2:28 PM 07/10/2024    9:50 AM 06/26/2024    2:10 PM 02/28/2024    9:03 AM  GAD 7 : Generalized Anxiety Score  Nervous, Anxious, on Edge 0 0 0 2  Control/stop worrying 0 0 0 2  Worry too much - different things  0 0 2  Trouble relaxing 0 0 0 2  Restless 0 0 0 2  Easily annoyed or irritable 0 0 0 2  Afraid - awful might happen 0 0 0 2  Total GAD 7 Score  0 0 14  Anxiety  Difficulty Not difficult at all Not difficult at all Not difficult at all Somewhat difficult       08/29/2024    2:27 PM 07/10/2024    9:49 AM 06/26/2024    2:09 PM  Depression screen PHQ 2/9  Decreased Interest 0 0 0  Down, Depressed, Hopeless 0 0 0  PHQ - 2 Score 0 0 0  Altered sleeping 0 0 0  Tired, decreased energy 0 0 0  Change in appetite 0 0 0  Feeling bad or failure about yourself  0 0 0  Trouble concentrating 0 0 0  Moving slowly or fidgety/restless 0 0 0  Suicidal thoughts 0 0 0  PHQ-9 Score 0 0  0   Difficult doing work/chores Not difficult at all Not difficult at all Not difficult at all     Data saved with a previous flowsheet row definition    BP Readings from Last 3 Encounters:  08/29/24 128/60  07/10/24 118/62  06/26/24 136/72    Physical Exam Vitals and nursing note reviewed.  Constitutional:      General: She is not in acute distress.    Appearance: She is well-developed.  HENT:     Head: Normocephalic and atraumatic.  Cardiovascular:     Rate and Rhythm: Normal rate and regular rhythm.  Pulmonary:     Effort: Pulmonary effort is normal. No respiratory distress.     Breath sounds: No wheezing or rhonchi.  Abdominal:     Palpations: Abdomen is soft.     Tenderness: There is no abdominal tenderness. There is no right CVA tenderness, left CVA tenderness or guarding.  Skin:    General: Skin is warm and dry.     Findings: No rash.  Neurological:     Mental Status: She is alert and oriented to person, place, and time.  Psychiatric:        Mood and Affect: Mood normal.        Behavior: Behavior normal.    Lab Results  Component Value Date   COLORU yellow 08/29/2024   CLARITYU clear 08/29/2024   GLUCOSEUR negative 08/29/2024   BILIRUBINUR negative 08/29/2024   KETONESU Negative 07/10/2024   SPECGRAV 1.015 08/29/2024   RBCUR negative 08/29/2024   PHUR 7.0 08/29/2024   PROTEINUR Negative 07/10/2024   UROBILINOGEN negative (A) 08/29/2024    LEUKOCYTESUR Negative 08/29/2024     Wt Readings from Last 3 Encounters:  08/29/24 155 lb (70.3 kg)  07/10/24 157 lb (71.2 kg)  06/26/24 147 lb (66.7 kg)    BP 128/60   Pulse 72   Ht 5' 1 (1.549 m)   Wt 155 lb (70.3 kg)   SpO2 97%   BMI 29.29 kg/m   Assessment and Plan:  Problem List Items Addressed This Visit   None Visit Diagnoses       Dysuria    -  Primary  urine negative  suspect mild vaginal and genital atrophy so will try topical estrogen   Relevant Medications   estradiol  (ESTRACE ) 0.01 % CREA vaginal cream   Other Relevant Orders   POCT URINE DIPSTICK (Completed)       No follow-ups on file.    Leita HILARIO Adie, MD [facility] [place]           [1]  Allergies Allergen Reactions   Augmentin  [Amoxicillin -Pot Clavulanate] Itching    Itching all over.   Codeine Rash  [2]  Social History Tobacco Use   Smoking status: Former    Current packs/day: 0.00    Average packs/day: 0.5 packs/day for 30.0 years (15.0 ttl pk-yrs)    Types: Cigarettes    Start date: 09/07/1970    Quit date: 09/07/2000    Years since quitting: 23.9   Smokeless tobacco: Never   Tobacco comments:    smoking cessation materials not required  Vaping Use   Vaping status: Never Used  Substance Use Topics   Alcohol use: Yes    Alcohol/week: 1.0 standard drink of alcohol    Types: 1 Glasses of wine per week    Comment: social, 1 glass of wine every 3 or 4 mths   Drug use: No  [3]  Current Meds  Medication Sig   acetaminophen  (TYLENOL ) 650 MG CR tablet Take 650 mg by mouth every 8 (eight) hours as needed for pain.   albuterol  (PROVENTIL ) (2.5 MG/3ML) 0.083% nebulizer solution Take 3 mLs by nebulization every 6 (six) hours as needed for wheezing or shortness of breath.   albuterol  (VENTOLIN  HFA) 108 (90 Base) MCG/ACT inhaler Inhale 2 puffs into the lungs every 6 (six) hours as needed for wheezing or shortness of breath.   aspirin  81 MG  chewable tablet Chew 1 tablet by mouth daily.   blood glucose meter kit and supplies KIT Dispense based on patient and insurance preference. Use up to four times daily as directed.   Calcium Carbonate-Vit D-Min (CALTRATE 600+D PLUS MINERALS) 600-800 MG-UNIT CHEW Chew 1 tablet by mouth daily.   Cholecalciferol (VITAMIN D ) 125 MCG (5000 UT) CAPS Take 1 capsule by mouth daily.   docusate sodium (COLACE) 100 MG capsule Take 1 capsule by mouth daily.   estradiol  (ESTRACE ) 0.01 % CREA vaginal cream Fingertip amount to urethral opening nightly   fesoterodine  (TOVIAZ ) 4 MG TB24 tablet Take 1 tablet (4 mg total) by mouth daily.   fluticasone  (FLONASE ) 50 MCG/ACT nasal spray Use 2 spray(s) in each nostril once daily   Fluticasone -Umeclidin-Vilant (TRELEGY ELLIPTA ) 100-62.5-25 MCG/ACT AEPB Inhale 1 Inhalation into the lungs daily at 6 (six) AM.   lidocaine  (LIDODERM ) 5 % Place 1 patch onto the skin daily. Remove & Discard patch within 12 hours or as directed by MD   zinc gluconate 50 MG tablet Take 50 mg by mouth daily.   "

## 2024-09-27 ENCOUNTER — Telehealth: Payer: Self-pay

## 2024-09-27 NOTE — Telephone Encounter (Signed)
 Copied from CRM #[id number]. Topic: Clinical - Medication Question >> Sep 26, 2024  4:42 PM Monica Campbell wrote: Reason for CRM: patient would like for her pcp to call in ipratropium 0.03% nasal spray. Not on med list. Please call patient if it can be filled. She called [facility] to refill this medication,

## 2024-09-27 NOTE — Telephone Encounter (Signed)
 Please review recent call

## 2024-09-28 NOTE — Telephone Encounter (Signed)
 Patient was contacted via phone. Information was relayed the information from Dr. Lemon. Patient verbalized understanding.

## 2025-01-22 ENCOUNTER — Encounter: Admitting: Student
